# Patient Record
Sex: Male | Born: 1950 | Race: White | Hispanic: No | Smoking: Former smoker
Health system: Southern US, Community
[De-identification: ages and names within clinical notes are randomized; demographics above are authoritative.]

## PROBLEM LIST (undated history)

## (undated) DIAGNOSIS — K219 Gastro-esophageal reflux disease without esophagitis: Secondary | ICD-10-CM

## (undated) DIAGNOSIS — N289 Disorder of kidney and ureter, unspecified: Secondary | ICD-10-CM

## (undated) DIAGNOSIS — M1990 Unspecified osteoarthritis, unspecified site: Secondary | ICD-10-CM

## (undated) DIAGNOSIS — Z87442 Personal history of urinary calculi: Secondary | ICD-10-CM

## (undated) DIAGNOSIS — J45909 Unspecified asthma, uncomplicated: Secondary | ICD-10-CM

## (undated) DIAGNOSIS — E785 Hyperlipidemia, unspecified: Secondary | ICD-10-CM

## (undated) DIAGNOSIS — I8390 Asymptomatic varicose veins of unspecified lower extremity: Secondary | ICD-10-CM

## (undated) HISTORY — PX: VARICOSE VEIN SURGERY: SHX832

## (undated) HISTORY — PX: VASECTOMY: SHX75

## (undated) HISTORY — PX: HERNIA REPAIR: SHX51

## (undated) HISTORY — PX: TONSILLECTOMY: SUR1361

---

## 2000-07-22 ENCOUNTER — Ambulatory Visit (HOSPITAL_COMMUNITY): Admission: RE | Admit: 2000-07-22 | Discharge: 2000-07-22 | Payer: Self-pay | Admitting: Gastroenterology

## 2002-03-12 ENCOUNTER — Emergency Department (HOSPITAL_COMMUNITY): Admission: EM | Admit: 2002-03-12 | Discharge: 2002-03-12 | Payer: Self-pay | Admitting: Emergency Medicine

## 2003-08-23 ENCOUNTER — Ambulatory Visit (HOSPITAL_COMMUNITY): Admission: RE | Admit: 2003-08-23 | Discharge: 2003-08-23 | Payer: Self-pay | Admitting: Family Medicine

## 2007-01-06 ENCOUNTER — Ambulatory Visit: Payer: Self-pay | Admitting: Vascular Surgery

## 2010-09-03 NOTE — Consult Note (Signed)
NEW PATIENT CONSULTATION   Ralph, [name]  DOB:  08/07/1950                                       01/06/2007  ZOXWR#:[id number]   Ralph [name] presents today for continued evaluation of his venous  pathology.  He was seen in our office by Dr. Fabienne [name] in June 2005  with an episode of superficial thrombophlebitis in his left calf.  He  initially had no significant discomfort related to this but now is  having progressed marked discomfort in both lower extremities.  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  This is interfering with his  ability to work.  He reports that he also has a separate symptom of  burning and pins and needles sensation in the soles of his feet, but it  is worse when he first awakens in the morning.  I discussed this with  him and explained that this would not be typical of venous disease and  would not suspect improvement in this sensation with treatment.  He does  have edema in both ankles and does not have any ulceration and does have  a history of superficial thrombophlebitis in his left calf.  He elevates  his legs when possible and does take aspirin for pain.  His medical  history is otherwise unremarkable.  He does not have any current  medications and no major medical difficulties.  He does have a history  of abdominal hernia repair.   FAMILY HISTORY:  Significant for varicose veins with bilateral stripping  in his mother.   SOCIAL HISTORY:  He is single with one child.  He works as a Production designer, theatre/television/film.  Does smoke half pack of cigarettes per day.  Does not drink alcohol on a  regular basis.   REVIEW OF SYSTEMS:  Completely negative except for the leg symptoms we  have described.   PHYSICAL EXAMINATION:  Well-developed, well-nourished white male  appearing his stated age of 84.   Blood pressure is 138/89.  Pulse 78.  Respirations 16.  His radial pulses are 2+ bilaterally.  He has 2+  posterior tibial pulses bilaterally.  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  He does not have any skin changes.  He underwent a  handheld duplex by me showing reflux in the saphenous veins bilaterally.  I discussed options with Ralph [name].  I have recommended that we  proceed with placement of thigh-high20-30 mmhg  compression stockings to  determine if this will give him adequate relief of his venous  hypertension.  I plan to see him again in three months with formal  venous duplex at that time for continued evaluation as well.  He  understands treatment of laser ablation and stab phlebectomy, and we  will recommend this should he fail conservative treatment.   Ralph [name], M.D.  Electronically Signed   TFE/MEDQ  D:  01/06/2007  T:  01/07/2007  Job:  [id number]   cc:   Ralph [name], M.D.

## 2010-09-06 NOTE — Procedures (Signed)
[facility]  Patient:    Ralph [name], Ralph [name]              MRN: [id number] Proc. Date: 07/22/00 Attending:  Verlin Grills, M.D. CC:         Vikki Ports, M.D.                           Procedure Report  PROCEDURE:  Colonoscopy and polypectomy.  INDICATION:  Ralph [name] (date of birth 1950/04/25) is a 60 year old male.  Ralph [name] has recovered from a bout of diverticulitis. He also reports intermittent hematochezia.  I discussed with Ralph [name] the complications associated with colonoscopy and polypectomy including intestinal bleeding and intestinal perforation.  Ralph [name] has signed the operative permit.  ENDOSCOPIST:  Verlin Grills, M.D.  PREMEDICATION:  Versed 10 mg, Demerol 100 mg.  ENDOSCOPE:  Olympus pediatric colonoscope  DESCRIPTION OF PROCEDURE:  After obtaining informed consent, the patient was placed in the left lateral decubitus position.  I administered intravenous Demerol and intravenous Versed to achieve conscious sedation for the procedure.  The patients blood pressure, oxygen saturation, and cardiac rhythm were monitored throughout the procedure and documented in the medical record.  Anal inspection was normal.  Digital rectal examination was normal.  The prostate was non-nodular.  The Olympus pediatric video colonoscope was introduced into the rectum and, under direct vision, advanced to the cecum as identified by normal-appearing ileocecal valve.  The ileocecal valve was intubated and the distal ileum inspected.  Colonic preparation for the exam today was satisfactory.  Rectum: Large, nonbleeding internal hemorrhoids.  No rectal neoplasia noted. Sigmoid Colon and Descending Colon: Left colonic diverticulosis without diverticulitis or stricture.  Splenic Flexure: Normal.  Transverse Colon: Normal.  Hepatic Flexure: Normal.  Ascending Colon: Normal.  Cecum and Ileocecal Valve:  Normal.  ASSESSMENT: 1. Large, nonbleeding intgernal hemorrhoids. 2. Left colonic diverticulosis. 3. No endoscopic evidence for the presence of colorectal neoplasia. DD:  07/22/00 TD:  07/22/00 Job: [id number]

## 2011-01-14 ENCOUNTER — Emergency Department (HOSPITAL_COMMUNITY)
Admission: EM | Admit: 2011-01-14 | Discharge: 2011-01-14 | Disposition: A | Discharge status: Home / Self Care | Payer: PRIVATE HEALTH INSURANCE | Attending: Emergency Medicine | Admitting: Emergency Medicine

## 2011-01-14 DIAGNOSIS — M7989 Other specified soft tissue disorders: Secondary | ICD-10-CM | POA: Insufficient documentation

## 2011-01-14 DIAGNOSIS — T63461A Toxic effect of venom of wasps, accidental (unintentional), initial encounter: Secondary | ICD-10-CM | POA: Insufficient documentation

## 2011-01-14 DIAGNOSIS — T6391XA Toxic effect of contact with unspecified venomous animal, accidental (unintentional), initial encounter: Secondary | ICD-10-CM | POA: Insufficient documentation

## 2011-03-20 ENCOUNTER — Emergency Department (HOSPITAL_COMMUNITY)
Admission: EM | Admit: 2011-03-20 | Discharge: 2011-03-20 | Disposition: A | Discharge status: Home / Self Care | Payer: PRIVATE HEALTH INSURANCE | Attending: Emergency Medicine | Admitting: Emergency Medicine

## 2011-03-20 ENCOUNTER — Encounter: Payer: Self-pay | Admitting: Emergency Medicine

## 2011-03-20 ENCOUNTER — Emergency Department (HOSPITAL_COMMUNITY): Payer: PRIVATE HEALTH INSURANCE

## 2011-03-20 ENCOUNTER — Other Ambulatory Visit: Payer: Self-pay

## 2011-03-20 DIAGNOSIS — K219 Gastro-esophageal reflux disease without esophagitis: Secondary | ICD-10-CM | POA: Insufficient documentation

## 2011-03-20 DIAGNOSIS — R109 Unspecified abdominal pain: Secondary | ICD-10-CM | POA: Insufficient documentation

## 2011-03-20 DIAGNOSIS — R110 Nausea: Secondary | ICD-10-CM | POA: Insufficient documentation

## 2011-03-20 DIAGNOSIS — N201 Calculus of ureter: Secondary | ICD-10-CM | POA: Insufficient documentation

## 2011-03-20 HISTORY — DX: Gastro-esophageal reflux disease without esophagitis: K21.9

## 2011-03-20 LAB — URINALYSIS, ROUTINE W REFLEX MICROSCOPIC
Bilirubin Urine: NEGATIVE
Nitrite: NEGATIVE
Protein, ur: NEGATIVE mg/dL
Urobilinogen, UA: 0.2 mg/dL (ref 0.0–1.0)

## 2011-03-20 LAB — COMPREHENSIVE METABOLIC PANEL
ALT: 46 U/L (ref 0–53)
Alkaline Phosphatase: 59 U/L (ref 39–117)
BUN: 13 mg/dL (ref 6–23)
CO2: 24 mEq/L (ref 19–32)
Calcium: 8.9 mg/dL (ref 8.4–10.5)
GFR calc Af Amer: 75 mL/min — ABNORMAL LOW (ref >90)
GFR calc non Af Amer: 65 mL/min — ABNORMAL LOW (ref >90)
Glucose, Bld: 129 mg/dL — ABNORMAL HIGH (ref 70–99)
Sodium: 139 mEq/L (ref 135–145)

## 2011-03-20 LAB — CBC
HCT: 43 % (ref 39.0–52.0)
Hemoglobin: 14.9 g/dL (ref 13.0–17.0)
MCH: 35 pg — ABNORMAL HIGH (ref 26.0–34.0)
MCHC: 34.7 g/dL (ref 30.0–36.0)
RBC: 4.26 MIL/uL (ref 4.22–5.81)

## 2011-03-20 LAB — LIPASE, BLOOD: Lipase: 31 U/L (ref 11–59)

## 2011-03-20 MED ORDER — ONDANSETRON HCL 4 MG/2ML IJ SOLN
4.0000 mg | Freq: Once | INTRAMUSCULAR | Status: AC
  Administered 2011-03-20: 4 mg via INTRAVENOUS
  Filled 2011-03-20: qty 2

## 2011-03-20 MED ORDER — ONDANSETRON HCL 4 MG/2ML IJ SOLN
INTRAMUSCULAR | Status: AC
  Administered 2011-03-20: 4 mg via INTRAVENOUS
  Filled 2011-03-20: qty 2

## 2011-03-20 MED ORDER — IBUPROFEN 600 MG PO TABS: 600.0000 mg | ORAL_TABLET | Freq: Three times a day (TID) | ORAL | Status: DC | PRN

## 2011-03-20 MED ORDER — HYDROMORPHONE HCL PF 1 MG/ML IJ SOLN
INTRAMUSCULAR | Status: AC
  Administered 2011-03-20: 1 mg
  Filled 2011-03-20: qty 1

## 2011-03-20 MED ORDER — ONDANSETRON HCL 4 MG/2ML IJ SOLN
4.0000 mg | Freq: Once | INTRAMUSCULAR | Status: AC
  Administered 2011-03-20: 4 mg via INTRAVENOUS

## 2011-03-20 MED ORDER — OXYCODONE-ACETAMINOPHEN 5-325 MG PO TABS: 1.0000 | ORAL_TABLET | ORAL | Status: AC | PRN

## 2011-03-20 MED ORDER — HYDROMORPHONE HCL PF 2 MG/ML IJ SOLN: 2.0000 mg | Freq: Once | INTRAMUSCULAR | Status: DC

## 2011-03-20 MED ORDER — HYDROMORPHONE HCL PF 2 MG/ML IJ SOLN
2.0000 mg | Freq: Once | INTRAMUSCULAR | Status: AC
  Administered 2011-03-20: 2 mg via INTRAVENOUS
  Filled 2011-03-20: qty 1

## 2011-03-20 MED ORDER — IOHEXOL 300 MG/ML  SOLN
100.0000 mL | Freq: Once | INTRAMUSCULAR | Status: AC | PRN
  Administered 2011-03-20: 100 mL via INTRAVENOUS

## 2011-03-20 MED ORDER — SODIUM CHLORIDE 0.9 % IV BOLUS (SEPSIS)
1000.0000 mL | Freq: Once | INTRAVENOUS | Status: AC
  Administered 2011-03-20: 1000 mL via INTRAVENOUS

## 2011-03-20 MED ORDER — TAMSULOSIN HCL 0.4 MG PO CAPS: 0.4000 mg | ORAL_CAPSULE | Freq: Every day | ORAL | Status: DC

## 2011-03-20 MED ORDER — KETOROLAC TROMETHAMINE 30 MG/ML IJ SOLN
30.0000 mg | Freq: Once | INTRAMUSCULAR | Status: AC
  Administered 2011-03-20: 30 mg via INTRAVENOUS
  Filled 2011-03-20: qty 1

## 2011-03-20 NOTE — ED Notes (Signed)
Notified pt that we needed urine sample

## 2011-03-20 NOTE — ED Notes (Signed)
Pt has had n/v with abd pain around 9pm. Pt has had some constipation with little BMs noted. Pt pain cont. To increase with tenderness noted. Pt pain is 2in above navel. No mass noted.

## 2011-03-20 NOTE — ED Notes (Signed)
Notified CT scan that pt is finished with contrast

## 2011-03-20 NOTE — ED Provider Notes (Signed)
History     CSN: [id number] Arrival date & time: 03/20/2011 12:58 AM   First MD Initiated Contact with Patient 03/20/11 0101      Chief Complaint  Patient presents with  . Abdominal Pain    (Consider location/radiation/quality/duration/timing/severity/associated sxs/prior treatment) Patient is a 60 y.o. male presenting with abdominal pain. The history is provided by the patient.  Abdominal Pain The primary symptoms of the illness include abdominal pain.   patient reports acute onset abdominal pain that began this evening at approximately 9 PM.  He reports he was in his upper abdomen.  Many nauseated however he did not vomit.  She denies diarrhea.  He reports ongoing pain in his abdomen it comes in waves.  He also reports new discomfort with urination.  He denies back pain flank pain.  He has undergone a recent manipulation of his back after a fall approximately 5 days ago.  Reports he's never had abdominal pain like this before.  Nothing seems to worsen.  Nothing improves his pain.  His symptoms are constant.  He denies fevers and chills.  He denies chest pain shortness of breath.  He's never had abdominal surgery  Past Medical History  Diagnosis Date  . GERD (gastroesophageal reflux disease)     History reviewed. No pertinent past surgical history.  History reviewed. No pertinent family history.  History  Substance Use Topics  . Smoking status: Not on file  . Smokeless tobacco: Not on file  . Alcohol Use:       Review of Systems  Gastrointestinal: Positive for abdominal pain.  All other systems reviewed and are negative.    Allergies  Morphine and related  Home Medications   Current Outpatient Rx  Name Route Sig Dispense Refill  . CIMETIDINE 400 MG PO TABS Oral Take 400 mg by mouth 2 (two) times daily.        BP 148/89  Pulse 84  Temp(Src) 97.7 F (36.5 C) (Oral)  Resp 14  Ht 6\' 1"  (1.854 m)  Wt 195 lb (88.451 kg)  BMI 25.73 kg/m2  SpO2 96%  Physical  Exam  Nursing note and vitals reviewed. Constitutional: He is oriented to person, place, and time. He appears well-developed and well-nourished.  HENT:  Head: Normocephalic and atraumatic.  Eyes: EOM are normal.  Neck: Normal range of motion.  Cardiovascular: Normal rate, regular rhythm, normal heart sounds and intact distal pulses.   Pulmonary/Chest: Effort normal and breath sounds normal. No respiratory distress.  Abdominal: Soft. He exhibits no distension. There is no rebound and no guarding.       Mild epigastric tenderness.  No tenderness of his right upper quadrant  Musculoskeletal: Normal range of motion.  Neurological: He is alert and oriented to person, place, and time.  Skin: Skin is warm and dry.  Psychiatric: He has a normal mood and affect. Judgment normal.    ED Course  [place] bedside Performed by: Lyanne Co Authorized by: Lyanne Co Comments: Bedside ultrasound of his abdominal aorta demonstrates normal caliber and both transverse and sagittal views from the diaphragm to the bifurcation.  Maximal diameter is 2.7 x 2.5 cm.  There is no evidence of abdominal aneurysm on bedside ultrasound.  Indication: abdominal pain.  Probe: Curvilinear.  Images unable to be archived   (including critical care time)  Date: 03/20/2011  Rate: 82  Rhythm: normal sinus rhythm  QRS Axis: normal  Intervals: normal  ST/T Wave abnormalities: normal  Conduction Disutrbances:none  Narrative Interpretation:  Old EKG Reviewed: No prior EKG available    Labs Reviewed  CBC - Abnormal; Notable for the following:    WBC 15.3 (*)    MCV 100.9 (*)    MCH 35.0 (*)    All other components within normal limits  COMPREHENSIVE METABOLIC PANEL - Abnormal; Notable for the following:    Potassium 3.4 (*)    Glucose, Bld 129 (*)    AST 46 (*)    GFR calc non Af Amer 65 (*)    GFR calc Af Amer 75 (*)    All other components within normal limits  URINALYSIS, ROUTINE W REFLEX MICROSCOPIC -  Abnormal; Notable for the following:    Hgb urine dipstick SMALL (*)    Ketones, ur 15 (*)    All other components within normal limits  LIPASE, BLOOD  TROPONIN I  URINE MICROSCOPIC-ADD ON  URINE CULTURE   Ct Abdomen Pelvis W Contrast  03/20/2011  *RADIOLOGY REPORT*  Clinical Data: Abdominal pain, back pain, nausea and vomiting.  CT ABDOMEN AND PELVIS WITH CONTRAST  Technique:  Multidetector CT imaging of the abdomen and pelvis was performed following the standard protocol during bolus administration of intravenous contrast.  Contrast: OMNIPAQUE IOHEXOL 300 MG/ML IV SOLN  Comparison: None.  Findings: Bibasilar airspace opacities likely reflect atelectasis.  The liver and spleen are unremarkable in appearance.  The gallbladder is within normal limits.  The pancreas and adrenal glands are unremarkable.  There is mild right-sided hydronephrosis, with significant right- sided perinephric stranding and fluid, and prominence of the right ureter to the level of an obstructing distal 3 mm stone at the right vesicoureteral junction.  The degree of perinephric stranding and mild decrease in right renal enhancement raises question for mild pyelonephritis.  A 2 mm stone is noted at the interpole region of the right kidney.  No additional nonobstructing renal stones are seen.  Minimal nonspecific left-sided perinephric stranding is seen.  No free fluid is identified.  The small bowel is unremarkable in appearance.  The stomach is filled with contrast and is within normal limits.  No acute vascular abnormalities are seen.  Minimal scattered calcification is noted along the abdominal aorta and its branches.  The appendix in caliber and contains air, without evidence for appendicitis.  Diffuse diverticulosis is noted along the entirety of the colon, with relative sparing of the transverse colon.  There is no evidence of acute diverticulitis.  The bladder is mildly distended and grossly unremarkable in appearance.   There is slight impression on the base of the bladder by the prostate; the prostate is borderline prominent, measuring 4.8 cm in transverse dimension.  Scattered calcifications along the left inguinal region may reflect prior surgery.  No inguinal lymphadenopathy is seen.  No acute osseous abnormalities are identified.  IMPRESSION:  1.  Mild right-sided hydronephrosis, with an obstructing 3 mm stone seen distally at the right vesicoureteral junction.  2.  Significant right-sided perinephric stranding and fluid, and mild decrease in right renal enhancement, raising question for mild pyelonephritis. 3.  2 mm nonobstructing stone noted at the interpole region of the right kidney. 4.  Diffuse diverticulosis noted along the entirety of the colon, with mild sparing along the transverse colon; no evidence of diverticulitis. 5.  Borderline prominence of the prostate. 6.  Bibasilar airspace opacities likely reflect atelectasis.  Original Report Authenticated By: Tonia Ghent, M.D.   I personally reviewed his CT scan  1. Right ureteral stone  MDM  The patient does drink 1-2 beers daily.  Given his upper abdominal pain and concern for possible pancreatitis was present.  His lipase is normal.  His LFTs are normal.  His EKG is normal sinus rhythm.  Doubt ACS or PE.  CT scan pending at this time to further evaluate.  Concerns include transverse colonic diverticulitis versus cholelithiasis versus gastritis versus peptic ulcer disease versus perforated ulcer.  .5:00 AM The patient feels much better at this time.  Adjust oral be given.  His had no fever or chills.  Urine cultures been sent.  Close followup with the urologist.        Lyanne Co, MD 03/20/11 [phone]

## 2011-03-20 NOTE — ED Notes (Signed)
Patient transported to CT 

## 2011-03-21 ENCOUNTER — Observation Stay (HOSPITAL_COMMUNITY)
Admission: EM | Admit: 2011-03-21 | Discharge: 2011-03-22 | Disposition: A | Discharge status: Home / Self Care | Payer: PRIVATE HEALTH INSURANCE | Attending: Emergency Medicine | Admitting: Emergency Medicine

## 2011-03-21 ENCOUNTER — Encounter (HOSPITAL_COMMUNITY): Payer: Self-pay | Admitting: Emergency Medicine

## 2011-03-21 DIAGNOSIS — Z87442 Personal history of urinary calculi: Secondary | ICD-10-CM | POA: Insufficient documentation

## 2011-03-21 DIAGNOSIS — N200 Calculus of kidney: Secondary | ICD-10-CM

## 2011-03-21 DIAGNOSIS — N1330 Unspecified hydronephrosis: Secondary | ICD-10-CM | POA: Insufficient documentation

## 2011-03-21 DIAGNOSIS — N201 Calculus of ureter: Principal | ICD-10-CM | POA: Insufficient documentation

## 2011-03-21 DIAGNOSIS — R1031 Right lower quadrant pain: Secondary | ICD-10-CM | POA: Insufficient documentation

## 2011-03-21 DIAGNOSIS — N23 Unspecified renal colic: Secondary | ICD-10-CM

## 2011-03-21 LAB — DIFFERENTIAL
Basophils Absolute: 0 K/uL (ref 0.0–0.1)
Basophils Relative: 0 % (ref 0–1)
Eosinophils Absolute: 0 10*3/uL (ref 0.0–0.7)
Eosinophils Relative: 0 % (ref 0–5)
Lymphocytes Relative: 8 % — ABNORMAL LOW (ref 12–46)
Lymphs Abs: 1.2 10*3/uL (ref 0.7–4.0)
Monocytes Absolute: 1.2 K/uL — ABNORMAL HIGH (ref 0.1–1.0)
Monocytes Relative: 8 % (ref 3–12)
Neutro Abs: 13.4 K/uL — ABNORMAL HIGH (ref 1.7–7.7)
Neutrophils Relative %: 85 % — ABNORMAL HIGH (ref 43–77)

## 2011-03-21 LAB — URINALYSIS, ROUTINE W REFLEX MICROSCOPIC
Bilirubin Urine: NEGATIVE
Glucose, UA: NEGATIVE mg/dL
Ketones, ur: NEGATIVE mg/dL
Leukocytes, UA: NEGATIVE
Nitrite: NEGATIVE
Protein, ur: NEGATIVE mg/dL
Specific Gravity, Urine: 1.015 (ref 1.005–1.030)
Urobilinogen, UA: 0.2 mg/dL (ref 0.0–1.0)
pH: 5 (ref 5.0–8.0)

## 2011-03-21 LAB — BASIC METABOLIC PANEL WITH GFR
BUN: 13 mg/dL (ref 6–23)
CO2: 28 meq/L (ref 19–32)
Chloride: 101 meq/L (ref 96–112)
Creatinine, Ser: 1.53 mg/dL — ABNORMAL HIGH (ref 0.50–1.35)
GFR calc Af Amer: 55 mL/min — ABNORMAL LOW (ref >90)
Potassium: 3.9 meq/L (ref 3.5–5.1)

## 2011-03-21 LAB — URINE MICROSCOPIC-ADD ON

## 2011-03-21 LAB — BASIC METABOLIC PANEL
Calcium: 9.1 mg/dL (ref 8.4–10.5)
GFR calc non Af Amer: 48 mL/min — ABNORMAL LOW (ref >90)
Glucose, Bld: 146 mg/dL — ABNORMAL HIGH (ref 70–99)
Sodium: 139 mEq/L (ref 135–145)

## 2011-03-21 LAB — URINE CULTURE
Colony Count: NO GROWTH
Culture: NO GROWTH

## 2011-03-21 LAB — CBC
HCT: 41.6 % (ref 39.0–52.0)
Hemoglobin: 14.7 g/dL (ref 13.0–17.0)
MCH: 35.6 pg — ABNORMAL HIGH (ref 26.0–34.0)
MCHC: 35.3 g/dL (ref 30.0–36.0)
MCV: 100.7 fL — ABNORMAL HIGH (ref 78.0–100.0)
Platelets: 191 10*3/uL (ref 150–400)
RBC: 4.13 MIL/uL — ABNORMAL LOW (ref 4.22–5.81)
RDW: 12.5 % (ref 11.5–15.5)
WBC: 15.8 K/uL — ABNORMAL HIGH (ref 4.0–10.5)

## 2011-03-21 MED ORDER — MAGNESIUM HYDROXIDE 400 MG/5ML PO SUSP
ORAL | Status: AC
  Filled 2011-03-21: qty 30

## 2011-03-21 MED ORDER — HYDROMORPHONE HCL PF 1 MG/ML IJ SOLN
1.0000 mg | Freq: Once | INTRAMUSCULAR | Status: AC
  Administered 2011-03-21: 1 mg via INTRAVENOUS
  Filled 2011-03-21: qty 1

## 2011-03-21 MED ORDER — ONDANSETRON HCL 4 MG/2ML IJ SOLN
INTRAMUSCULAR | Status: AC
  Filled 2011-03-21: qty 2

## 2011-03-21 MED ORDER — HYDROMORPHONE HCL PF 1 MG/ML IJ SOLN
INTRAMUSCULAR | Status: AC
  Filled 2011-03-21: qty 1

## 2011-03-21 MED ORDER — SODIUM CHLORIDE 0.9 % IV SOLN
Freq: Once | INTRAVENOUS | Status: AC
  Administered 2011-03-21: 15:00:00 via INTRAVENOUS

## 2011-03-21 MED ORDER — HYDROMORPHONE HCL PF 1 MG/ML IJ SOLN
1.0000 mg | INTRAMUSCULAR | Status: DC | PRN
  Administered 2011-03-21: 1 mg via INTRAVENOUS
  Filled 2011-03-21: qty 1

## 2011-03-21 MED ORDER — KETOROLAC TROMETHAMINE 30 MG/ML IJ SOLN
15.0000 mg | Freq: Once | INTRAMUSCULAR | Status: AC
  Administered 2011-03-21: 15 mg via INTRAVENOUS
  Filled 2011-03-21: qty 1

## 2011-03-21 MED ORDER — HYDROMORPHONE HCL PF 1 MG/ML IJ SOLN
1.0000 mg | Freq: Once | INTRAMUSCULAR | Status: AC
  Administered 2011-03-21: 1 mg via INTRAVENOUS

## 2011-03-21 MED ORDER — ONDANSETRON HCL 4 MG/2ML IJ SOLN
4.0000 mg | Freq: Once | INTRAMUSCULAR | Status: AC
  Administered 2011-03-21: 4 mg via INTRAVENOUS
  Filled 2011-03-21: qty 2

## 2011-03-21 MED ORDER — MAGNESIUM HYDROXIDE NICU ORAL SYRINGE 400 MG/5 ML: 30.0000 mL | Freq: Once | ORAL | Status: DC

## 2011-03-21 MED ORDER — ONDANSETRON HCL 4 MG/2ML IJ SOLN
4.0000 mg | Freq: Once | INTRAMUSCULAR | Status: AC
  Administered 2011-03-21: 4 mg via INTRAMUSCULAR

## 2011-03-21 NOTE — ED Notes (Signed)
Discharged yesterday for kidney stones. Reports awoke this am with worse pain & now in Right flank. Upon discharge pain was in RLQ

## 2011-03-21 NOTE — ED Notes (Signed)
Last percocet at [employer]

## 2011-03-21 NOTE — ED Provider Notes (Signed)
6:00 PM Pt given another dilaudid. Managing pt pain. Otherwise pt has no complaints and still hemodynamically stable. VS reviewed Elveria Royals, hypertensive).   10:43 PM Pt concerned to go home d/t pain w passing stone. Discussed w Dr. Oletta Lamas who agrees with plan. Pt will stay in CDU over night on prn medication q4hrs to be re-evaluated and dc in the am by CDU a shift.   [place], [place] 03/21/11 2244

## 2011-03-21 NOTE — ED Notes (Signed)
Patients daughter left a number to contact her at discharge [phone]-[place]

## 2011-03-21 NOTE — ED Provider Notes (Signed)
History     CSN: [id number] Arrival date & time: 03/21/2011  2:13 PM   First MD Initiated Contact with Patient 03/21/11 1512      Chief Complaint  Patient presents with  . Flank Pain    (Consider location/radiation/quality/duration/timing/severity/associated sxs/prior treatment) HPI Comments: Patient reports that he was seen here yesterday for a right-sided lower abdominal pain and found to have a 3 mm ureteral stone. He was treated with IV medications and felt that his symptoms were improved. At home he was doing fairly well and has not noticed if the stone has passed. Shortly prior to arrival pain markedly got worse and this time the pain is more in the right flank region. He has not had a rash. He does have some nausea and no vomiting. He has had a history of stones in the past as well. He is currently allergic to morphine which caused a rash. At home he has been taking Flomax as well as Percocet. Patient reports that the pain is waxing and waning and does get very severe intents to 10 out of 10 before starting to ease off to a slightly more tolerable level. Pain is persistent however. He denies any fevers or chills. He denies any recent trauma. She has not had an appendectomy in the past. Level 5 caveat due to severe pain.  Patient is a 60 y.o. male presenting with flank pain. The history is provided by the patient and a relative.  Flank Pain    Past Medical History  Diagnosis Date  . GERD (gastroesophageal reflux disease)     History reviewed. No pertinent past surgical history.  History reviewed. No pertinent family history.  History  Substance Use Topics  . Smoking status: Current Everyday Smoker  . Smokeless tobacco: Not on file  . Alcohol Use: Yes      Review of Systems  Unable to perform ROS: Other  Genitourinary: Positive for flank pain.    Allergies  Morphine and related  Home Medications   Current Outpatient Rx  Name Route Sig Dispense Refill  .  CIMETIDINE 400 MG PO TABS Oral Take 400 mg by mouth 2 (two) times daily.      . OXYCODONE-ACETAMINOPHEN 5-325 MG PO TABS Oral Take 1 tablet by mouth every 4 (four) hours as needed for pain. 25 tablet 0  . TAMSULOSIN HCL 0.4 MG PO CAPS Oral Take 1 capsule (0.4 mg total) by mouth daily. 10 capsule 0    BP 164/83  Pulse 85  Temp(Src) 98 F (36.7 C) (Oral)  Resp 21  SpO2 95%  Physical Exam  Nursing note and vitals reviewed. Constitutional: He is oriented to person, place, and time. He appears well-developed and well-nourished. He appears distressed.  HENT:  Head: Normocephalic and atraumatic.  Eyes: No scleral icterus.  Pulmonary/Chest: Effort normal.  Abdominal: Soft. He exhibits no distension. There is tenderness in the right lower quadrant. There is CVA tenderness. There is no rebound.    Musculoskeletal: Normal range of motion.  Neurological: He is alert and oriented to person, place, and time.  Skin: Skin is warm. No rash noted.  Psychiatric: He has a normal mood and affect.    ED Course  Procedures (including critical care time)   Labs Reviewed  URINALYSIS, ROUTINE W REFLEX MICROSCOPIC  CBC  DIFFERENTIAL  BASIC METABOLIC PANEL   Ct Abdomen Pelvis W Contrast  03/20/2011  *RADIOLOGY REPORT*  Clinical Data: Abdominal pain, back pain, nausea and vomiting.  CT ABDOMEN AND PELVIS  WITH CONTRAST  Technique:  Multidetector CT imaging of the abdomen and pelvis was performed following the standard protocol during bolus administration of intravenous contrast.  Contrast: OMNIPAQUE IOHEXOL 300 MG/ML IV SOLN  Comparison: None.  Findings: Bibasilar airspace opacities likely reflect atelectasis.  The liver and spleen are unremarkable in appearance.  The gallbladder is within normal limits.  The pancreas and adrenal glands are unremarkable.  There is mild right-sided hydronephrosis, with significant right- sided perinephric stranding and fluid, and prominence of the right ureter to the  level of an obstructing distal 3 mm stone at the right vesicoureteral junction.  The degree of perinephric stranding and mild decrease in right renal enhancement raises question for mild pyelonephritis.  A 2 mm stone is noted at the interpole region of the right kidney.  No additional nonobstructing renal stones are seen.  Minimal nonspecific left-sided perinephric stranding is seen.  No free fluid is identified.  The small bowel is unremarkable in appearance.  The stomach is filled with contrast and is within normal limits.  No acute vascular abnormalities are seen.  Minimal scattered calcification is noted along the abdominal aorta and its branches.  The appendix in caliber and contains air, without evidence for appendicitis.  Diffuse diverticulosis is noted along the entirety of the colon, with relative sparing of the transverse colon.  There is no evidence of acute diverticulitis.  The bladder is mildly distended and grossly unremarkable in appearance.  There is slight impression on the base of the bladder by the prostate; the prostate is borderline prominent, measuring 4.8 cm in transverse dimension.  Scattered calcifications along the left inguinal region may reflect prior surgery.  No inguinal lymphadenopathy is seen.  No acute osseous abnormalities are identified.  IMPRESSION:  1.  Mild right-sided hydronephrosis, with an obstructing 3 mm stone seen distally at the right vesicoureteral junction.  2.  Significant right-sided perinephric stranding and fluid, and mild decrease in right renal enhancement, raising question for mild pyelonephritis. 3.  2 mm nonobstructing stone noted at the interpole region of the right kidney. 4.  Diffuse diverticulosis noted along the entirety of the colon, with mild sparing along the transverse colon; no evidence of diverticulitis. 5.  Borderline prominence of the prostate. 6.  Bibasilar airspace opacities likely reflect atelectasis.  Original Report Authenticated By: Tonia Ghent, M.D.     No diagnosis found.    MDM   I reviewed the patient's CT scan from yesterday. Also reviewed his urine culture from yesterday which shows no growth. The CT scan mentioned some perinephric stranding worrisome for pyelonephritis. However his urinalysis and urine culture were not suggestive of any current urinary tract infection. The stone was 3 mm and was right at the right UVJ region. He may still be having residual pain from ureteral spasms. His white count yesterday was 15, and his basic metabolic panel was unremarkable. He also had a lipase which was normal. By report his EKG was seen and did not show any ischemic changes. My plan is to continue IV fluids, IV analgesics and antiemetics. We'll give a small dose of IV Toradol. We'll place him on renal colic protocol in place him in the CDU for a slightly more prolonged course of treatment for his symptoms.        Gavin Pound. Oletta Lamas, MD 03/22/11 [phone]

## 2011-03-21 NOTE — ED Notes (Signed)
Pt complains of right flank pain returning

## 2011-03-21 NOTE — ED Notes (Signed)
States he is here for pain associated with kidney stones.  Rates pain as 7/10.

## 2011-03-21 NOTE — ED Notes (Signed)
Pt c/o right sided flank pain starting today; pt sts was seen here on 29th and diagnosed with kidney stone; pt sts pain increased today and has min urine output

## 2011-03-22 NOTE — ED Provider Notes (Signed)
Medical screening examination/treatment/procedure(s) were performed by non-physician practitioner and as supervising physician I was immediately available for consultation/collaboration.  Nicholes Stairs, MD 03/22/11 [phone]

## 2011-03-22 NOTE — ED Provider Notes (Signed)
2:15 AM patient wishes to go home he is comfortable at present. Instructed to followup with [facility] 02/22/2011  Doug Sou, MD 03/22/11 [phone]

## 2011-03-23 LAB — URINE CULTURE
Colony Count: NO GROWTH
Culture  Setup Time: 201212012012
Culture: NO GROWTH

## 2012-06-28 ENCOUNTER — Emergency Department (HOSPITAL_COMMUNITY)
Admission: EM | Admit: 2012-06-28 | Discharge: 2012-06-28 | Disposition: A | Discharge status: Home / Self Care | Payer: 59 | Attending: Emergency Medicine | Admitting: Emergency Medicine

## 2012-06-28 ENCOUNTER — Emergency Department (HOSPITAL_COMMUNITY): Payer: 59

## 2012-06-28 DIAGNOSIS — S058X9A Other injuries of unspecified eye and orbit, initial encounter: Secondary | ICD-10-CM | POA: Insufficient documentation

## 2012-06-28 DIAGNOSIS — S335XXA Sprain of ligaments of lumbar spine, initial encounter: Secondary | ICD-10-CM | POA: Insufficient documentation

## 2012-06-28 DIAGNOSIS — S0993XA Unspecified injury of face, initial encounter: Secondary | ICD-10-CM | POA: Insufficient documentation

## 2012-06-28 DIAGNOSIS — Z79899 Other long term (current) drug therapy: Secondary | ICD-10-CM | POA: Insufficient documentation

## 2012-06-28 DIAGNOSIS — S161XXA Strain of muscle, fascia and tendon at neck level, initial encounter: Secondary | ICD-10-CM

## 2012-06-28 DIAGNOSIS — S0180XA Unspecified open wound of other part of head, initial encounter: Secondary | ICD-10-CM | POA: Insufficient documentation

## 2012-06-28 DIAGNOSIS — S39012A Strain of muscle, fascia and tendon of lower back, initial encounter: Secondary | ICD-10-CM

## 2012-06-28 DIAGNOSIS — S139XXA Sprain of joints and ligaments of unspecified parts of neck, initial encounter: Secondary | ICD-10-CM | POA: Insufficient documentation

## 2012-06-28 DIAGNOSIS — F17200 Nicotine dependence, unspecified, uncomplicated: Secondary | ICD-10-CM | POA: Insufficient documentation

## 2012-06-28 DIAGNOSIS — S0502XA Injury of conjunctiva and corneal abrasion without foreign body, left eye, initial encounter: Secondary | ICD-10-CM

## 2012-06-28 DIAGNOSIS — H538 Other visual disturbances: Secondary | ICD-10-CM | POA: Insufficient documentation

## 2012-06-28 DIAGNOSIS — Y92410 Unspecified street and highway as the place of occurrence of the external cause: Secondary | ICD-10-CM | POA: Insufficient documentation

## 2012-06-28 DIAGNOSIS — S0181XA Laceration without foreign body of other part of head, initial encounter: Secondary | ICD-10-CM

## 2012-06-28 DIAGNOSIS — IMO0002 Reserved for concepts with insufficient information to code with codable children: Secondary | ICD-10-CM | POA: Insufficient documentation

## 2012-06-28 DIAGNOSIS — Z8719 Personal history of other diseases of the digestive system: Secondary | ICD-10-CM | POA: Insufficient documentation

## 2012-06-28 DIAGNOSIS — Y93I9 Activity, other involving external motion: Secondary | ICD-10-CM | POA: Insufficient documentation

## 2012-06-28 MED ORDER — FLUORESCEIN SODIUM 1 MG OP STRP
1.0000 | ORAL_STRIP | Freq: Once | OPHTHALMIC | Status: AC
  Administered 2012-06-28: 1 via OPHTHALMIC
  Filled 2012-06-28: qty 1

## 2012-06-28 MED ORDER — OXYCODONE-ACETAMINOPHEN 5-325 MG PO TABS
2.0000 | ORAL_TABLET | Freq: Once | ORAL | Status: AC
  Administered 2012-06-28: 2 via ORAL
  Filled 2012-06-28: qty 2

## 2012-06-28 MED ORDER — CIPROFLOXACIN HCL 0.3 % OP SOLN
2.0000 [drp] | Freq: Once | OPHTHALMIC | Status: AC
  Administered 2012-06-28: 2 [drp] via OPHTHALMIC
  Filled 2012-06-28 (×2): qty 2.5

## 2012-06-28 MED ORDER — TETRACAINE HCL 0.5 % OP SOLN
2.0000 [drp] | Freq: Once | OPHTHALMIC | Status: AC
  Administered 2012-06-28: 2 [drp] via OPHTHALMIC
  Filled 2012-06-28: qty 2

## 2012-06-28 MED ORDER — FENTANYL CITRATE 0.05 MG/ML IJ SOLN
100.0000 ug | Freq: Once | INTRAMUSCULAR | Status: AC
  Administered 2012-06-28: 100 ug via INTRAMUSCULAR
  Filled 2012-06-28: qty 2

## 2012-06-28 MED ORDER — CYCLOBENZAPRINE HCL 10 MG PO TABS: 10.0000 mg | ORAL_TABLET | Freq: Two times a day (BID) | ORAL | Status: DC | PRN

## 2012-06-28 MED ORDER — OXYCODONE-ACETAMINOPHEN 5-325 MG PO TABS: 1.0000 | ORAL_TABLET | Freq: Four times a day (QID) | ORAL | Status: DC | PRN

## 2012-06-28 NOTE — ED Provider Notes (Signed)
Medical screening examination/treatment/procedure(s) were conducted as a shared visit with non-physician practitioner(s) and myself.  I personally evaluated the patient during the encounter  Toy Baker, MD 06/28/12 2327

## 2012-06-28 NOTE — ED Provider Notes (Signed)
Medical screening examination/treatment/procedure(s) were conducted as a shared visit with non-physician practitioner(s) and myself.  I personally evaluated the patient during the encounter  Patient seen examined. X-rays and labs reviewed. Chest and abdomen nontender to exam. He is stable for discharge  Toy Baker, MD 06/28/12 [phone]
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
## 2012-07-02 ENCOUNTER — Other Ambulatory Visit: Payer: Self-pay | Admitting: Family Medicine

## 2012-07-02 DIAGNOSIS — M79602 Pain in left arm: Secondary | ICD-10-CM

## 2012-07-05 ENCOUNTER — Ambulatory Visit
Admission: RE | Admit: 2012-07-05 | Discharge: 2012-07-05 | Disposition: A | Discharge status: Home / Self Care | Payer: No Typology Code available for payment source | Source: Ambulatory Visit | Attending: Family Medicine | Admitting: Family Medicine

## 2012-07-05 DIAGNOSIS — M79602 Pain in left arm: Secondary | ICD-10-CM

## 2012-07-21 ENCOUNTER — Other Ambulatory Visit: Payer: Self-pay | Admitting: Neurosurgery

## 2012-07-21 DIAGNOSIS — M542 Cervicalgia: Secondary | ICD-10-CM

## 2012-07-26 ENCOUNTER — Ambulatory Visit
Admission: RE | Admit: 2012-07-26 | Discharge: 2012-07-26 | Disposition: A | Discharge status: Home / Self Care | Payer: No Typology Code available for payment source | Source: Ambulatory Visit | Attending: Neurosurgery | Admitting: Neurosurgery

## 2012-07-26 VITALS — BP 138/76 | HR 76

## 2012-07-26 DIAGNOSIS — M542 Cervicalgia: Secondary | ICD-10-CM

## 2012-07-26 MED ORDER — DIAZEPAM 5 MG PO TABS
10.0000 mg | ORAL_TABLET | Freq: Once | ORAL | Status: AC
  Administered 2012-07-26: 10 mg via ORAL

## 2012-07-26 MED ORDER — HYDROMORPHONE HCL PF 2 MG/ML IJ SOLN
2.0000 mg | Freq: Once | INTRAMUSCULAR | Status: AC
  Administered 2012-07-26: 2 mg via INTRAMUSCULAR

## 2012-07-26 MED ORDER — ONDANSETRON HCL 4 MG/2ML IJ SOLN
4.0000 mg | Freq: Once | INTRAMUSCULAR | Status: AC
  Administered 2012-07-26: 4 mg via INTRAMUSCULAR

## 2012-07-26 MED ORDER — IOHEXOL 300 MG/ML  SOLN
10.0000 mL | Freq: Once | INTRAMUSCULAR | Status: AC | PRN
  Administered 2012-07-26: 10 mL via INTRATHECAL

## 2012-07-26 NOTE — Progress Notes (Addendum)
Discharge instructions explained to pt.  Donell Sievert, RN

## 2013-10-25 ENCOUNTER — Other Ambulatory Visit: Payer: Self-pay | Admitting: Family Medicine

## 2013-10-25 DIAGNOSIS — R0602 Shortness of breath: Secondary | ICD-10-CM

## 2013-10-26 ENCOUNTER — Ambulatory Visit (INDEPENDENT_AMBULATORY_CARE_PROVIDER_SITE_OTHER): Payer: 59 | Admitting: Internal Medicine

## 2013-10-26 DIAGNOSIS — R0602 Shortness of breath: Secondary | ICD-10-CM

## 2013-10-26 NOTE — Progress Notes (Signed)
PFT done today. 

## 2013-10-27 LAB — PULMONARY FUNCTION TEST
DL/VA % pred: 96 %
DL/VA: 4.5 ml/min/mmHg/L
DLCO UNC % PRED: 81 %
DLCO UNC: 27.33 ml/min/mmHg
FEF 25-75 PRE: 0.86 L/s
FEF 25-75 Post: 1.17 L/sec
FEF2575-%CHANGE-POST: 35 %
FEF2575-%Pred-Post: 40 %
FEF2575-%Pred-Pre: 29 %
FEV1-%CHANGE-POST: 12 %
FEV1-%Pred-Post: 58 %
FEV1-%Pred-Pre: 51 %
FEV1-Post: 2.13 L
FEV1-Pre: 1.89 L
FEV1FVC-%Change-Post: 6 %
FEV1FVC-%Pred-Pre: 73 %
FEV6-%Change-Post: 7 %
FEV6-%PRED-POST: 76 %
FEV6-%PRED-PRE: 70 %
FEV6-POST: 3.52 L
FEV6-PRE: 3.26 L
FEV6FVC-%CHANGE-POST: 1 %
FEV6FVC-%PRED-POST: 102 %
FEV6FVC-%PRED-PRE: 100 %
FVC-%Change-Post: 5 %
FVC-%PRED-POST: 74 %
FVC-%PRED-PRE: 70 %
FVC-POST: 3.63 L
FVC-Pre: 3.43 L
POST FEV6/FVC RATIO: 97 %
Post FEV1/FVC ratio: 59 %
Pre FEV1/FVC ratio: 55 %
Pre FEV6/FVC Ratio: 95 %
RV % pred: 109 %
RV: 2.59 L
TLC % PRED: 90 %
TLC: 6.52 L

## 2014-12-12 ENCOUNTER — Other Ambulatory Visit: Payer: Self-pay | Admitting: Family Medicine

## 2014-12-12 ENCOUNTER — Ambulatory Visit
Admission: RE | Admit: 2014-12-12 | Discharge: 2014-12-12 | Disposition: A | Discharge status: Home / Self Care | Payer: 59 | Source: Ambulatory Visit | Attending: Family Medicine | Admitting: Family Medicine

## 2014-12-12 DIAGNOSIS — R0609 Other forms of dyspnea: Principal | ICD-10-CM

## 2014-12-12 DIAGNOSIS — R0600 Dyspnea, unspecified: Secondary | ICD-10-CM

## 2015-01-21 NOTE — Progress Notes (Signed)
This encounter was created in error - please disregard.

## 2015-01-22 ENCOUNTER — Encounter: Payer: 59 | Admitting: Cardiovascular Disease

## 2015-03-14 ENCOUNTER — Other Ambulatory Visit: Payer: Self-pay | Admitting: Gastroenterology

## 2015-03-19 ENCOUNTER — Encounter (HOSPITAL_COMMUNITY): Payer: Self-pay | Admitting: *Deleted

## 2015-03-20 ENCOUNTER — Ambulatory Visit (HOSPITAL_COMMUNITY)
Admission: RE | Admit: 2015-03-20 | Discharge: 2015-03-20 | Disposition: A | Discharge status: Home / Self Care | Payer: 59 | Source: Ambulatory Visit | Attending: Gastroenterology | Admitting: Gastroenterology

## 2015-03-20 ENCOUNTER — Encounter (HOSPITAL_COMMUNITY)
Admission: RE | Disposition: A | Discharge status: Home / Self Care | Payer: Self-pay | Source: Ambulatory Visit | Attending: Gastroenterology

## 2015-03-20 ENCOUNTER — Ambulatory Visit (HOSPITAL_COMMUNITY): Payer: 59 | Admitting: Certified Registered Nurse Anesthetist

## 2015-03-20 ENCOUNTER — Ambulatory Visit (HOSPITAL_COMMUNITY): Admit: 2015-03-20 | Payer: Self-pay | Admitting: Gastroenterology

## 2015-03-20 ENCOUNTER — Encounter (HOSPITAL_COMMUNITY): Payer: Self-pay

## 2015-03-20 DIAGNOSIS — K5730 Diverticulosis of large intestine without perforation or abscess without bleeding: Secondary | ICD-10-CM | POA: Insufficient documentation

## 2015-03-20 DIAGNOSIS — J45909 Unspecified asthma, uncomplicated: Secondary | ICD-10-CM | POA: Diagnosis not present

## 2015-03-20 DIAGNOSIS — D125 Benign neoplasm of sigmoid colon: Secondary | ICD-10-CM | POA: Diagnosis not present

## 2015-03-20 DIAGNOSIS — F17210 Nicotine dependence, cigarettes, uncomplicated: Secondary | ICD-10-CM | POA: Diagnosis not present

## 2015-03-20 DIAGNOSIS — K635 Polyp of colon: Secondary | ICD-10-CM | POA: Insufficient documentation

## 2015-03-20 DIAGNOSIS — M1990 Unspecified osteoarthritis, unspecified site: Secondary | ICD-10-CM | POA: Insufficient documentation

## 2015-03-20 DIAGNOSIS — K219 Gastro-esophageal reflux disease without esophagitis: Secondary | ICD-10-CM | POA: Insufficient documentation

## 2015-03-20 DIAGNOSIS — E7800 Pure hypercholesterolemia, unspecified: Secondary | ICD-10-CM | POA: Insufficient documentation

## 2015-03-20 DIAGNOSIS — Z1211 Encounter for screening for malignant neoplasm of colon: Secondary | ICD-10-CM | POA: Insufficient documentation

## 2015-03-20 HISTORY — PX: COLONOSCOPY WITH PROPOFOL: SHX5780

## 2015-03-20 HISTORY — DX: Asymptomatic varicose veins of unspecified lower extremity: I83.90

## 2015-03-20 HISTORY — DX: Personal history of urinary calculi: Z87.442

## 2015-03-20 HISTORY — DX: Unspecified asthma, uncomplicated: J45.909

## 2015-03-20 HISTORY — DX: Hyperlipidemia, unspecified: E78.5

## 2015-03-20 HISTORY — DX: Unspecified osteoarthritis, unspecified site: M19.90

## 2015-03-20 SURGERY — COLONOSCOPY WITH PROPOFOL
Anesthesia: Monitor Anesthesia Care

## 2015-03-20 SURGERY — ESOPHAGOGASTRODUODENOSCOPY (EGD) WITH PROPOFOL
Anesthesia: Monitor Anesthesia Care

## 2015-03-20 MED ORDER — ONDANSETRON HCL 4 MG/2ML IJ SOLN
INTRAMUSCULAR | Status: DC | PRN
  Administered 2015-03-20: 4 mg via INTRAVENOUS

## 2015-03-20 MED ORDER — PROPOFOL 10 MG/ML IV BOLUS
INTRAVENOUS | Status: AC
  Filled 2015-03-20: qty 20

## 2015-03-20 MED ORDER — PROPOFOL 10 MG/ML IV BOLUS
INTRAVENOUS | Status: AC
  Filled 2015-03-20: qty 40

## 2015-03-20 MED ORDER — LIDOCAINE HCL (CARDIAC) 20 MG/ML IV SOLN
INTRAVENOUS | Status: DC | PRN
  Administered 2015-03-20: 100 mg via INTRAVENOUS

## 2015-03-20 MED ORDER — SODIUM CHLORIDE 0.9 % IV SOLN: INTRAVENOUS | Status: DC

## 2015-03-20 MED ORDER — PROPOFOL 500 MG/50ML IV EMUL
INTRAVENOUS | Status: DC | PRN
  Administered 2015-03-20: 100 ug/kg/min via INTRAVENOUS

## 2015-03-20 MED ORDER — PROPOFOL 10 MG/ML IV BOLUS
INTRAVENOUS | Status: DC | PRN
  Administered 2015-03-20 (×2): 20 mg via INTRAVENOUS

## 2015-03-20 MED ORDER — ONDANSETRON HCL 4 MG/2ML IJ SOLN
INTRAMUSCULAR | Status: AC
  Filled 2015-03-20: qty 2

## 2015-03-20 MED ORDER — LACTATED RINGERS IV SOLN
INTRAVENOUS | Status: DC
  Administered 2015-03-20: 09:00:00 via INTRAVENOUS

## 2015-03-20 MED ORDER — LIDOCAINE HCL (CARDIAC) 20 MG/ML IV SOLN
INTRAVENOUS | Status: AC
  Filled 2015-03-20: qty 5

## 2015-03-20 SURGICAL SUPPLY — 21 items

## 2015-03-20 NOTE — Anesthesia Postprocedure Evaluation (Signed)
Anesthesia Post Note  Patient: Ralph [name]  Procedure(s) Performed: Procedure(s) (LRB): COLONOSCOPY WITH PROPOFOL (N/A)  Patient location during evaluation: PACU Anesthesia Type: MAC Level of consciousness: awake and alert Pain management: pain level controlled Vital Signs Assessment: post-procedure vital signs reviewed and stable Respiratory status: spontaneous breathing, nonlabored ventilation, respiratory function stable and patient connected to nasal cannula oxygen Cardiovascular status: stable and blood pressure returned to baseline Anesthetic complications: no    Last Vitals:  Filed Vitals:   03/20/15 1020 03/20/15 1022  BP: 175/82 144/85  Pulse: 69   Temp:    Resp: 18 19    Last Pain: There were no vitals filed for this visit.               Jamere Stidham J

## 2015-03-20 NOTE — H&P (Signed)
  Procedure: Screening colonoscopy.07/22/2000 normal screening colonoscopy performed  History: The patient is a 64 year old male born [birth year]. He is scheduled to undergo a repeat screening colonoscopy today.  Medication allergies: Morphine caused hives  Past medical history: Chronic cigarette smoking. Severe obstructive airway disease. Hypercholesterolemia. Degenerative disc disease. Herniorrhaphy.  Exam: The patient is alert and lying comfortably on the endoscopy stretcher. Abdomen is soft and nontender to palpation. Cardiac exam reveals a regular rhythm. Lungs are clear to auscultation.  Plan: Proceed with screening colonoscopy

## 2015-03-20 NOTE — Transfer of Care (Signed)
Immediate Anesthesia Transfer of Care Note  Patient: Ralph [name]  Procedure(s) Performed: Procedure(s): COLONOSCOPY WITH PROPOFOL (N/A)  Patient Location: ENDO  Anesthesia Type:MAC  Level of Consciousness:  sedated, patient cooperative and responds to stimulation  Airway & Oxygen Therapy:Patient Spontanous Breathing and Patient connected to face mask oxgen  Post-op Assessment:  Report given to ENDO RN and Post -op Vital signs reviewed and stable  Post vital signs:  Reviewed and stable  Last Vitals:  Filed Vitals:   03/20/15 0822  BP: 159/92  Pulse: 66  Temp: 36.7 C  Resp: 15    Complications: No apparent anesthesia complications

## 2015-03-20 NOTE — Op Note (Signed)
Procedure: Screening colonoscopy. Normal screening colonoscopy performed on 07/22/2000  Endoscopist: Earle Gell  Premedication: Propofol administered by anesthesia  Procedure: The patient was placed in the left lateral decubitus position. Anal inspection and digital rectal exam were normal. The Pentax pediatric colonoscope was introduced into the rectum and advanced to the cecum. A normal-appearing ileocecal valve and appendiceal orifice were identified. Colonic preparation for the exam today was good. Withdrawal time was 14 minutes.  Rectum. Normal. Retroflexed view of the distal rectum was normal  Sigmoid colon and descending colon. Colonic diverticulosis. A 3 mm sessile polyp was removed from the distal sigmoid colon with the cold biopsy forceps and a 5 mm sessile polyp was removed from the proximal sigmoid colon the cold snare  Splenic flexure. Normal  Transverse colon. From the distal transverse colon, a 5 mm sessile polyp was removed with the cold snare  Hepatic flexure. Normal  Ascending colon. Colonic diverticulosis  Cecum and ileocecal valve. Normal  Assessment: Two small polyps were removed from sigmoid colon and a small polyp was removed from the transverse colon. Otherwise normal colonoscopy.  Recommendation: Schedule repeat colonoscopy in 5 years if the colon polyps return adenomatous pathologically.

## 2015-03-20 NOTE — Discharge Instructions (Signed)
Colonoscopy, Care After °Refer to this sheet in the next few weeks. These instructions provide you with information on caring for yourself after your procedure. Your health care provider may also give you more specific instructions. Your treatment has been planned according to current medical practices, but problems sometimes occur. Call your health care provider if you have any problems or questions after your procedure. °WHAT TO EXPECT AFTER THE PROCEDURE  °After your procedure, it is typical to have the following: °· A small amount of blood in your stool. °· Moderate amounts of gas and mild abdominal cramping or bloating. °HOME CARE INSTRUCTIONS °· Do not drive, operate machinery, or sign important documents for 24 hours. °· You may shower and resume your regular physical activities, but move at a slower pace for the first 24 hours. °· Take frequent rest periods for the first 24 hours. °· Walk around or put a warm pack on your abdomen to help reduce abdominal cramping and bloating. °· Drink enough fluids to keep your urine clear or pale yellow. °· You may resume your normal diet as instructed by your health care provider. Avoid heavy or fried foods that are hard to digest. °· Avoid drinking alcohol for 24 hours or as instructed by your health care provider. °· Only take over-the-counter or prescription medicines as directed by your health care provider. °· If a tissue sample (biopsy) was taken during your procedure: °¨ Do not take aspirin or blood thinners for 7 days, or as instructed by your health care provider. °¨ Do not drink alcohol for 7 days, or as instructed by your health care provider. °¨ Eat soft foods for the first 24 hours. °SEEK MEDICAL CARE IF: °You have persistent spotting of blood in your stool 2-3 days after the procedure. °SEEK IMMEDIATE MEDICAL CARE IF: °· You have more than a small spotting of blood in your stool. °· You pass large blood clots in your stool. °· Your abdomen is swollen  (distended). °· You have nausea or vomiting. °· You have a fever. °· You have increasing abdominal pain that is not relieved with medicine. °  °This information is not intended to replace advice given to you by your health care provider. Make sure you discuss any questions you have with your health care provider. °  °Document Released: 11/20/2003 Document Revised: 01/26/2013 Document Reviewed: 12/13/2012 °Elsevier Interactive Patient Education ©2016 [employer] ° °

## 2015-03-20 NOTE — Anesthesia Preprocedure Evaluation (Signed)
Anesthesia Evaluation  Patient identified by MRN, date of birth, ID band Patient awake    Reviewed: Allergy & Precautions, NPO status , Patient's Chart, lab work & pertinent test results  Airway Mallampati: II  TM Distance: >3 FB Neck ROM: Full    Dental no notable dental hx.    Pulmonary asthma , former smoker,    Pulmonary exam normal breath sounds clear to auscultation       Cardiovascular negative cardio ROS Normal cardiovascular exam Rhythm:Regular Rate:Normal     Neuro/Psych negative neurological ROS  negative psych ROS   GI/Hepatic Neg liver ROS, GERD  Medicated,  Endo/Other  negative endocrine ROS  Renal/GU negative Renal ROS  negative genitourinary   Musculoskeletal  (+) Arthritis ,   Abdominal   Peds negative pediatric ROS (+)  Hematology negative hematology ROS (+)   Anesthesia Other Findings   Reproductive/Obstetrics negative OB ROS                             Anesthesia Physical Anesthesia Plan  ASA: II  Anesthesia Plan: MAC   Post-op Pain Management:    Induction: Intravenous  Airway Management Planned: Natural Airway  Additional Equipment:   Intra-op Plan:   Post-operative Plan:   Informed Consent: I have reviewed the patients History and Physical, chart, labs and discussed the procedure including the risks, benefits and alternatives for the proposed anesthesia with the patient or authorized representative who has indicated his/her understanding and acceptance.   Dental advisory given  Plan Discussed with: CRNA  Anesthesia Plan Comments:         Anesthesia Quick Evaluation

## 2015-03-22 ENCOUNTER — Encounter (HOSPITAL_COMMUNITY): Payer: Self-pay | Admitting: Gastroenterology

## 2015-10-31 DIAGNOSIS — R1111 Vomiting without nausea: Secondary | ICD-10-CM | POA: Diagnosis not present

## 2015-12-19 ENCOUNTER — Other Ambulatory Visit: Payer: Self-pay | Admitting: Family Medicine

## 2015-12-19 ENCOUNTER — Encounter: Payer: Self-pay | Admitting: Cardiology

## 2015-12-19 DIAGNOSIS — E7800 Pure hypercholesterolemia, unspecified: Secondary | ICD-10-CM | POA: Diagnosis not present

## 2015-12-19 DIAGNOSIS — N5201 Erectile dysfunction due to arterial insufficiency: Secondary | ICD-10-CM | POA: Diagnosis not present

## 2015-12-19 DIAGNOSIS — J449 Chronic obstructive pulmonary disease, unspecified: Secondary | ICD-10-CM | POA: Diagnosis not present

## 2015-12-19 DIAGNOSIS — Z0000 Encounter for general adult medical examination without abnormal findings: Secondary | ICD-10-CM | POA: Diagnosis not present

## 2015-12-19 DIAGNOSIS — Z23 Encounter for immunization: Secondary | ICD-10-CM | POA: Diagnosis not present

## 2015-12-19 DIAGNOSIS — Z87891 Personal history of nicotine dependence: Secondary | ICD-10-CM

## 2015-12-19 DIAGNOSIS — R0609 Other forms of dyspnea: Secondary | ICD-10-CM | POA: Diagnosis not present

## 2015-12-19 DIAGNOSIS — G4710 Hypersomnia, unspecified: Secondary | ICD-10-CM | POA: Diagnosis not present

## 2015-12-19 DIAGNOSIS — G4761 Periodic limb movement disorder: Secondary | ICD-10-CM | POA: Diagnosis not present

## 2015-12-19 DIAGNOSIS — R454 Irritability and anger: Secondary | ICD-10-CM | POA: Diagnosis not present

## 2015-12-19 DIAGNOSIS — Z1159 Encounter for screening for other viral diseases: Secondary | ICD-10-CM | POA: Diagnosis not present

## 2015-12-19 DIAGNOSIS — J45909 Unspecified asthma, uncomplicated: Secondary | ICD-10-CM | POA: Diagnosis not present

## 2015-12-19 DIAGNOSIS — Z136 Encounter for screening for cardiovascular disorders: Secondary | ICD-10-CM | POA: Diagnosis not present

## 2015-12-20 ENCOUNTER — Ambulatory Visit
Admission: RE | Admit: 2015-12-20 | Discharge: 2015-12-20 | Disposition: A | Discharge status: Home / Self Care | Payer: Medicare Other | Source: Ambulatory Visit | Attending: Family Medicine | Admitting: Family Medicine

## 2015-12-20 DIAGNOSIS — Z136 Encounter for screening for cardiovascular disorders: Secondary | ICD-10-CM | POA: Diagnosis not present

## 2015-12-20 DIAGNOSIS — Z87891 Personal history of nicotine dependence: Secondary | ICD-10-CM | POA: Diagnosis not present

## 2016-01-18 ENCOUNTER — Encounter: Payer: Self-pay | Admitting: Cardiology

## 2016-01-18 ENCOUNTER — Ambulatory Visit (INDEPENDENT_AMBULATORY_CARE_PROVIDER_SITE_OTHER): Payer: Medicare Other | Admitting: Cardiology

## 2016-01-18 VITALS — BP 128/100 | HR 83 | Ht 73.0 in | Wt 217.1 lb

## 2016-01-18 DIAGNOSIS — R030 Elevated blood-pressure reading, without diagnosis of hypertension: Secondary | ICD-10-CM

## 2016-01-18 DIAGNOSIS — I10 Essential (primary) hypertension: Secondary | ICD-10-CM | POA: Diagnosis not present

## 2016-01-18 DIAGNOSIS — G4719 Other hypersomnia: Secondary | ICD-10-CM | POA: Diagnosis not present

## 2016-01-18 DIAGNOSIS — R0609 Other forms of dyspnea: Secondary | ICD-10-CM | POA: Insufficient documentation

## 2016-01-18 DIAGNOSIS — R0600 Dyspnea, unspecified: Secondary | ICD-10-CM

## 2016-01-18 DIAGNOSIS — IMO0001 Reserved for inherently not codable concepts without codable children: Secondary | ICD-10-CM

## 2016-01-18 NOTE — Patient Instructions (Addendum)
Medication Instructions:  Your physician recommends that you continue on your current medications as directed. Please refer to the Current Medication list given to you today.   Labwork: None   Testing/Procedures: Your physician has requested that you have en exercise stress myoview. For further information please visit [email or web address]. Please follow instruction sheet, as given.  Your physician has requested that you have an echocardiogram. Echocardiography is a painless test that uses sound waves to create images of your heart. It provides your doctor with information about the size and shape of your heart and how well your heart's chambers and valves are working. This procedure takes approximately one hour. There are no restrictions for this procedure.  Your physician has recommended that you have a sleep study. This test records several body functions during sleep, including: brain activity, eye movement, oxygen and carbon dioxide blood levels, heart rate and rhythm, breathing rate and rhythm, the flow of air through your mouth and nose, snoring, body muscle movements, and chest and belly movement.  Schedule an appointment for  24 HOUR BP monitor.    Follow-Up: Your physician recommends that you schedule a follow-up appointment as needed.  CANCEL THE APPOINTMENT THAT YOU ALREADY HAVE SCHEDULED FOR THE SLEEP STUDY WITH [facility]        If you need a refill on your cardiac medications before your next appointment, please call your pharmacy.

## 2016-01-18 NOTE — Progress Notes (Signed)
Cardiology Office Note    Date:  01/18/2016   ID:  Ralph [name], DOB December 31, 1950, MRN [id number]  PCP:  Tawanna Solo, MD  Cardiologist:  Fransico Him, MD   Chief Complaint  Patient presents with  . Shortness of Breath    History of Present Illness:  Ralph [name] is a 65 y.o. male with a history of hyperlipidemia, GERD and asthma who presents today for evaluation of DOE.  He has a history of COPD/asthma and recently had worsening DOE that has not improved with inhalers.  He is now referred for cardiac evaluation.  His SOB is worse with upper body movement but he can walk 2x daily 1/2 mile without any problems.  He says that he can walk all day without any problems.  When he gets SOB he says that he wheezes and has called EMS several times.  He says that the times EMS was called were during the night when he would wake up acutely SOB with Wheezing.  He was given some nebs which eventually improved his symptoms and he never had to go to the ER.  He now has a nebulizer at night which he has used twice which resolves his symptoms over 20 minutes.  He denies any chest pain or pressure.  He denies any LE edema or abdominal swelling but has put on 20lbs since retirement.  He denies any palpitations or syncope.  His wife has noticed him snoring more and he is fatigued all day.  He does not feel rested in the am and has to nap mid morning.  He is a former smoker.     Past Medical History:  Diagnosis Date  . Arthritis    hands  . Asthma   . GERD (gastroesophageal reflux disease)   . History of kidney stones    x2  -episodes  . Hyperlipidemia   . Varicose veins    bilateral surgeries- no problems now    Past Surgical History:  Procedure Laterality Date  . COLONOSCOPY WITH PROPOFOL N/A 03/20/2015   Procedure: COLONOSCOPY WITH PROPOFOL;  Surgeon: Garlan Fair, MD;  Location: [facility];  Service: Endoscopy;  Laterality: N/A;  . HERNIA REPAIR     inguinal hernia  .  TONSILLECTOMY    . VARICOSE VEIN SURGERY Bilateral    2 years ago  . VASECTOMY      Current Medications: Outpatient Medications Prior to Visit  Medication Sig Dispense Refill  . atorvastatin (LIPITOR) 10 MG tablet Take 10 mg by mouth daily.    . budesonide-formoterol (SYMBICORT) 160-4.5 MCG/ACT inhaler Inhale 1 puff into the lungs 2 (two) times daily.    . clonazePAM (KLONOPIN) 0.5 MG tablet Take 0.5 mg by mouth at bedtime.    Marland Kitchen escitalopram (LEXAPRO) 20 MG tablet Take 20 mg by mouth daily.    Marland Kitchen ibuprofen (ADVIL,MOTRIN) 200 MG tablet Take 400 mg by mouth every 6 (six) hours as needed for pain or headache.    Marland Kitchen omeprazole (PRILOSEC) 20 MG capsule Take 20 mg by mouth daily.    . tamsulosin (FLOMAX) 0.4 MG CAPS capsule Take 0.4 mg by mouth at bedtime.    Marland Kitchen tiotropium (SPIRIVA) 18 MCG inhalation capsule Place 18 mcg into inhaler and inhale daily.    . VENTOLIN HFA 108 (90 BASE) MCG/ACT inhaler Inhale 1-2 puffs into the lungs every 6 (six) hours as needed. Shortness of breath or wheezing.     No facility-administered medications prior to visit.  Allergies:   Latex and Morphine and related   Social History   Social History  . Marital status: Single    Spouse name: N/A  . Number of children: N/A  . Years of education: N/A   Social History Main Topics  . Smoking status: Former Smoker    Years: 30.00    Types: Cigarettes    Quit date: 03/18/2013  . Smokeless tobacco: None  . Alcohol use Yes     Comment: social  . Drug use: No  . Sexual activity: Not Asked   Other Topics Concern  . None   Social History Narrative  . None     Family History:  The patient's family history includes COPD in his father; Dementia in his mother.   ROS:   Please see the history of present illness.    ROS All other systems reviewed and are negative.  No flowsheet data found.     PHYSICAL EXAM:   VS:  BP (!) 128/100 (BP Location: Right Arm, Patient Position: Sitting, Cuff Size: Normal)    Pulse 83   Ht 6\' 1"  (1.854 m)   Wt 217 lb 1.9 oz (98.5 kg)   BMI 28.65 kg/m    GEN: Well nourished, well developed, in no acute distress  HEENT: normal  Neck: no JVD, carotid bruits, or masses Cardiac: RRR; no murmurs, rubs, or gallops,no edema.  Intact distal pulses bilaterally.  Respiratory:  clear to auscultation bilaterally, normal work of breathing GI: soft, nontender, nondistended, + BS MS: no deformity or atrophy  Skin: warm and dry, no rash Neuro:  Alert and Oriented x 3, Strength and sensation are intact Psych: euthymic mood, full affect  Wt Readings from Last 3 Encounters:  01/18/16 217 lb 1.9 oz (98.5 kg)  03/20/15 215 lb (97.5 kg)  03/20/11 195 lb (88.5 kg)      Studies/Labs Reviewed:   EKG:  EKG ist ordered today and showed NSR with no ST changes and normal intervals.   Recent Labs: No results found for requested labs within last 8760 hours.   Lipid Panel No results found for: CHOL, TRIG, HDL, CHOLHDL, VLDL, LDLCALC, LDLDIRECT  Additional studies/ records that were reviewed today include:  OV notes    ASSESSMENT:    1. DOE (dyspnea on exertion)   2. Excessive daytime sleepiness   3. Elevated BP      PLAN:  In order of problems listed above:  1. DOE - ? Etiology.  Diff Dx includes coronary ischemia, OSA, GERD, poorly controlled HTN.  I will check a 2D echo to rule out structural heart disease and assess for diastolic dysfunction.  I will also get a stress myoview to rule out ischemia.  He will wear a 24 hour BP monitor to assess for HTN.  2. Excessive daytime sleepiness - With his history of awakening with severe SOB, snoring and fatigue, his symptoms may be due to OSA.  I will get a split night sleep study.   3. Elevated BP - I will get a 24 hour BP study to evaluate for nocturnal hypertension.      Medication Adjustments/Labs and Tests Ordered: Current medicines are reviewed at length with the patient today.  Concerns regarding medicines are  outlined above.  Medication changes, Labs and Tests ordered today are listed in the Patient Instructions below.  There are no Patient Instructions on file for this visit.   Signed, Fransico Him, MD  01/18/2016 11:58 AM    [facility] [address] [place], [place]  [address] Phone: [phone]; Fax: [phone]

## 2016-01-22 ENCOUNTER — Telehealth: Payer: Self-pay | Admitting: Cardiology

## 2016-01-22 NOTE — Telephone Encounter (Signed)
error 

## 2016-01-24 ENCOUNTER — Encounter: Payer: Self-pay | Admitting: Cardiology

## 2016-01-29 ENCOUNTER — Telehealth (HOSPITAL_COMMUNITY): Payer: Self-pay | Admitting: *Deleted

## 2016-01-29 NOTE — Telephone Encounter (Signed)
Left message on voicemail per DPR in reference to upcoming appointment scheduled on 02/01/16 with detailed instructions given per Myocardial Perfusion Study Information Sheet for the test. LM to arrive 15 minutes early, and that it is imperative to arrive on time for appointment to keep from having the test rescheduled. If you need to cancel or reschedule your appointment, please call the office within 24 hours of your appointment. Failure to do so may result in a cancellation of your appointment, and a $50 no show fee. Phone number given for call back for any questions. Kirstie Peri

## 2016-01-31 ENCOUNTER — Encounter: Payer: Self-pay | Admitting: *Deleted

## 2016-01-31 ENCOUNTER — Other Ambulatory Visit: Payer: Self-pay | Admitting: Cardiology

## 2016-01-31 ENCOUNTER — Ambulatory Visit (INDEPENDENT_AMBULATORY_CARE_PROVIDER_SITE_OTHER): Payer: Medicare Other

## 2016-01-31 DIAGNOSIS — G4719 Other hypersomnia: Secondary | ICD-10-CM

## 2016-01-31 DIAGNOSIS — R0600 Dyspnea, unspecified: Secondary | ICD-10-CM

## 2016-01-31 DIAGNOSIS — I10 Essential (primary) hypertension: Secondary | ICD-10-CM

## 2016-01-31 DIAGNOSIS — R0609 Other forms of dyspnea: Principal | ICD-10-CM

## 2016-01-31 NOTE — Progress Notes (Signed)
Patient ID: Ralph [name], male   DOB: Sep 17, 1950, 65 y.o.   MRN: [id number] 24 hour ambulatory blood pressure monitor applied to patient.

## 2016-02-01 ENCOUNTER — Ambulatory Visit (HOSPITAL_COMMUNITY): Payer: Medicare Other | Attending: Cardiology

## 2016-02-01 ENCOUNTER — Ambulatory Visit (HOSPITAL_BASED_OUTPATIENT_CLINIC_OR_DEPARTMENT_OTHER): Payer: Medicare Other

## 2016-02-01 ENCOUNTER — Other Ambulatory Visit: Payer: Self-pay

## 2016-02-01 DIAGNOSIS — I252 Old myocardial infarction: Secondary | ICD-10-CM | POA: Diagnosis not present

## 2016-02-01 DIAGNOSIS — R030 Elevated blood-pressure reading, without diagnosis of hypertension: Secondary | ICD-10-CM

## 2016-02-01 DIAGNOSIS — I5030 Unspecified diastolic (congestive) heart failure: Secondary | ICD-10-CM | POA: Diagnosis not present

## 2016-02-01 DIAGNOSIS — G4719 Other hypersomnia: Secondary | ICD-10-CM

## 2016-02-01 DIAGNOSIS — R0609 Other forms of dyspnea: Secondary | ICD-10-CM

## 2016-02-01 DIAGNOSIS — R0600 Dyspnea, unspecified: Secondary | ICD-10-CM

## 2016-02-01 DIAGNOSIS — IMO0001 Reserved for inherently not codable concepts without codable children: Secondary | ICD-10-CM

## 2016-02-01 MED ORDER — REGADENOSON 0.4 MG/5ML IV SOLN
0.4000 mg | Freq: Once | INTRAVENOUS | Status: AC
  Administered 2016-02-01: 0.4 mg via INTRAVENOUS

## 2016-02-01 MED ORDER — TECHNETIUM TC 99M TETROFOSMIN IV KIT
10.6000 | PACK | Freq: Once | INTRAVENOUS | Status: AC | PRN
  Administered 2016-02-01: 10.6 via INTRAVENOUS
  Filled 2016-02-01: qty 11

## 2016-02-01 MED ORDER — TECHNETIUM TC 99M TETROFOSMIN IV KIT
32.8000 | PACK | Freq: Once | INTRAVENOUS | Status: AC | PRN
  Administered 2016-02-01: 32.8 via INTRAVENOUS
  Filled 2016-02-01: qty 33

## 2016-02-04 LAB — MYOCARDIAL PERFUSION IMAGING
CHL CUP NUCLEAR SDS: 5
CHL CUP RESTING HR STRESS: 63 {beats}/min
LHR: 0.31
LVDIAVOL: 97 mL (ref 62–150)
LVSYSVOL: 36 mL
NUC STRESS TID: 0.98
Peak HR: 123 {beats}/min
SRS: 4
SSS: 7

## 2016-02-05 ENCOUNTER — Telehealth: Payer: Self-pay

## 2016-02-05 DIAGNOSIS — R9439 Abnormal result of other cardiovascular function study: Secondary | ICD-10-CM

## 2016-02-05 DIAGNOSIS — R0609 Other forms of dyspnea: Secondary | ICD-10-CM

## 2016-02-05 DIAGNOSIS — R0602 Shortness of breath: Secondary | ICD-10-CM

## 2016-02-05 DIAGNOSIS — R0600 Dyspnea, unspecified: Secondary | ICD-10-CM

## 2016-02-05 NOTE — Telephone Encounter (Signed)
Informed patient of results and verbal understanding expressed.  Coronary CT and BMET ordered for scheduling. Patient agrees with treatment plan.

## 2016-02-05 NOTE — Telephone Encounter (Signed)
-----   Message from Sueanne Margarita, MD sent at 02/01/2016  4:54 PM EDT ----- Echo showed normal LVF with increased stiffness of heart muscle.  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  Please get Chest CT angio to assess aorta further.  Will need BMET prior to CT.

## 2016-02-05 NOTE — Telephone Encounter (Signed)
-----   Message from Sueanne Margarita, MD sent at 02/05/2016 12:03 PM EDT ----- Partially reversible inferior and inferolateral defects which could be infarct with peri infarct ischemia but no wall motion abnormality in this area.  This could also be variations in diaphragmatic attenustion.  Please change CT angio that I ordered to Coronary CTA with morphology and calcium score.

## 2016-02-07 DIAGNOSIS — D225 Melanocytic nevi of trunk: Secondary | ICD-10-CM | POA: Diagnosis not present

## 2016-02-07 DIAGNOSIS — Z23 Encounter for immunization: Secondary | ICD-10-CM | POA: Diagnosis not present

## 2016-02-07 DIAGNOSIS — L821 Other seborrheic keratosis: Secondary | ICD-10-CM | POA: Diagnosis not present

## 2016-02-07 DIAGNOSIS — L819 Disorder of pigmentation, unspecified: Secondary | ICD-10-CM | POA: Diagnosis not present

## 2016-02-08 ENCOUNTER — Encounter: Payer: Self-pay | Admitting: Cardiology

## 2016-02-11 ENCOUNTER — Other Ambulatory Visit: Payer: Medicare Other

## 2016-02-12 ENCOUNTER — Other Ambulatory Visit: Payer: Medicare Other | Admitting: *Deleted

## 2016-02-12 DIAGNOSIS — R9439 Abnormal result of other cardiovascular function study: Secondary | ICD-10-CM

## 2016-02-12 DIAGNOSIS — R0609 Other forms of dyspnea: Secondary | ICD-10-CM

## 2016-02-12 DIAGNOSIS — R0602 Shortness of breath: Secondary | ICD-10-CM | POA: Diagnosis not present

## 2016-02-12 DIAGNOSIS — R0600 Dyspnea, unspecified: Secondary | ICD-10-CM

## 2016-02-13 LAB — BASIC METABOLIC PANEL
BUN: 12 mg/dL (ref 7–25)
CHLORIDE: 103 mmol/L (ref 98–110)
CO2: 25 mmol/L (ref 20–31)
Calcium: 9.4 mg/dL (ref 8.6–10.3)
Creat: 1.3 mg/dL — ABNORMAL HIGH (ref 0.70–1.25)
Glucose, Bld: 98 mg/dL (ref 65–99)
Potassium: 4.5 mmol/L (ref 3.5–5.3)
SODIUM: 141 mmol/L (ref 135–146)

## 2016-02-15 ENCOUNTER — Ambulatory Visit: Payer: Medicare Other | Admitting: Cardiology

## 2016-02-18 ENCOUNTER — Encounter (HOSPITAL_COMMUNITY): Payer: Self-pay

## 2016-02-18 ENCOUNTER — Ambulatory Visit (HOSPITAL_COMMUNITY)
Admission: RE | Admit: 2016-02-18 | Discharge: 2016-02-18 | Disposition: A | Discharge status: Home / Self Care | Payer: Medicare Other | Source: Ambulatory Visit | Attending: Cardiology | Admitting: Cardiology

## 2016-02-18 DIAGNOSIS — R0600 Dyspnea, unspecified: Secondary | ICD-10-CM

## 2016-02-18 DIAGNOSIS — I2510 Atherosclerotic heart disease of native coronary artery without angina pectoris: Secondary | ICD-10-CM | POA: Diagnosis not present

## 2016-02-18 DIAGNOSIS — R0609 Other forms of dyspnea: Secondary | ICD-10-CM | POA: Insufficient documentation

## 2016-02-18 DIAGNOSIS — R0602 Shortness of breath: Secondary | ICD-10-CM

## 2016-02-18 DIAGNOSIS — R9439 Abnormal result of other cardiovascular function study: Secondary | ICD-10-CM | POA: Insufficient documentation

## 2016-02-18 MED ORDER — NITROGLYCERIN 0.4 MG SL SUBL
SUBLINGUAL_TABLET | SUBLINGUAL | Status: AC
  Filled 2016-02-18: qty 2

## 2016-02-18 MED ORDER — METOPROLOL TARTRATE 5 MG/5ML IV SOLN
5.0000 mg | INTRAVENOUS | Status: DC | PRN
  Administered 2016-02-18 (×2): 5 mg via INTRAVENOUS
  Filled 2016-02-18: qty 5

## 2016-02-18 MED ORDER — METOPROLOL TARTRATE 5 MG/5ML IV SOLN
INTRAVENOUS | Status: AC
  Filled 2016-02-18: qty 5

## 2016-02-18 MED ORDER — IOPAMIDOL (ISOVUE-370) INJECTION 76%
INTRAVENOUS | Status: AC
  Administered 2016-02-18: 80 mL
  Filled 2016-02-18: qty 100

## 2016-02-18 MED ORDER — METOPROLOL TARTRATE 5 MG/5ML IV SOLN
INTRAVENOUS | Status: AC
  Filled 2016-02-18: qty 10

## 2016-02-18 MED ORDER — NITROGLYCERIN 0.4 MG SL SUBL
0.8000 mg | SUBLINGUAL_TABLET | Freq: Once | SUBLINGUAL | Status: AC
  Administered 2016-02-18: 0.8 mg via SUBLINGUAL
  Filled 2016-02-18: qty 25

## 2016-02-20 ENCOUNTER — Encounter: Payer: Self-pay | Admitting: Cardiology

## 2016-02-20 ENCOUNTER — Telehealth: Payer: Self-pay

## 2016-02-20 DIAGNOSIS — I2510 Atherosclerotic heart disease of native coronary artery without angina pectoris: Secondary | ICD-10-CM

## 2016-02-20 DIAGNOSIS — R0602 Shortness of breath: Secondary | ICD-10-CM

## 2016-02-20 MED ORDER — ASPIRIN EC 81 MG PO TBEC: 81.0000 mg | DELAYED_RELEASE_TABLET | Freq: Every day | ORAL | 3 refills | Status: DC

## 2016-02-20 MED ORDER — ISOSORBIDE MONONITRATE ER 30 MG PO TB24: 15.0000 mg | ORAL_TABLET | Freq: Every day | ORAL | 11 refills | Status: DC

## 2016-02-20 NOTE — Telephone Encounter (Signed)
-----   Message from Sueanne Margarita, MD sent at 02/20/2016  8:31 AM EDT ----- Coronary CTA showed minimal plaque in the mid LAD and mid D1 0-25% and LCx 0-25% and 50-75% moderate noncalcified plaque in the ostial OM2.  Needs aggressive risk factor modification.  Please have patient come in for FLP and ALT and also start ASA 81mg  daily

## 2016-02-20 NOTE — Telephone Encounter (Signed)
Notes Recorded by Theodoro Parma, RN on 02/20/2016 at 5:48 PM EDT Informed patient of results and verbal understanding expressed.  Encouraged aggressive risk factor modification - more exercise and healthier eating. Instructed patient to START ASA 81 mg daily. He reports he used to take ASA 81 mg daily but stopped due to excessive bleeding when he'd get a scratch. Reiterated to patient he will have to be more careful when taking. Instructed patient to START IMDUR 15 mg daily. Patient had fasting blood work drawn 12/19/15 - lab results available for viewing under the labs tab. Patient understands BNP needs to be drawn as well. He requests a call tomorrow at 1100 after speaking with Dr. Radford Pax to see if fasting labs will need to be redrawn prior to lab appointment. Confirmed with patient he is currently taking Lipitor 10 mg qod. Taking daily made him have horrible leg cramping. 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 He would like to know from Dr. Radford Pax if this is safe.   To Dr. Radford Pax for recommendations. ------  Notes Recorded by Sueanne Margarita, MD on 02/20/2016 at 8:32 AM EDT Add Imdur 15mg  daily to see if this helps with SOB and have patient come in for a BNP ------  Notes Recorded by Sueanne Margarita, MD on 02/20/2016 at 8:31 AM EDT Coronary CTA showed minimal plaque in the mid LAD and mid D1 0-25% and LCx 0-25% and 50-75% moderate noncalcified plaque in the ostial OM2. Needs aggressive risk factor modification. Please have patient come in for FLP and ALT and also start ASA 81mg  daily  Details

## 2016-02-21 ENCOUNTER — Telehealth: Payer: Self-pay

## 2016-02-21 ENCOUNTER — Other Ambulatory Visit: Payer: Medicare Other | Admitting: *Deleted

## 2016-02-21 DIAGNOSIS — I2510 Atherosclerotic heart disease of native coronary artery without angina pectoris: Secondary | ICD-10-CM

## 2016-02-21 DIAGNOSIS — E7800 Pure hypercholesterolemia, unspecified: Secondary | ICD-10-CM

## 2016-02-21 DIAGNOSIS — R0602 Shortness of breath: Secondary | ICD-10-CM | POA: Diagnosis not present

## 2016-02-21 LAB — BRAIN NATRIURETIC PEPTIDE: BRAIN NATRIURETIC PEPTIDE: 16.1 pg/mL (ref <100)

## 2016-02-21 MED ORDER — ROSUVASTATIN CALCIUM 10 MG PO TABS: 10.0000 mg | ORAL_TABLET | Freq: Every day | ORAL | 3 refills | Status: DC

## 2016-02-21 NOTE — Telephone Encounter (Signed)
-----   Message from Leeroy Bock, [facility] sent at 02/21/2016 11:59 AM EDT ----- LDL is far above goal 70mg /dL at 192mg /dL given hx of CAD. Do not see that patient has tried any other statins aside from Lipitor in Epic system. Would recommend a trial of Crestor 10mg  once daily or pravastatin 20mg  daily - they tend to be better tolerated than other statins. Would repeat lipid panel in 3 months.

## 2016-02-21 NOTE — Telephone Encounter (Signed)
Patient agrees to try Crestor.  Instructed patient to STOP LIPITOR and START CRESTOR 10 mg daily. Per patient request, recall placed to have letter sent to schedule labs. Patient agrees with treatment plan.

## 2016-03-03 ENCOUNTER — Encounter: Payer: Self-pay | Admitting: Cardiology

## 2016-03-11 ENCOUNTER — Ambulatory Visit (HOSPITAL_BASED_OUTPATIENT_CLINIC_OR_DEPARTMENT_OTHER): Payer: Medicare Other | Attending: Cardiology | Admitting: Cardiology

## 2016-03-11 VITALS — Ht 72.0 in | Wt 215.0 lb

## 2016-03-11 DIAGNOSIS — R400 Somnolence: Secondary | ICD-10-CM | POA: Diagnosis not present

## 2016-03-11 DIAGNOSIS — G4719 Other hypersomnia: Secondary | ICD-10-CM

## 2016-03-11 DIAGNOSIS — R0600 Dyspnea, unspecified: Secondary | ICD-10-CM | POA: Insufficient documentation

## 2016-03-11 DIAGNOSIS — R030 Elevated blood-pressure reading, without diagnosis of hypertension: Secondary | ICD-10-CM | POA: Diagnosis not present

## 2016-03-11 DIAGNOSIS — R0609 Other forms of dyspnea: Secondary | ICD-10-CM

## 2016-03-11 DIAGNOSIS — IMO0001 Reserved for inherently not codable concepts without codable children: Secondary | ICD-10-CM

## 2016-03-11 DIAGNOSIS — G4733 Obstructive sleep apnea (adult) (pediatric): Secondary | ICD-10-CM | POA: Diagnosis not present

## 2016-03-20 ENCOUNTER — Encounter: Payer: Self-pay | Admitting: Cardiology

## 2016-03-20 ENCOUNTER — Other Ambulatory Visit: Payer: Self-pay

## 2016-03-20 MED ORDER — ROSUVASTATIN CALCIUM 10 MG PO TABS: 10.0000 mg | ORAL_TABLET | Freq: Every day | ORAL | 3 refills | Status: DC

## 2016-03-20 MED ORDER — ISOSORBIDE MONONITRATE ER 30 MG PO TB24: 15.0000 mg | ORAL_TABLET | Freq: Every day | ORAL | 3 refills | Status: DC

## 2016-03-24 DIAGNOSIS — K210 Gastro-esophageal reflux disease with esophagitis: Secondary | ICD-10-CM | POA: Diagnosis not present

## 2016-03-24 DIAGNOSIS — G4761 Periodic limb movement disorder: Secondary | ICD-10-CM | POA: Diagnosis not present

## 2016-03-24 DIAGNOSIS — R454 Irritability and anger: Secondary | ICD-10-CM | POA: Diagnosis not present

## 2016-03-24 DIAGNOSIS — Z6830 Body mass index (BMI) 30.0-30.9, adult: Secondary | ICD-10-CM | POA: Diagnosis not present

## 2016-03-24 DIAGNOSIS — J449 Chronic obstructive pulmonary disease, unspecified: Secondary | ICD-10-CM | POA: Diagnosis not present

## 2016-03-24 DIAGNOSIS — E669 Obesity, unspecified: Secondary | ICD-10-CM | POA: Diagnosis not present

## 2016-03-28 ENCOUNTER — Encounter: Payer: Self-pay | Admitting: Cardiology

## 2016-04-06 NOTE — Procedures (Signed)
   Patient Name: Ralph [name], Ralph [name] Date: 03/11/2016 Gender: Male D.O.B: 17-Jul-1950 Age (years): 62 Referring Provider: Fransico Him MD, ABSM Height (inches): 72 Interpreting Physician: Fransico Him MD, ABSM Weight (lbs): 215 RPSGT: Laren Everts BMI: 29 MRN: [id number] Neck Size: 16.0  CLINICAL INFORMATION Sleep Study Type: NPSG Indication for sleep study: Excessive Daytime Sleepiness, Fatigue, Hypertension, Snoring, Witnessed Apneas Epworth Sleepiness Score: 11  SLEEP STUDY TECHNIQUE As per the AASM Manual for the Scoring of Sleep and Associated Events v2.3 (April 2016) with a hypopnea requiring 4% desaturations.  The channels recorded and monitored were frontal, central and occipital EEG, electrooculogram (EOG), submentalis EMG (chin), nasal and oral airflow, thoracic and abdominal wall motion, anterior tibialis EMG, snore microphone, electrocardiogram, and pulse oximetry.  MEDICATIONS Medications self-administered by patient taken the night of the study : CRESTOR, CLONAZEPAM, IBUPROFEN, MONTELUKAST, OMEPRAZOLE, VENTOLIN  SLEEP ARCHITECTURE The study was initiated at 9:56:30 PM and ended at 4:58:36 AM. Sleep onset time was 42.3 minutes and the sleep efficiency was 68.7%. The total sleep time was 289.8 minutes. Stage REM latency was 131.0 minutes. The patient spent 16.49% of the night in stage N1 sleep, 48.14% in stage N2 sleep, 0.00% in stage N3 and 35.37% in REM. Alpha intrusion was absent. Supine sleep was 12.60%.  RESPIRATORY PARAMETERS The overall apnea/hypopnea index (AHI) was 15.5 per hour. There were 9 total apneas, including 9 obstructive, 0 central and 0 mixed apneas. There were 66 hypopneas and 77 RERAs. The AHI during Stage REM sleep was 1.2 per hour. AHI while supine was 87.1 per hour. The mean oxygen saturation was 93.20%. The minimum SpO2 during sleep was 83.00%. Moderate snoring was noted during this study.  CARDIAC DATA The 2 lead EKG  demonstrated sinus rhythm. The mean heart rate was 73.17 beats per minute. Other EKG findings include: PVCs.  LEG MOVEMENT DATA The total PLMS were 180 with a resulting PLMS index of 37.27. Associated arousal with leg movement index was 5.2 .  IMPRESSIONS - Mild to moderate obstructive sleep apnea occurred during this study (AHI = 15.5/h). - No significant central sleep apnea occurred during this study (CAI = 0.0/h). - Oxygen desaturation was noted during this study (Min O2 = 83.00%). - The patient snored with Moderate snoring volume. - EKG findings include PVCs. - Moderate periodic limb movements of sleep occurred during the study. Associated arousals were significant.  DIAGNOSIS - Obstructive Sleep Apnea (327.23 [G47.33 ICD-10])  RECOMMENDATIONS - Therapeutic CPAP titration to determine optimal pressure required to alleviate sleep disordered breathing. - Positional therapy avoiding supine position during sleep. - Avoid alcohol, sedatives and other CNS depressants that may worsen sleep apnea and disrupt normal sleep architecture. - Sleep hygiene should be reviewed to assess factors that may improve sleep quality. - Weight management and regular exercise should be initiated or continued if appropriate.  [place], American Board of Sleep Medicine  ELECTRONICALLY SIGNED ON:  04/06/2016, 3:57 PM [place] PH: [phone]   FX: [phone] [place]

## 2016-04-07 ENCOUNTER — Encounter: Payer: Self-pay | Admitting: Cardiology

## 2016-04-08 ENCOUNTER — Encounter: Payer: Self-pay | Admitting: *Deleted

## 2016-04-08 NOTE — Progress Notes (Signed)
Spoke to the patient gave him his results and he wants to know why he needs another cpap titration. I have sent notification to Dr Radford Pax

## 2016-04-09 ENCOUNTER — Encounter: Payer: Self-pay | Admitting: *Deleted

## 2016-04-09 ENCOUNTER — Telehealth: Payer: Self-pay | Admitting: *Deleted

## 2016-04-09 ENCOUNTER — Other Ambulatory Visit: Payer: Self-pay | Admitting: *Deleted

## 2016-04-09 DIAGNOSIS — G4733 Obstructive sleep apnea (adult) (pediatric): Secondary | ICD-10-CM

## 2016-04-09 NOTE — Telephone Encounter (Signed)
Spoke to the patient gave him his results he verbalized understanding but says he is going on vacation for 2 months on Dec 28. He will return around the second week of march. I will try to schedule it that far out with [facility].

## 2016-04-09 NOTE — Telephone Encounter (Signed)
-----   Message from Ralph Margarita, MD sent at 04/06/2016  4:00 PM EST ----- Please let patient know that they have sleep apnea and recommend CPAP titration. Please set up titration in the sleep lab.

## 2016-07-09 ENCOUNTER — Encounter (HOSPITAL_BASED_OUTPATIENT_CLINIC_OR_DEPARTMENT_OTHER): Payer: Medicare Other

## 2016-08-15 DIAGNOSIS — R39198 Other difficulties with micturition: Secondary | ICD-10-CM | POA: Diagnosis not present

## 2016-08-15 DIAGNOSIS — K210 Gastro-esophageal reflux disease with esophagitis: Secondary | ICD-10-CM | POA: Diagnosis not present

## 2016-08-15 DIAGNOSIS — J449 Chronic obstructive pulmonary disease, unspecified: Secondary | ICD-10-CM | POA: Diagnosis not present

## 2016-08-15 DIAGNOSIS — G4761 Periodic limb movement disorder: Secondary | ICD-10-CM | POA: Diagnosis not present

## 2016-08-15 DIAGNOSIS — I10 Essential (primary) hypertension: Secondary | ICD-10-CM | POA: Diagnosis not present

## 2016-08-15 DIAGNOSIS — E7800 Pure hypercholesterolemia, unspecified: Secondary | ICD-10-CM | POA: Diagnosis not present

## 2016-08-15 DIAGNOSIS — R1312 Dysphagia, oropharyngeal phase: Secondary | ICD-10-CM | POA: Diagnosis not present

## 2016-08-15 DIAGNOSIS — G4733 Obstructive sleep apnea (adult) (pediatric): Secondary | ICD-10-CM | POA: Diagnosis not present

## 2016-08-15 DIAGNOSIS — R454 Irritability and anger: Secondary | ICD-10-CM | POA: Diagnosis not present

## 2016-08-15 DIAGNOSIS — J45909 Unspecified asthma, uncomplicated: Secondary | ICD-10-CM | POA: Diagnosis not present

## 2016-08-18 ENCOUNTER — Telehealth: Payer: Self-pay

## 2016-08-18 ENCOUNTER — Encounter: Payer: Self-pay | Admitting: *Deleted

## 2016-08-18 NOTE — Telephone Encounter (Signed)
SENT NOTES TO SCHEDULING 

## 2016-08-20 ENCOUNTER — Telehealth: Payer: Self-pay | Admitting: *Deleted

## 2016-08-20 NOTE — Telephone Encounter (Signed)
Informed patient of upcoming in lab cpap titration study and patient understanding was verbalized. Patient understand his test is scheduled for Monday October 13 2016. Patient understands he will receive a letter by mail with a time, date, and directions. Patient understands to call if his letter does not reach him in a timely manner. Patient agrees with the treatment plan and thanked me. Appointment letter has been sent.

## 2016-09-04 ENCOUNTER — Encounter: Payer: Self-pay | Admitting: Internal Medicine

## 2016-09-04 ENCOUNTER — Encounter: Payer: Self-pay | Admitting: Cardiology

## 2016-09-12 ENCOUNTER — Encounter: Payer: Self-pay | Admitting: Internal Medicine

## 2016-09-12 ENCOUNTER — Ambulatory Visit (INDEPENDENT_AMBULATORY_CARE_PROVIDER_SITE_OTHER): Payer: Medicare Other | Admitting: Internal Medicine

## 2016-09-12 VITALS — BP 154/90 | HR 83 | Ht 72.0 in | Wt 210.0 lb

## 2016-09-12 DIAGNOSIS — R9389 Abnormal findings on diagnostic imaging of other specified body structures: Secondary | ICD-10-CM

## 2016-09-12 DIAGNOSIS — J449 Chronic obstructive pulmonary disease, unspecified: Secondary | ICD-10-CM

## 2016-09-12 DIAGNOSIS — R938 Abnormal findings on diagnostic imaging of other specified body structures: Secondary | ICD-10-CM | POA: Diagnosis not present

## 2016-09-12 DIAGNOSIS — J441 Chronic obstructive pulmonary disease with (acute) exacerbation: Secondary | ICD-10-CM | POA: Insufficient documentation

## 2016-09-12 DIAGNOSIS — I10 Essential (primary) hypertension: Secondary | ICD-10-CM | POA: Diagnosis not present

## 2016-09-12 MED ORDER — GLYCOPYRROLATE-FORMOTEROL 9-4.8 MCG/ACT IN AERO: 2.0000 | INHALATION_SPRAY | Freq: Two times a day (BID) | RESPIRATORY_TRACT | 0 refills | Status: DC

## 2016-09-12 MED ORDER — GLYCOPYRROLATE-FORMOTEROL 9-4.8 MCG/ACT IN AERO: 2.0000 | INHALATION_SPRAY | Freq: Two times a day (BID) | RESPIRATORY_TRACT | 11 refills | Status: DC

## 2016-09-12 NOTE — Patient Instructions (Addendum)
Stop singulair, symbicort and spiriva  Plan A = Automatic =  Bevespi  Take 2 puffs first thing in am and then another 2 puffs about 12 hours later and continue prilosec 20 mg Take 30- 60 min before your first and last meals of the day   Plan B = Backup Only use your albuterol as a rescue medication to be used if you can't catch your breath by resting or doing a relaxed purse lip breathing pattern.  - The less you use it, the better it will work when you need it. - Ok to use the inhaler up to 2 puffs  every 4 hours if you must but call for appointment if use goes up over your usual need - Don't leave home without it !!  (think of it like the spare tire for your car)     To get the most out of exercise, you need to be continuously aware that you are short of breath, but never out of breath, for 30 minutes daily. As you improve, it will actually be easier for you to do the same amount of exercise  in  30 minutes so always push to the level where you are short of breath.    GERD (REFLUX)  is an extremely common cause of respiratory symptoms just like yours , many times with no obvious heartburn at all.    It can be treated with medication, but also with lifestyle changes including elevation of the head of your bed (ideally with 6 inch  bed blocks),  Smoking cessation, avoidance of late meals, excessive alcohol, and avoid fatty foods, chocolate, peppermint, colas, red wine, and acidic juices such as orange juice.  NO MINT OR MENTHOL PRODUCTS SO NO COUGH DROPS   USE SUGARLESS CANDY INSTEAD (Jolley ranchers or Stover's or Life Savers) or even ice chips will also do - the key is to swallow to prevent all throat clearing. NO OIL BASED VITAMINS - use powdered substitutes.    Please schedule a follow up office visit in 6 weeks, call sooner if needed   Add needs repeat bmet at ov and cxr and consider repeat ct with contrast

## 2016-09-12 NOTE — Progress Notes (Signed)
Subjective:     Patient ID: Ralph [name], male   DOB: 1950-09-11,     MRN: [id number]  HPI  77 yowm quit smoking in 2015 and w/in 3 weeks onset of wheezing and sob eval by Kathyrn Lass with GOLD II criteria with reversibilty 10/26/2013  At wt 207 and no better since then on multiple rx referred to pulmonary clinic 09/12/2016 by Dr   Kathyrn Lass on maint rx = symbicort 160/singulair/ spiriva/ saba.    09/12/2016 1st [facility] office visit/ Wilsie Kern   Chief Complaint  Patient presents with  . Pulmonary Consult    Referred by Dr. Kathyrn Lass. Pt states he was dxed with Asthma. He c/o SOB when lifting things and also when walking approx 1 mile. He has occ wheezing.   doe x .MMRC2 = can't walk a nl pace on a flat grade s sob but does fine slow and flat eg walk mile at slower pace, struggle with hills worse in cold or damp / not prone to aecopd on present rx   Sleeps fine/ no am cough  No obvious day to day or daytime variability or assoc excess/ purulent sputum or mucus plugs or hemoptysis or cp or chest tightness, subjective wheeze or overt sinus or hb symptoms. No unusual exp hx or h/o childhood pna/ asthma or knowledge of premature birth.  Sleeping ok without nocturnal  or early am exacerbation  of respiratory  c/o's or need for noct saba. Also denies any obvious fluctuation of symptoms with weather or environmental changes or other aggravating or alleviating factors except as outlined above   Current Medications, Allergies, Complete Past Medical History, Past Surgical History, Family History, and Social History were reviewed in [employer] record.  ROS  The following are not active complaints unless bolded sore throat, dysphagia, dental problems, itching, sneezing,  nasal congestion or excess/ purulent secretions, ear ache,   fever, chills, sweats, unintended wt loss, classically pleuritic or exertional cp,  orthopnea pnd or leg swelling, presyncope, palpitations,  abdominal pain, anorexia, nausea, vomiting, diarrhea  or change in bowel or bladder habits, change in stools or urine, dysuria,hematuria,  rash, arthralgias, visual complaints, headache, numbness, weakness or ataxia or problems with walking or coordination,  change in mood/affect or memory.                 Review of Systems     Objective:   Physical Exam    amb slt anxious wm nad    Wt Readings from Last 3 Encounters:  09/12/16 210 lb (95.3 kg)  03/11/16 215 lb (97.5 kg)  01/18/16 217 lb 1.9 oz (98.5 kg)    Vital signs reviewed  - Note on arrival 02 sats  95% on RA     HEENT: nl dentition, turbinates bilaterally, and oropharynx. Nl external ear canals without cough reflex   NECK :  without JVD/Nodes/TM/ nl carotid upstrokes bilaterally   LUNGS: no acc muscle use,  slt barrel contour chest with very minimal insp and exp rhonchi / breath sounds distant    CV:  RRR  no s3 or murmur or increase in P2, and no edema   ABD:  Obese/ soft and nontender with nl inspiratory excursion in the supine position. No bruits or organomegaly appreciated, bowel sounds nl  MS:  Nl gait/ ext warm without deformities, calf tenderness, cyanosis or clubbing No obvious joint restrictions   SKIN: warm and dry without lesions    NEURO:  alert, approp,  nl sensorium with  no motor or cerebellar deficits apparent.     I personally reviewed images and agree with radiology impression as follows:  CT 02/18/16  Chest  (done for coronaries) 1. No acute extracardiac findings in the imaged chest. 2. Low-density structure in the prevascular space is favored to be cystic and represent a benign entity such as a pericardial cyst or bronchogenic cyst. Isolated adenopathy felt less likely. Consider follow-up with diagnostic chest CT at 3 months to confirm stability.      Assessment:

## 2016-09-13 NOTE — Assessment & Plan Note (Addendum)
Quit smoking 2015  PFT's  10/26/13 (p quit smoking)  FEV1 2.13 (58 % ) ratio 59  p 12 % improvement from saba p ? prior to study with DLCO  81 % corrects to 96 % for alv volume  - Spirometry 09/12/2016  FEV1 1.77 (48%)  Ratio 59  p symb x 2  - 09/12/2016  After extensive coaching HFA effectiveness =    90% >  Bevespi  2bid   Pt is Group B in terms of symptom/risk and laba/lama therefore appropriate rx at this point unless he starts having exacerbations in which case I would rec adding ICS back by either changing to Trelegy or reverting to symbicort 160/spiriva RESPIMAT (instead of the dpi, so that all forms of his maint are basically the same exact technique.   He also might benefit from regular paced sub maximal exercise to reverse trend to deconditioning  Formulary restrictions will be an ongoing challenge for the forseable future and I would be happy to pick an alternative if the pt will first  provide me a list of them but pt  will need to return here for training for any new device that is required eg dpi vs hfa vs respimat.    In meantime we can always provide samples so the patient never runs out of any needed respiratory medications.   Total time devoted to counseling  > 50 % of initial 60 min office visit:  review case with pt/ discussion of options/alternatives/ personally creating written customized instructions  in presence of pt  then going over those specific  Instructions directly with the pt including how to use all of the meds but in particular covering each new medication in detail and the difference between the maintenance= "automatic" meds and the prns using an action plan format for the latter (If this problem/symptom => do that organization reading Left to right).  Please see AVS from this visit for a full list of these instructions which I personally wrote for this pt and  are unique to this visit.

## 2016-09-14 DIAGNOSIS — R9389 Abnormal findings on diagnostic imaging of other specified body structures: Secondary | ICD-10-CM | POA: Insufficient documentation

## 2016-09-14 NOTE — Assessment & Plan Note (Signed)
See CT 02/18/16 >  Ideally needs repeat CT with contrast but note creat - rec 09/12/2016 = repeat cxr and bmet and then decide re CT with contrast   Discussed in detail all the  indications, usual  risks and alternatives  relative to the benefits with patient who agrees to proceed with conservative f/u as outlined

## 2016-09-22 ENCOUNTER — Encounter: Payer: Self-pay | Admitting: Internal Medicine

## 2016-09-22 NOTE — Telephone Encounter (Signed)
Fine to restart 10 mg daily

## 2016-09-22 NOTE — Telephone Encounter (Signed)
Dr Melvyn Novas pt would like to start back on singulair due to increased wheezing since he stopped  Please see email  Here are your last AVS instructions:                    Stop singulair, symbicort and spiriva  Plan A = Automatic = Bevespi Take 2 puffs first thing in am and then another 2 puffs about 12 hours later and continue prilosec 20 mg Take 30- 60 min before your first and last meals of the day  Plan B = Backup Only use your albuterol as a rescue medication to be used if you can't catch your breath by resting or doing a relaxed purse lip breathing pattern.  - The less you use it, the better it will work when you need it. - Ok to use the inhaler up to 2 puffs every 4 hours if you must but call for appointment if use goes up over your usual need - Don't leave home without it !! (think of it like the spare tire for your car)  To get the most out of exercise, you need to be continuously aware that you are short of breath, but never out of breath, for 30 minutes daily. As you improve, it will actually be easier for you to do the same amount of exercise in 30 minutes so always push to the level where you are short of breath.  GERD (REFLUX) is an extremely common cause of respiratory symptoms just like yours , many times with no obvious heartburn at all.  It can be treated with medication, but also with lifestyle changes including elevation of the head of your bed (ideally with 6 inch bed blocks), Smoking cessation, avoidance of late meals, excessive alcohol, and avoid fatty foods, chocolate, peppermint, colas, red wine, and acidic juices such as orange juice.  NO MINT OR MENTHOL PRODUCTS SO NO COUGH DROPS  USE SUGARLESS CANDY INSTEAD (Jolley ranchers or Stover's or Life Savers) or even ice chips will also do - the key is to swallow to prevent all throat clearing. NO OIL BASED VITAMINS - use powdered substitutes.  Please schedule a follow up office visit in 6 weeks, call sooner if needed

## 2016-09-23 DIAGNOSIS — Z79899 Other long term (current) drug therapy: Secondary | ICD-10-CM | POA: Diagnosis not present

## 2016-09-23 DIAGNOSIS — J441 Chronic obstructive pulmonary disease with (acute) exacerbation: Secondary | ICD-10-CM | POA: Diagnosis not present

## 2016-10-06 ENCOUNTER — Telehealth: Payer: Self-pay | Admitting: *Deleted

## 2016-10-06 NOTE — Telephone Encounter (Signed)
From patient MyChart:  Nothing is open but the cancellation list. We can add you on the list but you would need to be able to go at a moments notice. Patient declined the waiting list and will keep his Monday 6/25 appointment. ===View-only below this line===   ----- Message -----    From: Dillard Essex. Jacinto    Sent: 09/04/2016  4:28 PM EDT      To: Fransico Him, MD Subject: Non-Urgent Medical Question  Hello, was following up to see if there might be any chance/availability on a date sooner than June? RLS and apnea's getting worse it seems. Thank you

## 2016-10-13 ENCOUNTER — Ambulatory Visit (HOSPITAL_BASED_OUTPATIENT_CLINIC_OR_DEPARTMENT_OTHER): Payer: Medicare Other | Attending: Cardiology | Admitting: Cardiology

## 2016-10-13 DIAGNOSIS — G4733 Obstructive sleep apnea (adult) (pediatric): Secondary | ICD-10-CM | POA: Insufficient documentation

## 2016-10-17 ENCOUNTER — Encounter: Payer: Self-pay | Admitting: Internal Medicine

## 2016-10-17 ENCOUNTER — Ambulatory Visit (INDEPENDENT_AMBULATORY_CARE_PROVIDER_SITE_OTHER): Payer: Medicare Other | Admitting: Internal Medicine

## 2016-10-17 VITALS — BP 152/96 | HR 80 | Ht 72.0 in | Wt 214.2 lb

## 2016-10-17 DIAGNOSIS — R938 Abnormal findings on diagnostic imaging of other specified body structures: Secondary | ICD-10-CM | POA: Diagnosis not present

## 2016-10-17 DIAGNOSIS — I10 Essential (primary) hypertension: Secondary | ICD-10-CM | POA: Diagnosis not present

## 2016-10-17 DIAGNOSIS — J449 Chronic obstructive pulmonary disease, unspecified: Secondary | ICD-10-CM | POA: Diagnosis not present

## 2016-10-17 DIAGNOSIS — R9389 Abnormal findings on diagnostic imaging of other specified body structures: Secondary | ICD-10-CM

## 2016-10-17 MED ORDER — TIOTROPIUM BROMIDE MONOHYDRATE 2.5 MCG/ACT IN AERS: 2.0000 | INHALATION_SPRAY | Freq: Every day | RESPIRATORY_TRACT | 0 refills | Status: DC

## 2016-10-17 MED ORDER — TIOTROPIUM BROMIDE MONOHYDRATE 2.5 MCG/ACT IN AERS: 2.0000 | INHALATION_SPRAY | Freq: Every day | RESPIRATORY_TRACT | 11 refills | Status: DC

## 2016-10-17 NOTE — Progress Notes (Signed)
Subjective:     Patient ID: Ralph [name], male   DOB: Aug 28, 1950,     MRN: [id number]    Brief patient profile:  65 yowm quit smoking in 2015 and w/in 3 weeks onset of wheezing and sob eval by Kathyrn Lass with GOLD II criteria with reversibilty 10/26/2013  At wt 207 and no better since then on multiple rx referred to pulmonary clinic 09/12/2016 by Dr   Kathyrn Lass on maint rx = symbicort 160/singulair/ spiriva/ saba.     History of Present Illness  09/12/2016 1st [facility] office visit/ Ralph [name]   Chief Complaint  Patient presents with  . Pulmonary Consult    Referred by Dr. Kathyrn Lass. Pt states he was dxed with Asthma. He c/o SOB when lifting things and also when walking approx 1 mile. He has occ wheezing.   doe x .MMRC2 = can't walk a nl pace on a flat grade s sob but does fine slow and flat eg walk mile at slower pace, struggle with hills worse in cold or damp / not prone to aecopd on present rx  Sleeps fine/ no am cough rec Stop singulair, symbicort and spiriva Plan A = Automatic =  Bevespi  Take 2 puffs first thing in am and then another 2 puffs about 12 hours later and continue prilosec 20 mg Take 30- 60 min before your first and last meals of the day  Plan B = Backup Only use your albuterol as a rescue medication To get the most out of exercise, you need to be continuously aware that you are short of breath, .   but never out of breath, for 30 minutes daily.  GERD diet   Please schedule a follow up office visit in 6 weeks, call sooner if needed   Add needs repeat bmet at ov and cxr and consider repeat ct with contrast     10/17/2016  f/u ov/[name] re: GOLD III copd/ preferred symb/spiriva dpi  over bevespi Chief Complaint  Patient presents with  . Follow-up    Breathing has improved some. He is using albuterol inhaler 3 x per wk on average.    doe = still MMRC2 as above   Plans f/u with Dr Radford Pax re abn CT chest ? Cyst near heart  No obvious day to day or daytime  variability or assoc excess/ purulent sputum or mucus plugs or hemoptysis or cp or chest tightness, subjective wheeze or overt sinus or hb symptoms. No unusual exp hx or h/o childhood pna/ asthma or knowledge of premature birth.  Sleeping ok without nocturnal  or early am exacerbation  of respiratory  c/o's or need for noct saba. Also denies any obvious fluctuation of symptoms with weather or environmental changes or other aggravating or alleviating factors except as outlined above   Current Medications, Allergies, Complete Past Medical History, Past Surgical History, Family History, and Social History were reviewed in [employer] record.  ROS  The following are not active complaints unless bolded sore throat, dysphagia, dental problems, itching, sneezing,  nasal congestion or excess/ purulent secretions, ear ache,   fever, chills, sweats, unintended wt loss, classically pleuritic or exertional cp,  orthopnea pnd or leg swelling, presyncope, palpitations, abdominal pain, anorexia, nausea, vomiting, diarrhea  or change in bowel or bladder habits, change in stools or urine, dysuria,hematuria,  rash, arthralgias, visual complaints, headache, numbness, weakness or ataxia or problems with walking or coordination,  change in mood/affect or memory.  Objective:   Physical Exam    amb  wm nad     10/17/2016      214   09/12/16 210 lb (95.3 kg)  03/11/16 215 lb (97.5 kg)  01/18/16 217 lb 1.9 oz (98.5 kg)    Vital signs reviewed  - Note on arrival 02 sats  94% on RA and note BP  152/96    HEENT: nl dentition, turbinates bilaterally, and oropharynx. Nl external ear canals without cough reflex   NECK :  without JVD/Nodes/TM/ nl carotid upstrokes bilaterally   LUNGS: no acc muscle use,  slt barrel contour chest / min exp rhonchi bilaterally   CV:  RRR  no s3 or murmur or increase in P2, and no edema   ABD:  Obese/ soft and nontender with nl inspiratory  excursion in the supine position. No bruits or organomegaly appreciated, bowel sounds nl  MS:  Nl gait/ ext warm without deformities, calf tenderness, cyanosis or clubbing No obvious joint restrictions   SKIN: warm and dry without lesions    NEURO:  alert, approp, nl sensorium with  no motor or cerebellar deficits apparent.     I personally reviewed images and agree with radiology impression as follows:  CT 02/18/16  Chest  (done for coronaries) 1. No acute extracardiac findings in the imaged chest. 2. Low-density structure in the prevascular space is favored to be cystic and represent a benign entity such as a pericardial cyst or bronchogenic cyst. Isolated adenopathy felt less likely. Consider follow-up with diagnostic chest CT at 3 months to confirm stability.      Assessment:

## 2016-10-17 NOTE — Patient Instructions (Signed)
Plan A = Automatic = Symbicort 160 Take 2 puffs first thing in am and then another 2 puffs about 12 hours later and spiriva x 2 pffs each am   Plan B = Backup Only use your albuterol as a rescue medication to be used if you can't catch your breath by resting or doing a relaxed purse lip breathing pattern.  - The less you use it, the better it will work when you need it. - Ok to use the inhaler up to 2 puffs  every 4 hours if you must but call for appointment if use goes up over your usual need - Don't leave home without it !!  (think of it like the spare tire for your car)     If you are satisfied with your treatment plan,  let your doctor know and he/she can either refill your medications or you can return here when your prescription runs out.     If in any way you are not 100% satisfied,  please tell us.  If 100% better, tell your friends!  Pulmonary follow up is as needed

## 2016-10-18 NOTE — Procedures (Signed)
   Patient Name: Ralph [name], Ralph [name] Date: 10/13/2016 Gender: Male D.O.B: 06-19-1950 Age (years): 14 Referring Provider: Fransico Him MD, ABSM Height (inches): 72 Interpreting Physician: Fransico Him MD, ABSM Weight (lbs): 215 RPSGT: Madelon Lips BMI: 29 MRN: [id number] Neck Size: 16.00  CLINICAL INFORMATION The patient is referred for a CPAP titration to treat sleep apnea. Date of NPSG, Split Night or HST:03/11/2016  SLEEP STUDY TECHNIQUE As per the AASM Manual for the Scoring of Sleep and Associated Events v2.3 (April 2016) with a hypopnea requiring 4% desaturations.  The channels recorded and monitored were frontal, central and occipital EEG, electrooculogram (EOG), submentalis EMG (chin), nasal and oral airflow, thoracic and abdominal wall motion, anterior tibialis EMG, snore microphone, electrocardiogram, and pulse oximetry. Continuous positive airway pressure (CPAP) was initiated at the beginning of the study and titrated to treat sleep-disordered breathing.  MEDICATIONS Medications self-administered by patient taken the night of the study : CRESTOR, CLONAZEPAM, IBUPROFEN, MONTELUKAST, OMEPRAZOLE, VENTOLIN  TECHNICIAN COMMENTS Comments added by technician: BATHROOM BREAK 1X. INCREASED LEAK NOTED DURING REM  Comments added by scorer: N/A  RESPIRATORY PARAMETERS Optimal PAP Pressure (cm): 12  AHI at Optimal Pressure (/hr):0.0 Overall Minimal O2 (%):88.00  Supine % at Optimal Pressure (%):100 Minimal O2 at Optimal Pressure (%): 92.0    SLEEP ARCHITECTURE The study was initiated at 11:04:07 PM and ended at 5:17:08 AM.  Sleep onset time was 17.9 minutes and the sleep efficiency was 78.5%. The total sleep time was 293.0 minutes.  The patient spent 19.11% of the night in stage N1 sleep, 56.66% in stage N2 sleep, 0.00% in stage N3 and 24.23% in REM.Stage REM latency was 113.5 minutes  Wake after sleep onset was 62.1. Alpha intrusion was absent. Supine sleep was  27.92%.  CARDIAC DATA The 2 lead EKG demonstrated sinus rhythm. The mean heart rate was 68.72 beats per minute. Other EKG findings include: None.  LEG MOVEMENT DATA The total Periodic Limb Movements of Sleep (PLMS) were 0. The PLMS index was 0.00. A PLMS index of <15 is considered normal in adults.  IMPRESSIONS - The optimal PAP pressure was 12 cm of water. - Central sleep apnea was not noted during this titration (CAI = 1.4/h). - Mild oxygen desaturations were observed during this titration (min O2 = 88.00%). - The patient snored with Moderate snoring volume during this titration study. - No cardiac abnormalities were observed during this study. - Clinically significant periodic limb movements were not noted during this study. Arousals associated with PLMs were rare.  DIAGNOSIS - Obstructive Sleep Apnea (327.23 [G47.33 ICD-10])  RECOMMENDATIONS - Trial of CPAP therapy on 12 cm H2O with a Small size Fisher&Paykel Full Face Mask Simplus mask and heated humidification. - Avoid alcohol, sedatives and other CNS depressants that may worsen sleep apnea and disrupt normal sleep architecture. - Sleep hygiene should be reviewed to assess factors that may improve sleep quality. - Weight management and regular exercise should be initiated or continued. - Return to Sleep Center for re-evaluation after 4 weeks of therapy  [place], [place] [facility]  ELECTRONICALLY SIGNED ON:  10/18/2016, 11:54 PM [place] PH: [phone]   FX: [phone] [place]

## 2016-10-18 NOTE — Assessment & Plan Note (Signed)
Noted > cards f/u planned / advised avoid salt

## 2016-10-18 NOTE — Assessment & Plan Note (Addendum)
CT 02/18/16  Low-density structure in the prevascular space is favored to be cystic and represent a benign entity such as a pericardial cyst or bronchogenic cyst. Isolated adenopathy felt less likely. Consider follow-up with diagnostic chest CT at 3 months to confirm stability>  Ideally needs repeat CT with contrast but no recent creatinine   - rec 10/17/2016   repeat cxr and bmet and then decide re CT with contrast > deferred to Dr Radford Pax discussed with pt

## 2016-10-18 NOTE — Assessment & Plan Note (Signed)
Quit smoking 2015  PFT's  10/26/13 (p quit smoking)  FEV1 2.13 (58 % ) ratio 59  p 12 % improvement from saba p ? prior to study with DLCO  81 % corrects to 96 % for alv volume  - Spirometry 09/12/2016  FEV1 1.77 (48%)  Ratio 59  p symb x 2  - 09/12/2016  try  Bevespi  2bid > preferred symb/spiriva  - 10/17/2016  After extensive coaching SMI/ device  effectiveness =    90% > try change to spiriva respimat  Worse symptom control off ics suggests he has AB component c/w  Group D in terms of symptom/risk and laba/lama/ICS  therefore appropriate rx at this point but prefer one technique for all three component plus rescue =>  Change spiriva to respimat form   I had an extended final summary discussion with the patient reviewing all relevant studies completed to date and  lasting 15 to 20 minutes of a 25 minute visit on the following issues:   Reviewed goals of care = stabilize / control longterm symptoms which cannot be cured but are certainly tolerable with pacing/ rehabilitation   Each maintenance medication was reviewed in detail including most importantly the difference between maintenance and as needed and under what circumstances the prns are to be used.  Please see AVS for specific  Instructions which are unique to this visit and I personally typed out  which were reviewed in detail in writing with the patient and a copy provided.

## 2016-10-21 ENCOUNTER — Telehealth: Payer: Self-pay | Admitting: *Deleted

## 2016-10-21 NOTE — Telephone Encounter (Signed)
-----   Message from Sueanne Margarita, MD sent at 10/18/2016 11:57 PM EDT ----- Pt had successful PAP titration. Please setup appointment in 10 weeks. Please let [facility] know that order for PAP is in EPIC.

## 2016-10-21 NOTE — Telephone Encounter (Signed)
Informed patient of sleep study results and patient understanding was verbalized. Patient understands he will be contacted by [facility] to set up his cpap. He understands to call if [facility] does not contact him with new setup in a timely manner. He understands he will be called once confirmation has been received from [facility] that he has received his new machine to schedule 10 week follow up appointment.  [facility] notified of new cpap order in epic Please add to Maryfrances Bunnell He was grateful for the call and thanked me

## 2016-10-23 NOTE — Telephone Encounter (Signed)
Please find out why patient waited so long for CPAP titration.  If due to a family illness then document and let me know otherwise he needs a followup OV with me and a new split night sleep study in the lab

## 2016-10-23 NOTE — Telephone Encounter (Signed)
Per Barbaraann Rondo [facility]) patient had a sleep study 03/11/16 and waited 7 months before having another sleep study. There are no office visits in between the two sleep studies.   Barbaraann Rondo states the patient needs a recent face to face visit with the doctor. Patient also needs a new sleep study. Barbaraann Rondo [facility]) says patient is considered a new starter care unless there is documentation as to why he waited so long between the studies. Patient would have to of been in a skilled facility, in the hospital, taking care of a family member or trying an oral device. Patient states he was in [place] for 2 months and when he returned he was not able to get in to see the doctor for a month. Per Barbaraann Rondo [facility] patient is not eligible for a CPAP.

## 2016-10-23 NOTE — Telephone Encounter (Signed)
Please check back with [facility] to see if being away would qualify and get their guidance how we can proceed based on insurance requirements.  Please let [name] know that we ordered the titration a year ago and he did not follow trough and that's why insurance says they will not pay unless a new study is done.  If he does not follow up with me I cannot order his CPAP   Ralph [name]

## 2016-10-23 NOTE — Telephone Encounter (Addendum)
Informed the patient that he needed a  follow up OV with Dr Radford Pax and a another split night sleep study in lab. Patient was very  unhappy about this and said if our office can't help him get a CPAP machine he will get one on his own. Patient has declined having another sleep study.

## 2016-10-23 NOTE — Telephone Encounter (Addendum)
Per Barbaraann Rondo [facility]) patient had a sleep study 03/11/16 and waited 7 months before having another sleep study. There are no office visits in between the two sleep studies.   Barbaraann Rondo states the patient needs a recent face to face visit with the doctor. Patient also needs a new sleep study. Barbaraann Rondo [facility]) says patient is considered a new starter care unless there is documentation as to why he waited so long between the studies. Patient would have to of been in a skilled facility, in the hospital, taking care of a family member or trying an oral device. Patient states he was in [place] for 2 months and when he returned he was not able to get in to see the doctor for a month. Per Barbaraann Rondo [facility] patient is not eligible for a CPAP.

## 2016-10-23 NOTE — Telephone Encounter (Signed)
Patient states he was in [place] for 2 months and when he returned he was not able to get in to see the doctor for a month.

## 2016-10-29 NOTE — Telephone Encounter (Addendum)
Spoke to [facility]) who states the patient has to start over Per his insurance.  Patient has to have a recent face to face office visit. Have a in lab sleep study. Have a Home Sleep Study or he can private pay in order to get a new cpap.  Spoke to the patient and he wants to think about it before deciding.

## 2016-11-03 ENCOUNTER — Telehealth: Payer: Self-pay | Admitting: Cardiology

## 2016-11-03 NOTE — Telephone Encounter (Signed)
New Message  Sharee Pimple from [employer] call requesting to speak with RN about pts sleep study. Please call back to discuss

## 2016-11-04 ENCOUNTER — Ambulatory Visit: Payer: Medicare Other | Admitting: Cardiology

## 2016-11-04 ENCOUNTER — Other Ambulatory Visit: Payer: Self-pay | Admitting: Internal Medicine

## 2016-11-04 MED ORDER — TIOTROPIUM BROMIDE MONOHYDRATE 2.5 MCG/ACT IN AERS: 2.0000 | INHALATION_SPRAY | Freq: Every day | RESPIRATORY_TRACT | 3 refills | Status: DC

## 2016-11-04 NOTE — Telephone Encounter (Signed)
LM For Ralph [name] to call back.

## 2016-11-05 NOTE — Telephone Encounter (Signed)
Sharee Pimple from [employer] called today to inquire about patient's cpap machine. Sharee Pimple states" patient called [facility] (Dr Sabra Heck) and was very angry stating [facility] (Dr Radford Pax) would not give him a cpap even after he had his sleep study done in June 2018 and he wanted [facility] to take over his sleep management". The cpap assistant Gae Bon explained to Craig that his DME [facility] denied his cpap because of his insurance requirements. The cpap assistant Gae Bon spoke to the patient ON 10/21/16 and let him know that  your DME says "   Per Barbaraann Rondo [facility]) patient had a sleep study 03/11/16 and waited 7 months before having another sleep study. There are no office visits in between the two sleep studies.  Barbaraann Rondo states the patient needs a recent face to face visit with the doctor. Patient also needs a new sleep study. Barbaraann Rondo [facility]) says patient is considered a new starter care unless there is documentation as to why he waited so long between the studies. Patient would have to of been in a skilled facility, in the hospital, taking care of a family member or trying an oral device. Patient states he was in [place] for 2 months and when he returned he was not able to get in to see the doctor for a month. Per Barbaraann Rondo [facility] patient is not eligible for a CPAP".   Spoke to [facility] [place]) who states " the patient has to start over Per his insurance.  Patient has to have a recent face to face office visit. Have a in lab sleep study. Have a Home Sleep Study or he can private pay in order to get a new cpap".  Spoke to the patient and he wants to think about it before deciding.        Dr Radford Pax the sleep doctor says,"    Please check back with [facility] to see if being away would qualify and get their guidance how we can proceed based on insurance requirements.  Please let him know that we ordered the titration a year ago and he did not follow trough and that's why insurance says they will not pay unless a new study is  done.  If he does not follow up with me I cannot order his CPAP".  Sharee Pimple at [facility] says she will reach back out to the patient now that she understands what is going on. Sharee Pimple will also speak to Dr Sabra Heck on how to proceed further.

## 2016-11-06 NOTE — Telephone Encounter (Signed)
Per Sharee Pimple at [facility], patient has decided to start over with a new doctor through [facility] physicians. He has withdrawn his care form [facility].

## 2016-12-19 DIAGNOSIS — I77819 Aortic ectasia, unspecified site: Secondary | ICD-10-CM | POA: Diagnosis not present

## 2016-12-19 DIAGNOSIS — G4761 Periodic limb movement disorder: Secondary | ICD-10-CM | POA: Diagnosis not present

## 2016-12-19 DIAGNOSIS — I10 Essential (primary) hypertension: Secondary | ICD-10-CM | POA: Diagnosis not present

## 2016-12-19 DIAGNOSIS — E663 Overweight: Secondary | ICD-10-CM | POA: Diagnosis not present

## 2016-12-19 DIAGNOSIS — K210 Gastro-esophageal reflux disease with esophagitis: Secondary | ICD-10-CM | POA: Diagnosis not present

## 2016-12-19 DIAGNOSIS — Z0000 Encounter for general adult medical examination without abnormal findings: Secondary | ICD-10-CM | POA: Diagnosis not present

## 2016-12-19 DIAGNOSIS — Z23 Encounter for immunization: Secondary | ICD-10-CM | POA: Diagnosis not present

## 2016-12-19 DIAGNOSIS — E7800 Pure hypercholesterolemia, unspecified: Secondary | ICD-10-CM | POA: Diagnosis not present

## 2016-12-19 DIAGNOSIS — R7303 Prediabetes: Secondary | ICD-10-CM | POA: Diagnosis not present

## 2016-12-19 DIAGNOSIS — Z125 Encounter for screening for malignant neoplasm of prostate: Secondary | ICD-10-CM | POA: Diagnosis not present

## 2016-12-19 DIAGNOSIS — J449 Chronic obstructive pulmonary disease, unspecified: Secondary | ICD-10-CM | POA: Diagnosis not present

## 2017-02-03 DIAGNOSIS — G4761 Periodic limb movement disorder: Secondary | ICD-10-CM | POA: Diagnosis not present

## 2017-02-12 DIAGNOSIS — G4733 Obstructive sleep apnea (adult) (pediatric): Secondary | ICD-10-CM | POA: Diagnosis not present

## 2017-04-16 DIAGNOSIS — G4733 Obstructive sleep apnea (adult) (pediatric): Secondary | ICD-10-CM | POA: Diagnosis not present

## 2017-07-28 DIAGNOSIS — J449 Chronic obstructive pulmonary disease, unspecified: Secondary | ICD-10-CM | POA: Diagnosis not present

## 2017-07-28 DIAGNOSIS — R9389 Abnormal findings on diagnostic imaging of other specified body structures: Secondary | ICD-10-CM | POA: Diagnosis not present

## 2017-07-28 DIAGNOSIS — E7800 Pure hypercholesterolemia, unspecified: Secondary | ICD-10-CM | POA: Diagnosis not present

## 2017-07-28 DIAGNOSIS — K210 Gastro-esophageal reflux disease with esophagitis: Secondary | ICD-10-CM | POA: Diagnosis not present

## 2017-07-28 DIAGNOSIS — I77819 Aortic ectasia, unspecified site: Secondary | ICD-10-CM | POA: Diagnosis not present

## 2017-07-28 DIAGNOSIS — R39198 Other difficulties with micturition: Secondary | ICD-10-CM | POA: Diagnosis not present

## 2017-07-28 DIAGNOSIS — J45909 Unspecified asthma, uncomplicated: Secondary | ICD-10-CM | POA: Diagnosis not present

## 2017-07-28 DIAGNOSIS — I2510 Atherosclerotic heart disease of native coronary artery without angina pectoris: Secondary | ICD-10-CM | POA: Diagnosis not present

## 2017-07-28 DIAGNOSIS — Z23 Encounter for immunization: Secondary | ICD-10-CM | POA: Diagnosis not present

## 2017-07-28 DIAGNOSIS — I10 Essential (primary) hypertension: Secondary | ICD-10-CM | POA: Diagnosis not present

## 2017-07-28 DIAGNOSIS — G4733 Obstructive sleep apnea (adult) (pediatric): Secondary | ICD-10-CM | POA: Diagnosis not present

## 2017-07-28 DIAGNOSIS — E663 Overweight: Secondary | ICD-10-CM | POA: Diagnosis not present

## 2017-08-03 ENCOUNTER — Other Ambulatory Visit: Payer: Self-pay | Admitting: Family Medicine

## 2017-08-03 DIAGNOSIS — R9389 Abnormal findings on diagnostic imaging of other specified body structures: Secondary | ICD-10-CM

## 2017-09-04 DIAGNOSIS — M7062 Trochanteric bursitis, left hip: Secondary | ICD-10-CM | POA: Diagnosis not present

## 2017-09-04 DIAGNOSIS — Z6828 Body mass index (BMI) 28.0-28.9, adult: Secondary | ICD-10-CM | POA: Diagnosis not present

## 2017-09-04 DIAGNOSIS — M533 Sacrococcygeal disorders, not elsewhere classified: Secondary | ICD-10-CM | POA: Diagnosis not present

## 2017-10-20 ENCOUNTER — Other Ambulatory Visit: Payer: Self-pay | Admitting: Family Medicine

## 2017-10-20 DIAGNOSIS — R9389 Abnormal findings on diagnostic imaging of other specified body structures: Secondary | ICD-10-CM

## 2017-12-07 ENCOUNTER — Ambulatory Visit
Admission: RE | Admit: 2017-12-07 | Discharge: 2017-12-07 | Disposition: A | Discharge status: Home / Self Care | Payer: Medicare Other | Source: Ambulatory Visit | Attending: Family Medicine | Admitting: Family Medicine

## 2017-12-07 DIAGNOSIS — R9389 Abnormal findings on diagnostic imaging of other specified body structures: Secondary | ICD-10-CM

## 2017-12-07 DIAGNOSIS — J439 Emphysema, unspecified: Secondary | ICD-10-CM | POA: Diagnosis not present

## 2017-12-28 DIAGNOSIS — J449 Chronic obstructive pulmonary disease, unspecified: Secondary | ICD-10-CM | POA: Diagnosis not present

## 2017-12-28 DIAGNOSIS — R39198 Other difficulties with micturition: Secondary | ICD-10-CM | POA: Diagnosis not present

## 2017-12-28 DIAGNOSIS — E7800 Pure hypercholesterolemia, unspecified: Secondary | ICD-10-CM | POA: Diagnosis not present

## 2017-12-28 DIAGNOSIS — G4733 Obstructive sleep apnea (adult) (pediatric): Secondary | ICD-10-CM | POA: Diagnosis not present

## 2017-12-28 DIAGNOSIS — I10 Essential (primary) hypertension: Secondary | ICD-10-CM | POA: Diagnosis not present

## 2017-12-28 DIAGNOSIS — K210 Gastro-esophageal reflux disease with esophagitis: Secondary | ICD-10-CM | POA: Diagnosis not present

## 2017-12-28 DIAGNOSIS — Z6829 Body mass index (BMI) 29.0-29.9, adult: Secondary | ICD-10-CM | POA: Diagnosis not present

## 2017-12-28 DIAGNOSIS — Z0000 Encounter for general adult medical examination without abnormal findings: Secondary | ICD-10-CM | POA: Diagnosis not present

## 2017-12-28 DIAGNOSIS — Z23 Encounter for immunization: Secondary | ICD-10-CM | POA: Diagnosis not present

## 2017-12-28 DIAGNOSIS — J45909 Unspecified asthma, uncomplicated: Secondary | ICD-10-CM | POA: Diagnosis not present

## 2017-12-28 DIAGNOSIS — Z1389 Encounter for screening for other disorder: Secondary | ICD-10-CM | POA: Diagnosis not present

## 2017-12-28 DIAGNOSIS — G4761 Periodic limb movement disorder: Secondary | ICD-10-CM | POA: Diagnosis not present

## 2018-01-05 DIAGNOSIS — J441 Chronic obstructive pulmonary disease with (acute) exacerbation: Secondary | ICD-10-CM | POA: Diagnosis not present

## 2018-01-14 DIAGNOSIS — Z23 Encounter for immunization: Secondary | ICD-10-CM | POA: Diagnosis not present

## 2018-01-14 DIAGNOSIS — L570 Actinic keratosis: Secondary | ICD-10-CM | POA: Diagnosis not present

## 2018-01-14 DIAGNOSIS — D225 Melanocytic nevi of trunk: Secondary | ICD-10-CM | POA: Diagnosis not present

## 2018-01-14 DIAGNOSIS — L814 Other melanin hyperpigmentation: Secondary | ICD-10-CM | POA: Diagnosis not present

## 2018-01-14 DIAGNOSIS — D2262 Melanocytic nevi of left upper limb, including shoulder: Secondary | ICD-10-CM | POA: Diagnosis not present

## 2018-01-14 DIAGNOSIS — L821 Other seborrheic keratosis: Secondary | ICD-10-CM | POA: Diagnosis not present

## 2018-03-05 DIAGNOSIS — M545 Low back pain: Secondary | ICD-10-CM | POA: Diagnosis not present

## 2018-03-05 DIAGNOSIS — M5442 Lumbago with sciatica, left side: Secondary | ICD-10-CM | POA: Diagnosis not present

## 2018-03-19 DIAGNOSIS — M545 Low back pain: Secondary | ICD-10-CM | POA: Diagnosis not present

## 2018-03-24 DIAGNOSIS — M545 Low back pain: Secondary | ICD-10-CM | POA: Diagnosis not present

## 2018-03-29 DIAGNOSIS — M47816 Spondylosis without myelopathy or radiculopathy, lumbar region: Secondary | ICD-10-CM | POA: Diagnosis not present

## 2018-03-31 DIAGNOSIS — B029 Zoster without complications: Secondary | ICD-10-CM | POA: Diagnosis not present

## 2018-04-07 DIAGNOSIS — B029 Zoster without complications: Secondary | ICD-10-CM | POA: Diagnosis not present

## 2018-04-07 DIAGNOSIS — Z6829 Body mass index (BMI) 29.0-29.9, adult: Secondary | ICD-10-CM | POA: Diagnosis not present

## 2018-04-29 DIAGNOSIS — B0229 Other postherpetic nervous system involvement: Secondary | ICD-10-CM | POA: Diagnosis not present

## 2018-05-05 DIAGNOSIS — Z6830 Body mass index (BMI) 30.0-30.9, adult: Secondary | ICD-10-CM | POA: Diagnosis not present

## 2018-05-05 DIAGNOSIS — B029 Zoster without complications: Secondary | ICD-10-CM | POA: Diagnosis not present

## 2018-05-31 DIAGNOSIS — B029 Zoster without complications: Secondary | ICD-10-CM | POA: Diagnosis not present

## 2018-06-28 DIAGNOSIS — E663 Overweight: Secondary | ICD-10-CM | POA: Diagnosis not present

## 2018-06-28 DIAGNOSIS — B0229 Other postherpetic nervous system involvement: Secondary | ICD-10-CM | POA: Diagnosis not present

## 2018-06-28 DIAGNOSIS — G4733 Obstructive sleep apnea (adult) (pediatric): Secondary | ICD-10-CM | POA: Diagnosis not present

## 2018-06-28 DIAGNOSIS — J449 Chronic obstructive pulmonary disease, unspecified: Secondary | ICD-10-CM | POA: Diagnosis not present

## 2018-06-28 DIAGNOSIS — R454 Irritability and anger: Secondary | ICD-10-CM | POA: Diagnosis not present

## 2018-06-28 DIAGNOSIS — Z6828 Body mass index (BMI) 28.0-28.9, adult: Secondary | ICD-10-CM | POA: Diagnosis not present

## 2018-06-28 DIAGNOSIS — I700 Atherosclerosis of aorta: Secondary | ICD-10-CM | POA: Diagnosis not present

## 2018-06-28 DIAGNOSIS — E7800 Pure hypercholesterolemia, unspecified: Secondary | ICD-10-CM | POA: Diagnosis not present

## 2018-06-28 DIAGNOSIS — G4761 Periodic limb movement disorder: Secondary | ICD-10-CM | POA: Diagnosis not present

## 2018-06-28 DIAGNOSIS — K210 Gastro-esophageal reflux disease with esophagitis: Secondary | ICD-10-CM | POA: Diagnosis not present

## 2018-06-28 DIAGNOSIS — R39198 Other difficulties with micturition: Secondary | ICD-10-CM | POA: Diagnosis not present

## 2018-06-28 DIAGNOSIS — I10 Essential (primary) hypertension: Secondary | ICD-10-CM | POA: Diagnosis not present

## 2018-07-16 DIAGNOSIS — B0229 Other postherpetic nervous system involvement: Secondary | ICD-10-CM | POA: Diagnosis not present

## 2018-07-27 DIAGNOSIS — G4733 Obstructive sleep apnea (adult) (pediatric): Secondary | ICD-10-CM | POA: Diagnosis not present

## 2018-07-29 DIAGNOSIS — B0229 Other postherpetic nervous system involvement: Secondary | ICD-10-CM | POA: Diagnosis not present

## 2018-08-03 DIAGNOSIS — B0229 Other postherpetic nervous system involvement: Secondary | ICD-10-CM | POA: Diagnosis not present

## 2019-01-10 DIAGNOSIS — R454 Irritability and anger: Secondary | ICD-10-CM | POA: Diagnosis not present

## 2019-01-10 DIAGNOSIS — I10 Essential (primary) hypertension: Secondary | ICD-10-CM | POA: Diagnosis not present

## 2019-01-10 DIAGNOSIS — R39198 Other difficulties with micturition: Secondary | ICD-10-CM | POA: Diagnosis not present

## 2019-01-10 DIAGNOSIS — E7800 Pure hypercholesterolemia, unspecified: Secondary | ICD-10-CM | POA: Diagnosis not present

## 2019-01-10 DIAGNOSIS — J449 Chronic obstructive pulmonary disease, unspecified: Secondary | ICD-10-CM | POA: Diagnosis not present

## 2019-01-10 DIAGNOSIS — Z0000 Encounter for general adult medical examination without abnormal findings: Secondary | ICD-10-CM | POA: Diagnosis not present

## 2019-01-18 DIAGNOSIS — I10 Essential (primary) hypertension: Secondary | ICD-10-CM | POA: Diagnosis not present

## 2019-01-18 DIAGNOSIS — E7800 Pure hypercholesterolemia, unspecified: Secondary | ICD-10-CM | POA: Diagnosis not present

## 2019-01-18 DIAGNOSIS — R39198 Other difficulties with micturition: Secondary | ICD-10-CM | POA: Diagnosis not present

## 2019-01-18 DIAGNOSIS — Z23 Encounter for immunization: Secondary | ICD-10-CM | POA: Diagnosis not present

## 2019-01-18 DIAGNOSIS — R3912 Poor urinary stream: Secondary | ICD-10-CM | POA: Diagnosis not present

## 2019-01-21 DIAGNOSIS — Z23 Encounter for immunization: Secondary | ICD-10-CM | POA: Diagnosis not present

## 2019-01-21 DIAGNOSIS — S61210A Laceration without foreign body of right index finger without damage to nail, initial encounter: Secondary | ICD-10-CM | POA: Diagnosis not present

## 2019-02-03 DIAGNOSIS — H5213 Myopia, bilateral: Secondary | ICD-10-CM | POA: Diagnosis not present

## 2019-02-03 DIAGNOSIS — H524 Presbyopia: Secondary | ICD-10-CM | POA: Diagnosis not present

## 2019-02-03 DIAGNOSIS — H52222 Regular astigmatism, left eye: Secondary | ICD-10-CM | POA: Diagnosis not present

## 2019-02-03 DIAGNOSIS — H2513 Age-related nuclear cataract, bilateral: Secondary | ICD-10-CM | POA: Diagnosis not present

## 2019-05-07 DIAGNOSIS — Z20828 Contact with and (suspected) exposure to other viral communicable diseases: Secondary | ICD-10-CM | POA: Diagnosis not present

## 2019-06-17 DIAGNOSIS — M461 Sacroiliitis, not elsewhere classified: Secondary | ICD-10-CM | POA: Diagnosis not present

## 2019-06-17 DIAGNOSIS — M545 Low back pain: Secondary | ICD-10-CM | POA: Diagnosis not present

## 2019-06-17 DIAGNOSIS — M9904 Segmental and somatic dysfunction of sacral region: Secondary | ICD-10-CM | POA: Diagnosis not present

## 2019-06-17 DIAGNOSIS — M9903 Segmental and somatic dysfunction of lumbar region: Secondary | ICD-10-CM | POA: Diagnosis not present

## 2019-07-13 DIAGNOSIS — J449 Chronic obstructive pulmonary disease, unspecified: Secondary | ICD-10-CM | POA: Diagnosis not present

## 2019-07-13 DIAGNOSIS — I10 Essential (primary) hypertension: Secondary | ICD-10-CM | POA: Diagnosis not present

## 2019-07-13 DIAGNOSIS — J45909 Unspecified asthma, uncomplicated: Secondary | ICD-10-CM | POA: Diagnosis not present

## 2019-07-13 DIAGNOSIS — E7800 Pure hypercholesterolemia, unspecified: Secondary | ICD-10-CM | POA: Diagnosis not present

## 2019-07-13 DIAGNOSIS — I2510 Atherosclerotic heart disease of native coronary artery without angina pectoris: Secondary | ICD-10-CM | POA: Diagnosis not present

## 2019-07-13 DIAGNOSIS — J441 Chronic obstructive pulmonary disease with (acute) exacerbation: Secondary | ICD-10-CM | POA: Diagnosis not present

## 2019-07-28 DIAGNOSIS — G4733 Obstructive sleep apnea (adult) (pediatric): Secondary | ICD-10-CM | POA: Diagnosis not present

## 2019-08-02 DIAGNOSIS — Z23 Encounter for immunization: Secondary | ICD-10-CM | POA: Diagnosis not present

## 2019-08-11 DIAGNOSIS — L237 Allergic contact dermatitis due to plants, except food: Secondary | ICD-10-CM | POA: Diagnosis not present

## 2019-08-23 DIAGNOSIS — Z23 Encounter for immunization: Secondary | ICD-10-CM | POA: Diagnosis not present

## 2019-10-12 DIAGNOSIS — I10 Essential (primary) hypertension: Secondary | ICD-10-CM | POA: Diagnosis not present

## 2019-10-12 DIAGNOSIS — J441 Chronic obstructive pulmonary disease with (acute) exacerbation: Secondary | ICD-10-CM | POA: Diagnosis not present

## 2019-10-12 DIAGNOSIS — I2510 Atherosclerotic heart disease of native coronary artery without angina pectoris: Secondary | ICD-10-CM | POA: Diagnosis not present

## 2019-10-12 DIAGNOSIS — E7800 Pure hypercholesterolemia, unspecified: Secondary | ICD-10-CM | POA: Diagnosis not present

## 2019-10-12 DIAGNOSIS — J45909 Unspecified asthma, uncomplicated: Secondary | ICD-10-CM | POA: Diagnosis not present

## 2019-10-12 DIAGNOSIS — J449 Chronic obstructive pulmonary disease, unspecified: Secondary | ICD-10-CM | POA: Diagnosis not present

## 2019-10-26 DIAGNOSIS — E7800 Pure hypercholesterolemia, unspecified: Secondary | ICD-10-CM | POA: Diagnosis not present

## 2019-10-26 DIAGNOSIS — J45909 Unspecified asthma, uncomplicated: Secondary | ICD-10-CM | POA: Diagnosis not present

## 2019-10-26 DIAGNOSIS — J449 Chronic obstructive pulmonary disease, unspecified: Secondary | ICD-10-CM | POA: Diagnosis not present

## 2019-10-26 DIAGNOSIS — I2510 Atherosclerotic heart disease of native coronary artery without angina pectoris: Secondary | ICD-10-CM | POA: Diagnosis not present

## 2019-10-26 DIAGNOSIS — I10 Essential (primary) hypertension: Secondary | ICD-10-CM | POA: Diagnosis not present

## 2019-10-26 DIAGNOSIS — J441 Chronic obstructive pulmonary disease with (acute) exacerbation: Secondary | ICD-10-CM | POA: Diagnosis not present

## 2019-11-25 ENCOUNTER — Emergency Department (HOSPITAL_COMMUNITY)
Admission: EM | Admit: 2019-11-25 | Discharge: 2019-11-25 | Disposition: A | Discharge status: Home / Self Care | Payer: Medicare Other | Attending: Emergency Medicine | Admitting: Emergency Medicine

## 2019-11-25 ENCOUNTER — Emergency Department (HOSPITAL_COMMUNITY): Payer: Medicare Other

## 2019-11-25 ENCOUNTER — Encounter (HOSPITAL_COMMUNITY): Payer: Self-pay

## 2019-11-25 ENCOUNTER — Other Ambulatory Visit: Payer: Self-pay

## 2019-11-25 DIAGNOSIS — S62631A Displaced fracture of distal phalanx of left index finger, initial encounter for closed fracture: Secondary | ICD-10-CM | POA: Diagnosis not present

## 2019-11-25 DIAGNOSIS — R600 Localized edema: Secondary | ICD-10-CM | POA: Diagnosis not present

## 2019-11-25 DIAGNOSIS — M7989 Other specified soft tissue disorders: Secondary | ICD-10-CM | POA: Diagnosis not present

## 2019-11-25 DIAGNOSIS — Y998 Other external cause status: Secondary | ICD-10-CM | POA: Diagnosis not present

## 2019-11-25 DIAGNOSIS — Z7982 Long term (current) use of aspirin: Secondary | ICD-10-CM | POA: Diagnosis not present

## 2019-11-25 DIAGNOSIS — W312XXA Contact with powered woodworking and forming machines, initial encounter: Secondary | ICD-10-CM | POA: Insufficient documentation

## 2019-11-25 DIAGNOSIS — I10 Essential (primary) hypertension: Secondary | ICD-10-CM | POA: Diagnosis not present

## 2019-11-25 DIAGNOSIS — Z91040 Latex allergy status: Secondary | ICD-10-CM | POA: Diagnosis not present

## 2019-11-25 DIAGNOSIS — J45909 Unspecified asthma, uncomplicated: Secondary | ICD-10-CM | POA: Insufficient documentation

## 2019-11-25 DIAGNOSIS — Z23 Encounter for immunization: Secondary | ICD-10-CM | POA: Diagnosis not present

## 2019-11-25 DIAGNOSIS — S62633B Displaced fracture of distal phalanx of left middle finger, initial encounter for open fracture: Secondary | ICD-10-CM | POA: Diagnosis not present

## 2019-11-25 DIAGNOSIS — Y9289 Other specified places as the place of occurrence of the external cause: Secondary | ICD-10-CM | POA: Diagnosis not present

## 2019-11-25 DIAGNOSIS — Z87891 Personal history of nicotine dependence: Secondary | ICD-10-CM | POA: Diagnosis not present

## 2019-11-25 DIAGNOSIS — S61313A Laceration without foreign body of left middle finger with damage to nail, initial encounter: Secondary | ICD-10-CM

## 2019-11-25 DIAGNOSIS — Y9389 Activity, other specified: Secondary | ICD-10-CM | POA: Diagnosis not present

## 2019-11-25 DIAGNOSIS — Z79899 Other long term (current) drug therapy: Secondary | ICD-10-CM | POA: Insufficient documentation

## 2019-11-25 DIAGNOSIS — S62639B Displaced fracture of distal phalanx of unspecified finger, initial encounter for open fracture: Secondary | ICD-10-CM

## 2019-11-25 DIAGNOSIS — S61311A Laceration without foreign body of left index finger with damage to nail, initial encounter: Secondary | ICD-10-CM | POA: Diagnosis not present

## 2019-11-25 DIAGNOSIS — J449 Chronic obstructive pulmonary disease, unspecified: Secondary | ICD-10-CM | POA: Diagnosis not present

## 2019-11-25 DIAGNOSIS — S61213A Laceration without foreign body of left middle finger without damage to nail, initial encounter: Secondary | ICD-10-CM | POA: Diagnosis not present

## 2019-11-25 DIAGNOSIS — S6992XA Unspecified injury of left wrist, hand and finger(s), initial encounter: Secondary | ICD-10-CM | POA: Diagnosis present

## 2019-11-25 DIAGNOSIS — S62633A Displaced fracture of distal phalanx of left middle finger, initial encounter for closed fracture: Secondary | ICD-10-CM | POA: Diagnosis not present

## 2019-11-25 DIAGNOSIS — S61211A Laceration without foreign body of left index finger without damage to nail, initial encounter: Secondary | ICD-10-CM | POA: Diagnosis not present

## 2019-11-25 HISTORY — DX: Disorder of kidney and ureter, unspecified: N28.9

## 2019-11-25 MED ORDER — HYDROCODONE-ACETAMINOPHEN 5-325 MG PO TABS: 1.0000 | ORAL_TABLET | ORAL | 0 refills | Status: DC | PRN

## 2019-11-25 MED ORDER — TETANUS-DIPHTH-ACELL PERTUSSIS 5-2.5-18.5 LF-MCG/0.5 IM SUSP
0.5000 mL | Freq: Once | INTRAMUSCULAR | Status: AC
  Administered 2019-11-25: 0.5 mL via INTRAMUSCULAR
  Filled 2019-11-25: qty 0.5

## 2019-11-25 MED ORDER — LIDOCAINE HCL 2 % IJ SOLN
20.0000 mL | Freq: Once | INTRAMUSCULAR | Status: AC
  Administered 2019-11-25: 400 mg via INTRADERMAL
  Filled 2019-11-25: qty 20

## 2019-11-25 MED ORDER — CEPHALEXIN 500 MG PO CAPS
500.0000 mg | ORAL_CAPSULE | Freq: Four times a day (QID) | ORAL | 0 refills | Status: DC
Start: 2019-11-25 — End: 2020-10-31

## 2019-11-25 NOTE — ED Provider Notes (Signed)
[name] DEPT Provider Note   CSN: [id number] Arrival date & time: 11/25/19  1543     History Chief Complaint  Patient presents with  . finger laceration    Ralph [name] is a 69 y.o. male who is RHD who presents with a finger laceration. He states he was cutting some wood on a table saw and the guard kicked up and he cut his left middle and index fingers. The middle finger is not that bad but the index finger was more severe and he could not get it to stop bleeding so he came to the ED. He washed it with peroxide PTA. He also pulled off part of the nail because it was hanging off. He is unsure of his tetanus status. He is not on blood thinners. He denies numbness, tingling, weakness of the finger.   HPI     Past Medical History:  Diagnosis Date  . Arthritis    hands  . Asthma   . GERD (gastroesophageal reflux disease)   . History of kidney stones    x2  -episodes  . Hyperlipidemia   . Renal disorder   . Varicose veins    bilateral surgeries- no problems now    Patient Active Problem List   Diagnosis Date Noted  . Abnormal CT of the chest 09/14/2016  . COPD GOLD III  09/12/2016  . DOE (dyspnea on exertion) 01/18/2016  . Excessive daytime sleepiness 01/18/2016  . Essential hypertension 01/18/2016    Past Surgical History:  Procedure Laterality Date  . COLONOSCOPY WITH PROPOFOL N/A 03/20/2015   Procedure: COLONOSCOPY WITH PROPOFOL;  Surgeon: Garlan Fair, MD;  Location: [facility];  Service: Endoscopy;  Laterality: N/A;  . HERNIA REPAIR     inguinal hernia  . TONSILLECTOMY    . VARICOSE VEIN SURGERY Bilateral    2 years ago  . VASECTOMY         Family History  Problem Relation Age of Onset  . Dementia Mother   . COPD Father     Social History   Tobacco Use  . Smoking status: Former Smoker    Packs/day: 0.50    Years: 20.00    Pack years: 10.00    Types: Cigarettes    Quit date: 04/21/2013    Years since  quitting: 6.6  . Smokeless tobacco: Never Used  Vaping Use  . Vaping Use: Never used  Substance Use Topics  . Alcohol use: Yes    Comment: social  . Drug use: No    Home Medications Prior to Admission medications   Medication Sig Start Date End Date Taking? Authorizing Provider  albuterol (PROAIR HFA) 108 (90 Base) MCG/ACT inhaler Inhale 2 puffs into the lungs every 6 (six) hours as needed for wheezing or shortness of breath.    [provider]  aspirin EC 81 MG tablet Take 1 tablet (81 mg total) by mouth daily. 02/20/16   Ralph Margarita, MD  budesonide-formoterol (SYMBICORT) 160-4.5 MCG/ACT inhaler Inhale 2 puffs into the lungs 2 (two) times daily.    [provider]  clonazePAM (KLONOPIN) 0.5 MG tablet Take 0.5 mg by mouth at bedtime.    [provider]  escitalopram (LEXAPRO) 20 MG tablet Take 20 mg by mouth daily.    [provider]  ibuprofen (ADVIL,MOTRIN) 200 MG tablet Take 400 mg by mouth every 6 (six) hours as needed for pain or headache.    [provider]  isosorbide mononitrate (IMDUR)  30 MG 24 hr tablet Take 0.5 tablets (15 mg total) by mouth daily. 03/20/16 03/15/17  Ralph Margarita, MD  montelukast (SINGULAIR) 10 MG tablet Take 10 mg by mouth daily. 12/05/15   [provider]  omeprazole (PRILOSEC) 20 MG capsule Take 20 mg by mouth daily.    [provider]  rosuvastatin (CRESTOR) 10 MG tablet Take 1 tablet (10 mg total) by mouth daily. 03/20/16 10/17/16  Ralph Margarita, MD  tamsulosin (FLOMAX) 0.4 MG CAPS capsule Take 0.4 mg by mouth at bedtime.    [provider]  Tiotropium Bromide Monohydrate (SPIRIVA RESPIMAT) 2.5 MCG/ACT AERS Inhale 2 puffs into the lungs daily. 11/04/16   Tanda Rockers, MD    Allergies    Latex and Morphine and related  Review of Systems   Review of Systems  Musculoskeletal: Positive for arthralgias.  Neurological: Negative for weakness and numbness.    Physical  Exam Updated Vital Signs BP 117/80 (BP Location: Left Arm)   Pulse 72   Temp 98.2 F (36.8 C) (Oral)   Resp 16   Ht 6' (1.829 m)   Wt 93 kg   SpO2 96%   BMI 27.80 kg/m   Physical Exam Vitals and nursing note reviewed.  Constitutional:      General: He is not in acute distress.    Appearance: Normal appearance. He is well-developed. He is not ill-appearing.     Comments: Pleasant and cooperative. NAD  HENT:     Head: Normocephalic and atraumatic.  Eyes:     General: No scleral icterus.       Right eye: No discharge.        Left eye: No discharge.     Conjunctiva/sclera: Conjunctivae normal.     Pupils: Pupils are equal, round, and reactive to light.  Cardiovascular:     Rate and Rhythm: Normal rate.  Pulmonary:     Effort: Pulmonary effort is normal. No respiratory distress.  Abdominal:     General: There is no distension.  Musculoskeletal:     Cervical back: Normal range of motion.     Comments: Left index finger: Jagged laceration through the nail bed of the left index finger. The lower part of the nail is missing.   Left middle finger: Skin avulsion at the tip of the middle finger  Skin:    General: Skin is warm and dry.  Neurological:     Mental Status: He is alert and oriented to person, place, and time.  Psychiatric:        Behavior: Behavior normal.            ED Results / Procedures / Treatments   Labs (all labs ordered are listed, but only abnormal results are displayed) Labs Reviewed - No data to display  EKG None  Radiology DG Finger Index Left  Result Date: 11/25/2019 CLINICAL DATA:  Laceration EXAM: LEFT INDEX FINGER 2+V COMPARISON:  None. FINDINGS: There is soft tissue swelling about the distal phalanx with an associated acute, mildly displaced and comminuted fracture through the tuft of the distal phalanx. There is no radiopaque foreign body. There is no dislocation. IMPRESSION: Acute, mildly displaced and comminuted fracture through the  tuft of the distal phalanx of the left index finger. Electronically Signed   By: Constance Holster M.D.   On: 11/25/2019 17:53   DG Finger Middle Left  Result Date: 11/25/2019 CLINICAL DATA:  Laceration EXAM: LEFT MIDDLE FINGER 2+V COMPARISON:  None. FINDINGS: There is  soft tissue swelling about the distal phalanx without evidence for an acute displaced fracture or dislocation. There is no radiopaque foreign body. IMPRESSION: Soft tissue swelling without evidence for an acute displaced fracture or dislocation. Electronically Signed   By: Constance Holster M.D.   On: 11/25/2019 17:52    Procedures .Marland KitchenLaceration Repair  Date/Time: 11/25/2019 11:17 PM Performed by: Recardo Evangelist, PA-C Authorized by: Recardo Evangelist, PA-C   Consent:    Consent obtained:  Verbal   Consent given by:  Patient   Risks discussed:  Infection and pain   Alternatives discussed:  No treatment Anesthesia (see MAR for exact dosages):    Anesthesia method:  Local infiltration   Local anesthetic:  Lidocaine 2% w/o epi Laceration details:    Location:  Finger   Finger location:  L index finger   Length (cm):  3   Depth (mm):  10 Repair type:    Repair type:  Intermediate Pre-procedure details:    Preparation:  Patient was prepped and draped in usual sterile fashion Exploration:    Wound exploration: wound explored through full range of motion and entire depth of wound probed and visualized     Wound extent: underlying fracture     Wound extent: no foreign bodies/material noted and no vascular damage noted     Contaminated: no   Treatment:    Area cleansed with:  Soap and water   Amount of cleaning:  Extensive   Irrigation solution:  Sterile water   Irrigation volume:  1000cc   Irrigation method:  Syringe   Visualized foreign bodies/material removed: no   Skin repair:    Repair method:  Sutures   Suture size:  5-0   Wound skin closure material used: vicryl rapide.   Suture technique:  Simple  interrupted   Number of sutures:  3 Approximation:    Approximation:  Close Post-procedure details:    Dressing:  Sterile dressing and bulky dressing   Patient tolerance of procedure:  Tolerated well, no immediate complications   (including critical care time)    Medications Ordered in ED Medications - No data to display  ED Course  I have reviewed the triage vital signs and the nursing notes.  Pertinent labs & imaging results that were available during my care of the patient were reviewed by me and considered in my medical decision making (see chart for details).  69 year old male presents with a laceration from a table saw today. Xray was obtained and he has an open, comminuted tuft fx. A digital block was performed for pain relief and so we could copiously irrigate the wound. There is not much tissue to suture but 3 absorbable stitches were placed to hold some of it together. His tetanus was updated. Dr. Caralyn Guile with hand surgery was consulted who recommends outpatient f/u and antibiotics. Shared visit with Dr. Alvino Chapel. Will rx Keflex and Norco. He was advised to f/u with Dr. Caralyn Guile on Tuesday.   MDM Rules/Calculators/A&P                           Final Clinical Impression(s) / ED Diagnoses Final diagnoses:  Open fracture of tuft of distal phalanx of finger  Laceration of left middle finger without foreign body with damage to nail, initial encounter    Rx / DC Orders ED Discharge Orders    None       Recardo Evangelist, PA-C 11/25/19 2328  Davonna Belling, MD 11/25/19 [phone]

## 2019-11-25 NOTE — ED Triage Notes (Signed)
Patient cut his left middle and left pointer finger with a table saw.

## 2019-11-25 NOTE — Discharge Instructions (Addendum)
Please call Dr. Angus Palms office on Monday for an appointment on Tuesday Keep dressing on and wound covered at all times. You can change the dressing if it gets soiled or starts to come off Take Norco for pain as needed every 4-6 hours Take Keflex 4 times a day for 5 days to prevent infection. Take with food Please return to the ER if you are worsening

## 2019-11-29 DIAGNOSIS — S61211A Laceration without foreign body of left index finger without damage to nail, initial encounter: Secondary | ICD-10-CM | POA: Diagnosis not present

## 2019-11-29 DIAGNOSIS — S62631B Displaced fracture of distal phalanx of left index finger, initial encounter for open fracture: Secondary | ICD-10-CM | POA: Diagnosis not present

## 2019-12-16 DIAGNOSIS — J449 Chronic obstructive pulmonary disease, unspecified: Secondary | ICD-10-CM | POA: Diagnosis not present

## 2019-12-16 DIAGNOSIS — J441 Chronic obstructive pulmonary disease with (acute) exacerbation: Secondary | ICD-10-CM | POA: Diagnosis not present

## 2019-12-16 DIAGNOSIS — I10 Essential (primary) hypertension: Secondary | ICD-10-CM | POA: Diagnosis not present

## 2019-12-16 DIAGNOSIS — J45909 Unspecified asthma, uncomplicated: Secondary | ICD-10-CM | POA: Diagnosis not present

## 2019-12-16 DIAGNOSIS — I2510 Atherosclerotic heart disease of native coronary artery without angina pectoris: Secondary | ICD-10-CM | POA: Diagnosis not present

## 2019-12-16 DIAGNOSIS — E7800 Pure hypercholesterolemia, unspecified: Secondary | ICD-10-CM | POA: Diagnosis not present

## 2019-12-30 DIAGNOSIS — S62631B Displaced fracture of distal phalanx of left index finger, initial encounter for open fracture: Secondary | ICD-10-CM | POA: Diagnosis not present

## 2020-01-27 DIAGNOSIS — Z0000 Encounter for general adult medical examination without abnormal findings: Secondary | ICD-10-CM | POA: Diagnosis not present

## 2020-01-27 DIAGNOSIS — Z1389 Encounter for screening for other disorder: Secondary | ICD-10-CM | POA: Diagnosis not present

## 2020-01-27 DIAGNOSIS — Z23 Encounter for immunization: Secondary | ICD-10-CM | POA: Diagnosis not present

## 2020-02-27 ENCOUNTER — Other Ambulatory Visit: Payer: Self-pay | Admitting: Family Medicine

## 2020-02-27 DIAGNOSIS — J45909 Unspecified asthma, uncomplicated: Secondary | ICD-10-CM | POA: Diagnosis not present

## 2020-02-27 DIAGNOSIS — E559 Vitamin D deficiency, unspecified: Secondary | ICD-10-CM | POA: Diagnosis not present

## 2020-02-27 DIAGNOSIS — R454 Irritability and anger: Secondary | ICD-10-CM | POA: Diagnosis not present

## 2020-02-27 DIAGNOSIS — R739 Hyperglycemia, unspecified: Secondary | ICD-10-CM | POA: Diagnosis not present

## 2020-02-27 DIAGNOSIS — Q248 Other specified congenital malformations of heart: Secondary | ICD-10-CM | POA: Diagnosis not present

## 2020-02-27 DIAGNOSIS — E663 Overweight: Secondary | ICD-10-CM | POA: Diagnosis not present

## 2020-02-27 DIAGNOSIS — I10 Essential (primary) hypertension: Secondary | ICD-10-CM | POA: Diagnosis not present

## 2020-02-27 DIAGNOSIS — J449 Chronic obstructive pulmonary disease, unspecified: Secondary | ICD-10-CM | POA: Diagnosis not present

## 2020-02-27 DIAGNOSIS — I700 Atherosclerosis of aorta: Secondary | ICD-10-CM | POA: Diagnosis not present

## 2020-02-27 DIAGNOSIS — R252 Cramp and spasm: Secondary | ICD-10-CM | POA: Diagnosis not present

## 2020-02-27 DIAGNOSIS — B0229 Other postherpetic nervous system involvement: Secondary | ICD-10-CM | POA: Diagnosis not present

## 2020-02-27 DIAGNOSIS — K2100 Gastro-esophageal reflux disease with esophagitis, without bleeding: Secondary | ICD-10-CM | POA: Diagnosis not present

## 2020-02-27 DIAGNOSIS — Z6829 Body mass index (BMI) 29.0-29.9, adult: Secondary | ICD-10-CM | POA: Diagnosis not present

## 2020-03-14 ENCOUNTER — Other Ambulatory Visit: Payer: BLUE CROSS/BLUE SHIELD

## 2020-03-14 ENCOUNTER — Ambulatory Visit
Admission: RE | Admit: 2020-03-14 | Discharge: 2020-03-14 | Disposition: A | Discharge status: Home / Self Care | Payer: Medicare Other | Source: Ambulatory Visit | Attending: Family Medicine | Admitting: Family Medicine

## 2020-03-14 DIAGNOSIS — I700 Atherosclerosis of aorta: Secondary | ICD-10-CM | POA: Diagnosis not present

## 2020-03-14 DIAGNOSIS — J9859 Other diseases of mediastinum, not elsewhere classified: Secondary | ICD-10-CM | POA: Diagnosis not present

## 2020-03-14 DIAGNOSIS — I2510 Atherosclerotic heart disease of native coronary artery without angina pectoris: Secondary | ICD-10-CM | POA: Diagnosis not present

## 2020-03-14 DIAGNOSIS — Q248 Other specified congenital malformations of heart: Secondary | ICD-10-CM

## 2020-03-18 DIAGNOSIS — M79672 Pain in left foot: Secondary | ICD-10-CM | POA: Diagnosis not present

## 2020-03-27 DIAGNOSIS — J449 Chronic obstructive pulmonary disease, unspecified: Secondary | ICD-10-CM | POA: Diagnosis not present

## 2020-03-27 DIAGNOSIS — J45909 Unspecified asthma, uncomplicated: Secondary | ICD-10-CM | POA: Diagnosis not present

## 2020-03-27 DIAGNOSIS — I10 Essential (primary) hypertension: Secondary | ICD-10-CM | POA: Diagnosis not present

## 2020-03-27 DIAGNOSIS — E7800 Pure hypercholesterolemia, unspecified: Secondary | ICD-10-CM | POA: Diagnosis not present

## 2020-03-27 DIAGNOSIS — I2510 Atherosclerotic heart disease of native coronary artery without angina pectoris: Secondary | ICD-10-CM | POA: Diagnosis not present

## 2020-03-27 DIAGNOSIS — J441 Chronic obstructive pulmonary disease with (acute) exacerbation: Secondary | ICD-10-CM | POA: Diagnosis not present

## 2020-04-01 DIAGNOSIS — Z23 Encounter for immunization: Secondary | ICD-10-CM | POA: Diagnosis not present

## 2020-10-25 DIAGNOSIS — S0003XA Contusion of scalp, initial encounter: Secondary | ICD-10-CM | POA: Diagnosis not present

## 2020-10-25 DIAGNOSIS — L03032 Cellulitis of left toe: Secondary | ICD-10-CM | POA: Diagnosis not present

## 2020-10-27 DIAGNOSIS — S92422A Displaced fracture of distal phalanx of left great toe, initial encounter for closed fracture: Secondary | ICD-10-CM | POA: Diagnosis not present

## 2020-10-27 DIAGNOSIS — L0889 Other specified local infections of the skin and subcutaneous tissue: Secondary | ICD-10-CM | POA: Diagnosis not present

## 2020-10-27 DIAGNOSIS — S92532A Displaced fracture of distal phalanx of left lesser toe(s), initial encounter for closed fracture: Secondary | ICD-10-CM | POA: Diagnosis not present

## 2020-10-30 DIAGNOSIS — L03032 Cellulitis of left toe: Secondary | ICD-10-CM | POA: Diagnosis not present

## 2020-10-31 ENCOUNTER — Encounter (HOSPITAL_COMMUNITY): Payer: Self-pay

## 2020-10-31 ENCOUNTER — Emergency Department (HOSPITAL_COMMUNITY): Payer: Medicare Other

## 2020-10-31 ENCOUNTER — Other Ambulatory Visit: Payer: Self-pay

## 2020-10-31 ENCOUNTER — Emergency Department (HOSPITAL_COMMUNITY)
Admission: EM | Admit: 2020-10-31 | Discharge: 2020-10-31 | Disposition: A | Discharge status: Home / Self Care | Payer: Medicare Other | Attending: Emergency Medicine | Admitting: Emergency Medicine

## 2020-10-31 DIAGNOSIS — Z87891 Personal history of nicotine dependence: Secondary | ICD-10-CM | POA: Insufficient documentation

## 2020-10-31 DIAGNOSIS — Z91040 Latex allergy status: Secondary | ICD-10-CM | POA: Insufficient documentation

## 2020-10-31 DIAGNOSIS — Z7982 Long term (current) use of aspirin: Secondary | ICD-10-CM | POA: Diagnosis not present

## 2020-10-31 DIAGNOSIS — J449 Chronic obstructive pulmonary disease, unspecified: Secondary | ICD-10-CM | POA: Diagnosis not present

## 2020-10-31 DIAGNOSIS — I10 Essential (primary) hypertension: Secondary | ICD-10-CM | POA: Insufficient documentation

## 2020-10-31 DIAGNOSIS — W1839XA Other fall on same level, initial encounter: Secondary | ICD-10-CM | POA: Insufficient documentation

## 2020-10-31 DIAGNOSIS — S92902B Unspecified fracture of left foot, initial encounter for open fracture: Secondary | ICD-10-CM

## 2020-10-31 DIAGNOSIS — Z79899 Other long term (current) drug therapy: Secondary | ICD-10-CM | POA: Insufficient documentation

## 2020-10-31 DIAGNOSIS — J45909 Unspecified asthma, uncomplicated: Secondary | ICD-10-CM | POA: Insufficient documentation

## 2020-10-31 DIAGNOSIS — L03119 Cellulitis of unspecified part of limb: Secondary | ICD-10-CM | POA: Diagnosis not present

## 2020-10-31 DIAGNOSIS — S92422B Displaced fracture of distal phalanx of left great toe, initial encounter for open fracture: Secondary | ICD-10-CM | POA: Insufficient documentation

## 2020-10-31 DIAGNOSIS — S99922A Unspecified injury of left foot, initial encounter: Secondary | ICD-10-CM | POA: Diagnosis present

## 2020-10-31 DIAGNOSIS — S92512A Displaced fracture of proximal phalanx of left lesser toe(s), initial encounter for closed fracture: Secondary | ICD-10-CM | POA: Diagnosis not present

## 2020-10-31 DIAGNOSIS — S9305XA Dislocation of left ankle joint, initial encounter: Secondary | ICD-10-CM | POA: Diagnosis not present

## 2020-10-31 LAB — CBC WITH DIFFERENTIAL/PLATELET
Abs Immature Granulocytes: 0.02 10*3/uL (ref 0.00–0.07)
Basophils Absolute: 0 10*3/uL (ref 0.0–0.1)
Basophils Relative: 0 %
Eosinophils Absolute: 0.3 10*3/uL (ref 0.0–0.5)
Eosinophils Relative: 3 %
HCT: 43.5 % (ref 39.0–52.0)
Hemoglobin: 14.8 g/dL (ref 13.0–17.0)
Immature Granulocytes: 0 %
Lymphocytes Relative: 16 %
Lymphs Abs: 1.4 10*3/uL (ref 0.7–4.0)
MCH: 35.2 pg — ABNORMAL HIGH (ref 26.0–34.0)
MCHC: 34 g/dL (ref 30.0–36.0)
MCV: 103.6 fL — ABNORMAL HIGH (ref 80.0–100.0)
Monocytes Absolute: 1 10*3/uL (ref 0.1–1.0)
Monocytes Relative: 10 %
Neutro Abs: 6.5 10*3/uL (ref 1.7–7.7)
Neutrophils Relative %: 71 %
Platelets: 237 10*3/uL (ref 150–400)
RBC: 4.2 MIL/uL — ABNORMAL LOW (ref 4.22–5.81)
RDW: 12.3 % (ref 11.5–15.5)
WBC: 9.3 10*3/uL (ref 4.0–10.5)
nRBC: 0 % (ref 0.0–0.2)

## 2020-10-31 LAB — COMPREHENSIVE METABOLIC PANEL
ALT: 27 U/L (ref 0–44)
AST: 21 U/L (ref 15–41)
Albumin: 4.2 g/dL (ref 3.5–5.0)
Alkaline Phosphatase: 52 U/L (ref 38–126)
Anion gap: 7 (ref 5–15)
BUN: 22 mg/dL (ref 8–23)
CO2: 27 mmol/L (ref 22–32)
Calcium: 9.5 mg/dL (ref 8.9–10.3)
Chloride: 106 mmol/L (ref 98–111)
Creatinine, Ser: 0.98 mg/dL (ref 0.61–1.24)
GFR, Estimated: >60 mL/min (ref >60)
Glucose, Bld: 122 mg/dL — ABNORMAL HIGH (ref 70–99)
Potassium: 4.3 mmol/L (ref 3.5–5.1)
Sodium: 140 mmol/L (ref 135–145)
Total Bilirubin: 0.6 mg/dL (ref 0.3–1.2)
Total Protein: 7 g/dL (ref 6.5–8.1)

## 2020-10-31 MED ORDER — FENTANYL CITRATE (PF) 100 MCG/2ML IJ SOLN
50.0000 ug | Freq: Once | INTRAMUSCULAR | Status: AC
  Administered 2020-10-31: 50 ug via INTRAVENOUS
  Filled 2020-10-31: qty 2

## 2020-10-31 MED ORDER — DOXYCYCLINE HYCLATE 100 MG PO CAPS: 100.0000 mg | ORAL_CAPSULE | Freq: Two times a day (BID) | ORAL | 0 refills | Status: AC

## 2020-10-31 MED ORDER — SODIUM CHLORIDE 0.9 % IV SOLN
2.0000 g | INTRAVENOUS | Status: AC
  Administered 2020-10-31: 2 g via INTRAVENOUS
  Filled 2020-10-31: qty 2

## 2020-10-31 MED ORDER — FENTANYL CITRATE (PF) 100 MCG/2ML IJ SOLN: 50.0000 ug | Freq: Once | INTRAMUSCULAR | Status: DC

## 2020-10-31 MED ORDER — AMOXICILLIN-POT CLAVULANATE 875-125 MG PO TABS: 1.0000 | ORAL_TABLET | Freq: Two times a day (BID) | ORAL | 0 refills | Status: AC

## 2020-10-31 MED ORDER — TETANUS-DIPHTH-ACELL PERTUSSIS 5-2.5-18.5 LF-MCG/0.5 IM SUSY
0.5000 mL | PREFILLED_SYRINGE | Freq: Once | INTRAMUSCULAR | Status: DC
  Filled 2020-10-31: qty 0.5

## 2020-10-31 MED ORDER — CEFAZOLIN SODIUM-DEXTROSE 2-4 GM/100ML-% IV SOLN
2.0000 g | INTRAVENOUS | Status: DC
  Filled 2020-10-31: qty 100

## 2020-10-31 MED ORDER — HYDROCODONE-ACETAMINOPHEN 5-325 MG PO TABS: 2.0000 | ORAL_TABLET | ORAL | 0 refills | Status: DC | PRN

## 2020-10-31 NOTE — Discharge Instructions (Addendum)
You were seen in the ER today for the infection in your foot where your toe was broken.  You administered your first dose of IV antibiotics in the emergency department.  Your case was discussed with [facility] team.  You will be seen in the office by Dr. Doran Durand, a foot specialist either tomorrow or Friday morning at the latest for reevaluation of your foot.  Below is contact information for his office, please call their office first thing in the morning to discuss what time you should present to the office.  You should continue taking your doxycycline as previously prescribed; I have prescribed you 3 extra days to complete a 10-day course of doxycycline.  Additionally you have been prescribed a new antibiotic called Augmentin to take twice a day for the next 10 days in addition to the doxycycline.  Please take these as prescribed for the entire course, unless directed to do otherwise by the specialist, Dr. Doran Durand or his team at [facility].  Return to ER if develop any extension of the redness of your foot beyond the lines drawn today, increased discoloration of your foot, worsening pain, nausea or vomiting, fevers or chills, or any other new severe symptoms.

## 2020-10-31 NOTE — ED Provider Notes (Signed)
[name] Provider Note   CSN: [id number] Arrival date & time: 10/31/20  1727     History Chief Complaint  Patient presents with   Toe Fracture   Cellulitis    Ralph [name] is a 70 y.o. male who presents on referral from [facility] urgent care for IV antibiotics for open fracture of the left great toe.  He had a fall on 10/22/2020 at his [place] in [place] and was seen on 7/7 for great toe pain.  He was diagnosed with open fracture of that toe and started on Augmentin.  His pain progressed into 3 days later on 7/10 he was seen at a different urgent care who stopped his Augmentin and started him on doxycycline which he has been taking as prescribed since that time.  He states that he was seen today because he is returned to town and was seen at [facility] urgent care and sent to the emergency department due to apparent progression of his infection.  He denies any fevers, chills, nausea, vomiting but does endorse significant pain in the foot that is not managed with ibuprofen or Percocet at home.  He denies any numbness, tingling, weakness in his foot, states he has been walking on it in the postoperative shoe the he was given, there is worse pain when he is walking.  There is no pain in his ankle or calf.  I personally reviewed this patient's medical records.  He has history of COPD, hypertension, hyperlipidemia, and GERD. UTD on tetanus, last administered in 2021.   HPI     Past Medical History:  Diagnosis Date   Arthritis    hands   Asthma    GERD (gastroesophageal reflux disease)    History of kidney stones    x2  -episodes   Hyperlipidemia    Renal disorder    Varicose veins    bilateral surgeries- no problems now    Patient Active Problem List   Diagnosis Date Noted   Abnormal CT of the chest 09/14/2016   COPD GOLD III  09/12/2016   DOE (dyspnea on exertion) 01/18/2016   Excessive daytime sleepiness 01/18/2016    Essential hypertension 01/18/2016    Past Surgical History:  Procedure Laterality Date   COLONOSCOPY WITH PROPOFOL N/A 03/20/2015   Procedure: COLONOSCOPY WITH PROPOFOL;  Surgeon: Garlan Fair, MD;  Location: [facility];  Service: Endoscopy;  Laterality: N/A;   HERNIA REPAIR     inguinal hernia   TONSILLECTOMY     VARICOSE VEIN SURGERY Bilateral    2 years ago   VASECTOMY         Family History  Problem Relation Age of Onset   Dementia Mother    COPD Father     Social History   Tobacco Use   Smoking status: Former    Packs/day: 0.50    Years: 20.00    Pack years: 10.00    Types: Cigarettes    Quit date: 04/21/2013    Years since quitting: 7.5   Smokeless tobacco: Never  Vaping Use   Vaping Use: Never used  Substance Use Topics   Alcohol use: Yes    Comment: social   Drug use: No    Home Medications Prior to Admission medications   Medication Sig Start Date End Date Taking? Authorizing Provider  albuterol (PROAIR HFA) 108 (90 Base) MCG/ACT inhaler Inhale 2 puffs into the lungs every 6 (six) hours as needed for wheezing or shortness of breath.  [provider]  aspirin EC 81 MG tablet Take 1 tablet (81 mg total) by mouth daily. 02/20/16   Ralph Margarita, MD  budesonide-formoterol (SYMBICORT) 160-4.5 MCG/ACT inhaler Inhale 2 puffs into the lungs 2 (two) times daily.    [provider]  cephALEXin (KEFLEX) 500 MG capsule Take 1 capsule (500 mg total) by mouth 4 (four) times daily. 11/25/19   Recardo Evangelist, PA-C  clonazePAM (KLONOPIN) 0.5 MG tablet Take 0.5 mg by mouth at bedtime.    [provider]  escitalopram (LEXAPRO) 20 MG tablet Take 20 mg by mouth daily.    [provider]  HYDROcodone-acetaminophen (NORCO/VICODIN) 5-325 MG tablet Take 1 tablet by mouth every 4 (four) hours as needed. 11/25/19   Recardo Evangelist, PA-C  ibuprofen (ADVIL,MOTRIN) 200 MG tablet Take 400 mg by mouth every 6 (six) hours as needed for pain  or headache.    [provider]  isosorbide mononitrate (IMDUR) 30 MG 24 hr tablet Take 0.5 tablets (15 mg total) by mouth daily. 03/20/16 03/15/17  Ralph Margarita, MD  montelukast (SINGULAIR) 10 MG tablet Take 10 mg by mouth daily. 12/05/15   [provider]  omeprazole (PRILOSEC) 20 MG capsule Take 20 mg by mouth daily.    [provider]  rosuvastatin (CRESTOR) 10 MG tablet Take 1 tablet (10 mg total) by mouth daily. 03/20/16 10/17/16  Ralph Margarita, MD  tamsulosin (FLOMAX) 0.4 MG CAPS capsule Take 0.4 mg by mouth at bedtime.    [provider]  Tiotropium Bromide Monohydrate (SPIRIVA RESPIMAT) 2.5 MCG/ACT AERS Inhale 2 puffs into the lungs daily. 11/04/16   Tanda Rockers, MD    Allergies    Latex and Morphine and related  Review of Systems   Review of Systems  Constitutional: Negative.   HENT: Negative.    Respiratory: Negative.    Cardiovascular: Negative.   Gastrointestinal: Negative.   Musculoskeletal:  Positive for joint swelling.  Skin:  Positive for wound.  Allergic/Immunologic: Negative for immunocompromised state.  Neurological: Negative.    Physical Exam Updated Vital Signs BP (!) 151/89 (BP Location: Right Arm)   Pulse 70   Temp 98 F (36.7 C) (Oral)   Resp 16   Ht 6' (1.829 m)   Wt 93 kg   SpO2 98%   BMI 27.81 kg/m   Physical Exam Vitals and nursing note reviewed.  Constitutional:      Appearance: He is not ill-appearing or toxic-appearing.  HENT:     Head: Normocephalic and atraumatic.     Nose: Nose normal.     Mouth/Throat:     Mouth: Mucous membranes are moist.     Pharynx: Oropharynx is clear. Uvula midline. No oropharyngeal exudate, posterior oropharyngeal erythema or uvula swelling.     Tonsils: No tonsillar exudate.  Eyes:     General: Lids are normal. Vision grossly intact.        Right eye: No discharge.        Left eye: No discharge.     Extraocular Movements: Extraocular movements intact.      Conjunctiva/sclera: Conjunctivae normal.     Pupils: Pupils are equal, round, and reactive to light.  Neck:     Trachea: Trachea and phonation normal.  Cardiovascular:     Rate and Rhythm: Normal rate and regular rhythm.     Pulses: Normal pulses.     Heart sounds: Normal heart sounds. No murmur heard. Pulmonary:     Effort: Pulmonary  effort is normal. No tachypnea, bradypnea, accessory muscle usage, prolonged expiration or respiratory distress.     Breath sounds: Normal breath sounds. No wheezing or rales.  Chest:     Chest wall: No mass, lacerations, deformity, swelling, tenderness, crepitus or edema.  Abdominal:     General: Bowel sounds are normal. There is no distension.     Palpations: Abdomen is soft.     Tenderness: There is no abdominal tenderness. There is no right CVA tenderness, left CVA tenderness, guarding or rebound.  Musculoskeletal:        General: No deformity.     Cervical back: Normal range of motion and neck supple. No edema, rigidity or crepitus. No pain with movement, spinous process tenderness or muscular tenderness.     Right knee: Normal.     Left knee: Normal.     Right lower leg: Normal. No edema.     Left lower leg: Normal. No edema.     Right ankle:     Right Achilles Tendon: Normal.     Left ankle: Swelling present. No deformity. Tenderness present. No medial malleolus tenderness. Normal range of motion. Anterior drawer test negative.     Left Achilles Tendon: Normal.     Right foot: Normal.     Left foot: Normal capillary refill. Swelling, tenderness and bony tenderness present. No crepitus. Normal pulse.     Comments: 2+ pedal pulse on the right, 1+ pedal pulse on the left, normal cap refill bilaterally.   Erythema, induration, and mild discoloration of the left foot extending onto the medial malleolus.  Please see photos below.  Marking pen administered in the emergency department today.  Lymphadenopathy:     Cervical: No cervical adenopathy.   Skin:    General: Skin is warm and dry.     Capillary Refill: Capillary refill takes less than 2 seconds.  Neurological:     General: No focal deficit present.     Mental Status: He is alert and oriented to person, place, and time. Mental status is at baseline.     Sensory: Sensation is intact.     Motor: Motor function is intact.  Psychiatric:        Mood and Affect: Mood normal.            ED Results / Procedures / Treatments   Labs (all labs ordered are listed, but only abnormal results are displayed) Labs Reviewed - No data to display  EKG None  Radiology No results found.  Procedures Procedures   Medications Ordered in ED Medications - No data to display  ED Course  I have reviewed the triage vital signs and the nursing notes.  Pertinent labs & imaging results that were available during my care of the patient were reviewed by me and considered in my medical decision making (see chart for details).  Clinical Course as of 10/31/20 2056  Wed Oct 31, 2020  [id number] Consult call received from [facility] APP, Hilliard Clark, who will decongest patient's chart and call me back with recommendations regarding disposition plan. I appreciate his collaboration in the care of this patient.   [RS]    Clinical Course User Index [RS] Alby Schwabe, Sharlene Dory   MDM Rules/Calculators/A&P                         70 year old male presents with concern for open fracture of the left great toe with associated cellulitis.  Differential diagnosis includes but is  not limited to cellulitis, erysipelas, folliculitis, skin abscess, osteomyelitis, erysipelas.   Hypertensive on intake, vital signs otherwise normal.  Cardiopulmonary exam is normal abdominal exam is benign.  Examination of the foot revealed erythema, induration, discoloration of the left foot, with 1+ pedal pulse on that side and normal capillary refill.  Motor function is normal.  There is some scabbing around the base of the  left great toenail, evidence of prior open wound on top of fracture.  CBC without leukocytosis, unremarkable.  CMP unremarkable.  Plain film of the foot was obtained which revealed a dorsal plate avulsion type fracture of the distal phalanx of the left great toe and healing fracture of the fourth proximal phalanx.  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  No subcutaneous air.  Extensive discussion with [facility] APP, Fenton Foy, who recommends proceeding with outpatient antibiotics, per pharmacy recommendation.  He is arranging close outpatient follow-up with Dr. Doran Durand, foot specialist at [facility] for tomorrow morning or Friday morning.  Patient should expect a call from Dr. Nona Dell office, however I will still have him contact the office first thing tomorrow morning.  Did call and discuss antibiotic choice with pharmacist who recommends proceeding with completion of doxycycline course and starting new 10-day Augmentin course.  Appreciate her collaboration in the care of this patient.    Extensive discussion with attending physician regarding risks versus benefits of admission versus benefit of close outpatient follow-up.  Given Ortho would likely not see the patient inpatient until tomorrow, and patient has no systemic symptoms, reassuring blood work, and close follow-up in the outpatient setting, feel disposition home tonight is reasonable.  No further work-up is warranted needed this time.  Kalel voiced understanding of his medical evaluation and treatment plan.  Each of his questions was answered to his expressed satisfaction.  Strict return precautions are given.  Patient is well-appearing, stable, and appropriate for discharge at this time.  This chart was dictated using voice recognition software, Dragon. Despite the best efforts of this provider to proofread and correct errors, errors may still occur which can change documentation meaning.  Final Clinical Impression(s) /  ED Diagnoses Final diagnoses:  None    Rx / DC Orders ED Discharge Orders     None        Aura Dials 10/31/20 2059    Wyvonnia Dusky, MD 10/31/20 2211

## 2020-10-31 NOTE — ED Notes (Signed)
Pt verbalized understanding of d/c, medication, and follow up care. Ambulatory with steady gait.  Pt removed his PIV prior to d/c because he stated he was ready to go. Last dose of IV fentanyl not given.

## 2020-10-31 NOTE — ED Notes (Signed)
Patient transported to X-ray 

## 2020-10-31 NOTE — ED Triage Notes (Signed)
Pt here from [facility]. Pt has paperwork with him stating he "needs IV antibiotics, open fx of left great toe with cellulitis of toe foot". Pt has brace on left foot.

## 2020-11-05 DIAGNOSIS — S92512D Displaced fracture of proximal phalanx of left lesser toe(s), subsequent encounter for fracture with routine healing: Secondary | ICD-10-CM | POA: Diagnosis not present

## 2020-11-05 DIAGNOSIS — S92425B Nondisplaced fracture of distal phalanx of left great toe, initial encounter for open fracture: Secondary | ICD-10-CM | POA: Diagnosis not present

## 2020-11-05 DIAGNOSIS — L03119 Cellulitis of unspecified part of limb: Secondary | ICD-10-CM | POA: Diagnosis not present

## 2020-11-05 DIAGNOSIS — L03032 Cellulitis of left toe: Secondary | ICD-10-CM | POA: Diagnosis not present

## 2020-11-05 DIAGNOSIS — S92422D Displaced fracture of distal phalanx of left great toe, subsequent encounter for fracture with routine healing: Secondary | ICD-10-CM | POA: Diagnosis not present

## 2020-11-08 DIAGNOSIS — J441 Chronic obstructive pulmonary disease with (acute) exacerbation: Secondary | ICD-10-CM | POA: Diagnosis not present

## 2020-11-08 DIAGNOSIS — E7800 Pure hypercholesterolemia, unspecified: Secondary | ICD-10-CM | POA: Diagnosis not present

## 2020-11-08 DIAGNOSIS — J449 Chronic obstructive pulmonary disease, unspecified: Secondary | ICD-10-CM | POA: Diagnosis not present

## 2020-11-08 DIAGNOSIS — J45909 Unspecified asthma, uncomplicated: Secondary | ICD-10-CM | POA: Diagnosis not present

## 2020-11-08 DIAGNOSIS — I10 Essential (primary) hypertension: Secondary | ICD-10-CM | POA: Diagnosis not present

## 2020-11-08 DIAGNOSIS — I2510 Atherosclerotic heart disease of native coronary artery without angina pectoris: Secondary | ICD-10-CM | POA: Diagnosis not present

## 2020-11-12 DIAGNOSIS — M79672 Pain in left foot: Secondary | ICD-10-CM | POA: Diagnosis not present

## 2020-11-12 DIAGNOSIS — S92422D Displaced fracture of distal phalanx of left great toe, subsequent encounter for fracture with routine healing: Secondary | ICD-10-CM | POA: Diagnosis not present

## 2020-12-10 DIAGNOSIS — M25562 Pain in left knee: Secondary | ICD-10-CM | POA: Diagnosis not present

## 2020-12-10 DIAGNOSIS — S92422D Displaced fracture of distal phalanx of left great toe, subsequent encounter for fracture with routine healing: Secondary | ICD-10-CM | POA: Diagnosis not present

## 2021-01-15 DIAGNOSIS — G44309 Post-traumatic headache, unspecified, not intractable: Secondary | ICD-10-CM | POA: Diagnosis not present

## 2021-01-15 DIAGNOSIS — E7800 Pure hypercholesterolemia, unspecified: Secondary | ICD-10-CM | POA: Diagnosis not present

## 2021-01-15 DIAGNOSIS — J441 Chronic obstructive pulmonary disease with (acute) exacerbation: Secondary | ICD-10-CM | POA: Diagnosis not present

## 2021-01-15 DIAGNOSIS — I10 Essential (primary) hypertension: Secondary | ICD-10-CM | POA: Diagnosis not present

## 2021-01-15 DIAGNOSIS — I2510 Atherosclerotic heart disease of native coronary artery without angina pectoris: Secondary | ICD-10-CM | POA: Diagnosis not present

## 2021-01-15 DIAGNOSIS — J449 Chronic obstructive pulmonary disease, unspecified: Secondary | ICD-10-CM | POA: Diagnosis not present

## 2021-01-15 DIAGNOSIS — E785 Hyperlipidemia, unspecified: Secondary | ICD-10-CM | POA: Diagnosis not present

## 2021-01-15 DIAGNOSIS — J45909 Unspecified asthma, uncomplicated: Secondary | ICD-10-CM | POA: Diagnosis not present

## 2021-01-15 DIAGNOSIS — R4189 Other symptoms and signs involving cognitive functions and awareness: Secondary | ICD-10-CM | POA: Diagnosis not present

## 2021-01-17 ENCOUNTER — Other Ambulatory Visit: Payer: Self-pay | Admitting: Family Medicine

## 2021-01-17 DIAGNOSIS — G44309 Post-traumatic headache, unspecified, not intractable: Secondary | ICD-10-CM

## 2021-02-03 ENCOUNTER — Ambulatory Visit
Admission: RE | Admit: 2021-02-03 | Discharge: 2021-02-03 | Disposition: A | Discharge status: Home / Self Care | Payer: Medicare Other | Source: Ambulatory Visit | Attending: Family Medicine | Admitting: Family Medicine

## 2021-02-03 DIAGNOSIS — G44309 Post-traumatic headache, unspecified, not intractable: Secondary | ICD-10-CM

## 2021-02-03 MED ORDER — GADOBENATE DIMEGLUMINE 529 MG/ML IV SOLN
20.0000 mL | Freq: Once | INTRAVENOUS | Status: AC | PRN
  Administered 2021-02-03: 20 mL via INTRAVENOUS

## 2021-02-27 DIAGNOSIS — K2100 Gastro-esophageal reflux disease with esophagitis, without bleeding: Secondary | ICD-10-CM | POA: Diagnosis not present

## 2021-02-27 DIAGNOSIS — R3915 Urgency of urination: Secondary | ICD-10-CM | POA: Diagnosis not present

## 2021-02-27 DIAGNOSIS — J45909 Unspecified asthma, uncomplicated: Secondary | ICD-10-CM | POA: Diagnosis not present

## 2021-02-27 DIAGNOSIS — I10 Essential (primary) hypertension: Secondary | ICD-10-CM | POA: Diagnosis not present

## 2021-02-27 DIAGNOSIS — I700 Atherosclerosis of aorta: Secondary | ICD-10-CM | POA: Diagnosis not present

## 2021-02-27 DIAGNOSIS — Z1389 Encounter for screening for other disorder: Secondary | ICD-10-CM | POA: Diagnosis not present

## 2021-02-27 DIAGNOSIS — N3941 Urge incontinence: Secondary | ICD-10-CM | POA: Diagnosis not present

## 2021-02-27 DIAGNOSIS — B0229 Other postherpetic nervous system involvement: Secondary | ICD-10-CM | POA: Diagnosis not present

## 2021-02-27 DIAGNOSIS — E7800 Pure hypercholesterolemia, unspecified: Secondary | ICD-10-CM | POA: Diagnosis not present

## 2021-02-27 DIAGNOSIS — R39198 Other difficulties with micturition: Secondary | ICD-10-CM | POA: Diagnosis not present

## 2021-02-27 DIAGNOSIS — Z0000 Encounter for general adult medical examination without abnormal findings: Secondary | ICD-10-CM | POA: Diagnosis not present

## 2021-02-27 DIAGNOSIS — E663 Overweight: Secondary | ICD-10-CM | POA: Diagnosis not present

## 2021-02-27 DIAGNOSIS — R454 Irritability and anger: Secondary | ICD-10-CM | POA: Diagnosis not present

## 2021-02-27 DIAGNOSIS — Z23 Encounter for immunization: Secondary | ICD-10-CM | POA: Diagnosis not present

## 2021-04-04 ENCOUNTER — Emergency Department (HOSPITAL_COMMUNITY): Payer: Medicare Other

## 2021-04-04 ENCOUNTER — Encounter (HOSPITAL_COMMUNITY): Payer: Self-pay

## 2021-04-04 ENCOUNTER — Emergency Department (HOSPITAL_COMMUNITY)
Admission: EM | Admit: 2021-04-04 | Discharge: 2021-04-05 | Disposition: A | Discharge status: Home / Self Care | Payer: Medicare Other | Attending: Emergency Medicine | Admitting: Emergency Medicine

## 2021-04-04 DIAGNOSIS — J45909 Unspecified asthma, uncomplicated: Secondary | ICD-10-CM | POA: Diagnosis not present

## 2021-04-04 DIAGNOSIS — Z91040 Latex allergy status: Secondary | ICD-10-CM | POA: Diagnosis not present

## 2021-04-04 DIAGNOSIS — Z79899 Other long term (current) drug therapy: Secondary | ICD-10-CM | POA: Diagnosis not present

## 2021-04-04 DIAGNOSIS — W010XXA Fall on same level from slipping, tripping and stumbling without subsequent striking against object, initial encounter: Secondary | ICD-10-CM | POA: Insufficient documentation

## 2021-04-04 DIAGNOSIS — Z7982 Long term (current) use of aspirin: Secondary | ICD-10-CM | POA: Diagnosis not present

## 2021-04-04 DIAGNOSIS — S52101A Unspecified fracture of upper end of right radius, initial encounter for closed fracture: Secondary | ICD-10-CM | POA: Diagnosis not present

## 2021-04-04 DIAGNOSIS — Z87891 Personal history of nicotine dependence: Secondary | ICD-10-CM | POA: Insufficient documentation

## 2021-04-04 DIAGNOSIS — J449 Chronic obstructive pulmonary disease, unspecified: Secondary | ICD-10-CM | POA: Diagnosis not present

## 2021-04-04 DIAGNOSIS — S6992XA Unspecified injury of left wrist, hand and finger(s), initial encounter: Secondary | ICD-10-CM | POA: Diagnosis present

## 2021-04-04 DIAGNOSIS — S52181A Other fracture of upper end of right radius, initial encounter for closed fracture: Secondary | ICD-10-CM | POA: Diagnosis not present

## 2021-04-04 DIAGNOSIS — I10 Essential (primary) hypertension: Secondary | ICD-10-CM | POA: Insufficient documentation

## 2021-04-04 DIAGNOSIS — Z7951 Long term (current) use of inhaled steroids: Secondary | ICD-10-CM | POA: Insufficient documentation

## 2021-04-04 NOTE — ED Triage Notes (Signed)
Patient arrives from home with complaint of fall that occurred outside x 1 hour ago. Pt tripped and fell onto his right wrist. Pt reporting right wrist pain, did not hit head, no LOC. No blood thinners.

## 2021-04-04 NOTE — ED Provider Notes (Signed)
[facility] DEPT Provider Note   CSN: [id number] Arrival date & time: 04/04/21  2321     History Chief Complaint  Patient presents with   Ralph [name] is a 70 y.o. male.  The history is provided by the patient and medical records.  Fall    70 y.o. M with hx of arthritis, asthma, GERD, hyperlipidemia, presenting to the ED with right wrist pain.  States he was walking outside, lost his footing and fell, tried to break his fall with right wrist.  There was no head injury or loss of consciousness.  States ongoing pain along ulnar aspect of right wrist.  He denies any numbness or tingling.  He is right-hand dominant.  He has applied ice prior to arrival.  Past Medical History:  Diagnosis Date   Arthritis    hands   Asthma    GERD (gastroesophageal reflux disease)    History of kidney stones    x2  -episodes   Hyperlipidemia    Renal disorder    Varicose veins    bilateral surgeries- no problems now    Patient Active Problem List   Diagnosis Date Noted   Abnormal CT of the chest 09/14/2016   COPD GOLD III  09/12/2016   DOE (dyspnea on exertion) 01/18/2016   Excessive daytime sleepiness 01/18/2016   Essential hypertension 01/18/2016    Past Surgical History:  Procedure Laterality Date   COLONOSCOPY WITH PROPOFOL N/A 03/20/2015   Procedure: COLONOSCOPY WITH PROPOFOL;  Surgeon: Garlan Fair, MD;  Location: [facility];  Service: Endoscopy;  Laterality: N/A;   HERNIA REPAIR     inguinal hernia   TONSILLECTOMY     VARICOSE VEIN SURGERY Bilateral    2 years ago   VASECTOMY         Family History  Problem Relation Age of Onset   Dementia Mother    COPD Father     Social History   Tobacco Use   Smoking status: Former    Packs/day: 0.50    Years: 20.00    Pack years: 10.00    Types: Cigarettes    Quit date: 04/21/2013    Years since quitting: 7.9   Smokeless tobacco: Never  Vaping Use   Vaping Use: Never used   Substance Use Topics   Alcohol use: Yes    Comment: social   Drug use: No    Home Medications Prior to Admission medications   Medication Sig Start Date End Date Taking? Authorizing Provider  albuterol (PROAIR HFA) 108 (90 Base) MCG/ACT inhaler Inhale 2 puffs into the lungs every 6 (six) hours as needed for wheezing or shortness of breath.    [provider]  aspirin EC 81 MG tablet Take 1 tablet (81 mg total) by mouth daily. 02/20/16   Sueanne Margarita, MD  budesonide-formoterol (SYMBICORT) 160-4.5 MCG/ACT inhaler Inhale 2 puffs into the lungs 2 (two) times daily.    [provider]  clonazePAM (KLONOPIN) 0.5 MG tablet Take 0.5 mg by mouth at bedtime.    [provider]  escitalopram (LEXAPRO) 20 MG tablet Take 20 mg by mouth daily.    [provider]  HYDROcodone-acetaminophen (NORCO/VICODIN) 5-325 MG tablet Take 2 tablets by mouth every 4 (four) hours as needed. 10/31/20   Sponseller, Gypsy Balsam, PA-C  ibuprofen (ADVIL,MOTRIN) 200 MG tablet Take 400 mg by mouth every 6 (six) hours as needed for pain or headache.    [provider]  isosorbide mononitrate (IMDUR) 30 MG 24 hr tablet Take 0.5 tablets (15 mg total) by mouth daily. 03/20/16 03/15/17  Sueanne Margarita, MD  montelukast (SINGULAIR) 10 MG tablet Take 10 mg by mouth daily. 12/05/15   [provider]  omeprazole (PRILOSEC) 20 MG capsule Take 20 mg by mouth daily.    [provider]  rosuvastatin (CRESTOR) 10 MG tablet Take 1 tablet (10 mg total) by mouth daily. 03/20/16 10/17/16  Sueanne Margarita, MD  tamsulosin (FLOMAX) 0.4 MG CAPS capsule Take 0.4 mg by mouth at bedtime.    [provider]  Tiotropium Bromide Monohydrate (SPIRIVA RESPIMAT) 2.5 MCG/ACT AERS Inhale 2 puffs into the lungs daily. 11/04/16   Tanda Rockers, MD    Allergies    Latex and Morphine and related  Review of Systems   Review of Systems  Musculoskeletal:  Positive for arthralgias.  All other  systems reviewed and are negative.  Physical Exam Updated Vital Signs BP (!) 141/71    Pulse 85    Temp 98 F (36.7 C) (Oral)    Resp 19    Ht 6' (1.829 m)    Wt 94.3 kg    SpO2 100%    BMI 28.21 kg/m   Physical Exam Vitals and nursing note reviewed.  Constitutional:      Appearance: He is well-developed.  HENT:     Head: Normocephalic and atraumatic.  Eyes:     Conjunctiva/sclera: Conjunctivae normal.     Pupils: Pupils are equal, round, and reactive to light.  Cardiovascular:     Rate and Rhythm: Normal rate and regular rhythm.     Heart sounds: Normal heart sounds.  Pulmonary:     Effort: Pulmonary effort is normal. No respiratory distress.     Breath sounds: Normal breath sounds. No rhonchi.  Abdominal:     General: Bowel sounds are normal.     Palpations: Abdomen is soft.     Tenderness: There is no abdominal tenderness. There is no rebound.  Musculoskeletal:        General: Normal range of motion.     Cervical back: Normal range of motion.     Comments: Mild tenderness noted to ulnar aspect of right wrist, normal ROM, radial pulse intact, normal cap refill and sensation to all fingers  Skin:    General: Skin is warm and dry.  Neurological:     Mental Status: He is alert and oriented to person, place, and time.    ED Results / Procedures / Treatments   Labs (all labs ordered are listed, but only abnormal results are displayed) Labs Reviewed - No data to display  EKG None  Radiology DG Wrist Complete Right  Result Date: 04/05/2021 CLINICAL DATA:  Right wrist pain, fall EXAM: RIGHT WRIST - COMPLETE 3+ VIEW COMPARISON:  None. FINDINGS: Transverse distal radial fracture, without definite intra-articular extension. Given sclerosis, this appearance may be subacute. The joint spaces are preserved. Visualized soft tissues are within normal limits. IMPRESSION: Transverse distal radial fracture, possibly subacute. Electronically Signed   By: Julian Hy M.D.   On:  04/05/2021 00:00    Procedures Procedures   Medications Ordered in ED Medications - No data to display  ED Course  I have reviewed the triage vital signs and the nursing notes.  Pertinent labs & imaging results that were available during my care of the patient were reviewed by me and considered in my medical decision making (see chart for details).  MDM Rules/Calculators/A&P                          70 year old male presenting to the ED for right wrist pain.  Slipped and fell outside, attempted to break his fall with right wrist.  There is no head injury or loss of consciousness.  Ongoing pain to right wrist, more so along the ulnar aspect of the wrist.  There is no acute deformity and hand is neurovascular intact.  X-ray with transverse distal radial fracture, possibly subacute but given his fall tonight we will assume that this is acute.  Placed in splint and will refer to hand surgery for follow-up.  He is on chronic tramadol so we will have him continue this.  He may return here for new concerns.   Final Clinical Impression(s) / ED Diagnoses Final diagnoses:  Other closed fracture of proximal end of right radius, initial encounter    Rx / DC Orders ED Discharge Orders     None        Larene Pickett, PA-C 04/05/21 0116    Maudie Flakes, MD 04/05/21 [phone]

## 2021-04-05 DIAGNOSIS — S52101A Unspecified fracture of upper end of right radius, initial encounter for closed fracture: Secondary | ICD-10-CM | POA: Diagnosis not present

## 2021-04-05 NOTE — ED Notes (Addendum)
Shoulder sling applied before discharge by Sabino Donovan.

## 2021-04-05 NOTE — Progress Notes (Signed)
Orthopedic Tech Progress Note Patient Details:  Ralph [name] 1950-05-03 [id number]  Ortho Devices Type of Ortho Device: Sugartong splint, Shoulder immobilizer Ortho Device/Splint Location: RUE   Post Interventions Patient Tolerated: Well Instructions Provided: Adjustment of device, Poper ambulation with device, Care of device  [name] 04/05/2021, 1:56 AM

## 2021-04-05 NOTE — Discharge Instructions (Signed)
Continue your tramadol at home.  Can take tylenol or motrin with this if needed. Follow-up with Dr. Caralyn Guile-- call his office to arrange follow-up appt. Return to the ED for new or worsening symptoms.

## 2021-04-05 NOTE — ED Notes (Signed)
Ortho called for splint  

## 2021-04-08 DIAGNOSIS — M25531 Pain in right wrist: Secondary | ICD-10-CM | POA: Diagnosis not present

## 2021-04-08 DIAGNOSIS — S52501A Unspecified fracture of the lower end of right radius, initial encounter for closed fracture: Secondary | ICD-10-CM | POA: Diagnosis not present

## 2021-04-25 DIAGNOSIS — S52551D Other extraarticular fracture of lower end of right radius, subsequent encounter for closed fracture with routine healing: Secondary | ICD-10-CM | POA: Diagnosis not present

## 2021-05-28 DIAGNOSIS — S52551D Other extraarticular fracture of lower end of right radius, subsequent encounter for closed fracture with routine healing: Secondary | ICD-10-CM | POA: Diagnosis not present

## 2021-06-11 DIAGNOSIS — H2513 Age-related nuclear cataract, bilateral: Secondary | ICD-10-CM | POA: Diagnosis not present

## 2021-06-11 DIAGNOSIS — H43813 Vitreous degeneration, bilateral: Secondary | ICD-10-CM | POA: Diagnosis not present

## 2021-06-25 DIAGNOSIS — S52551D Other extraarticular fracture of lower end of right radius, subsequent encounter for closed fracture with routine healing: Secondary | ICD-10-CM | POA: Diagnosis not present

## 2021-08-05 IMAGING — CR DG FOOT COMPLETE 3+V*L*
3 series · 3 of 3 positions shown · non-contrast
Comparison: None

CLINICAL DATA: Pain and swelling. History of open fracture of the
left great toe.

EXAM:
LEFT FOOT - COMPLETE 3+ VIEW

[x foot ap left]
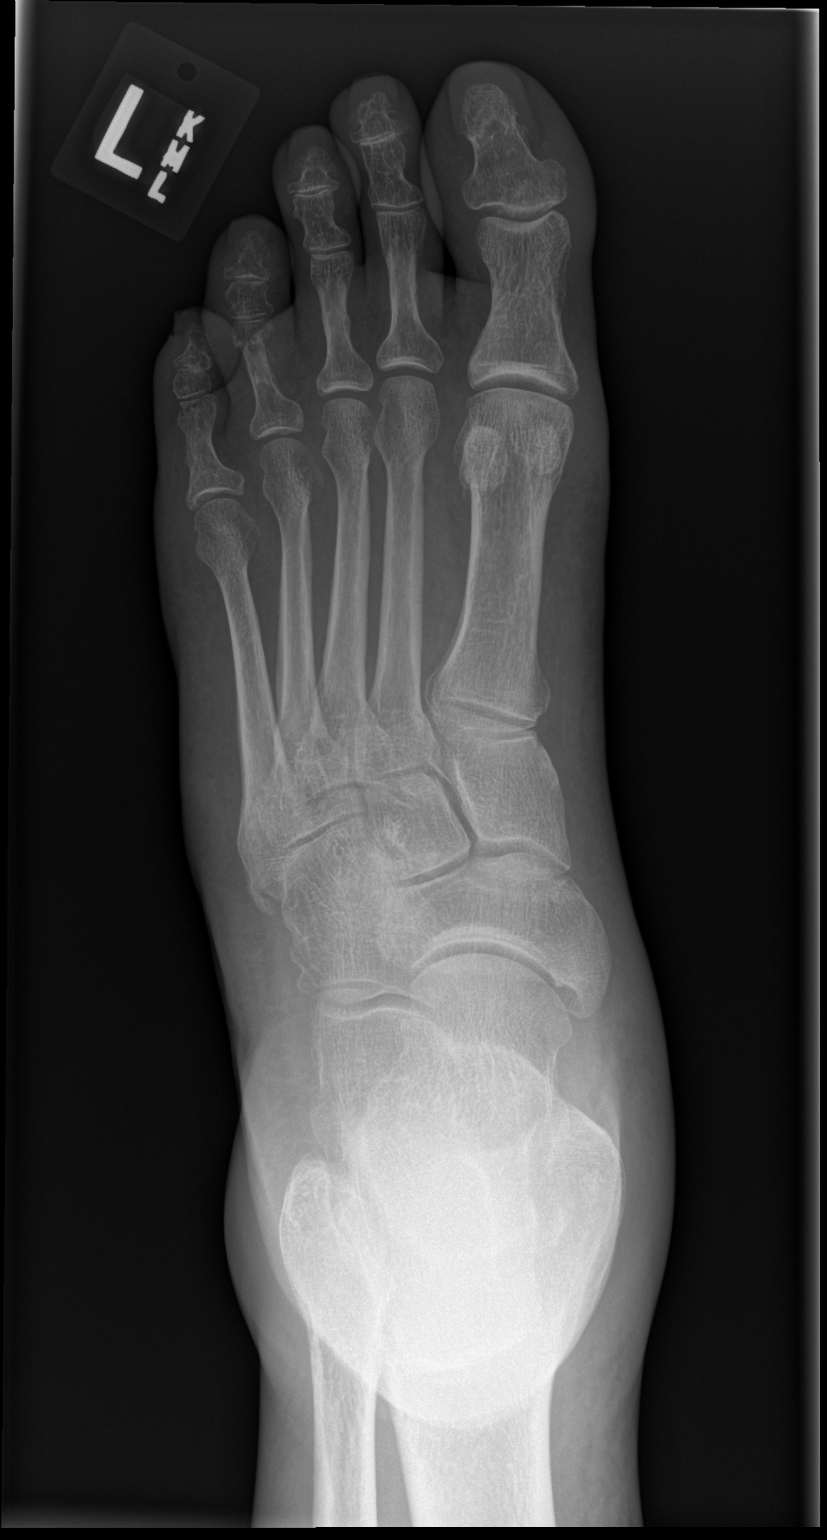

[x foot obl left]
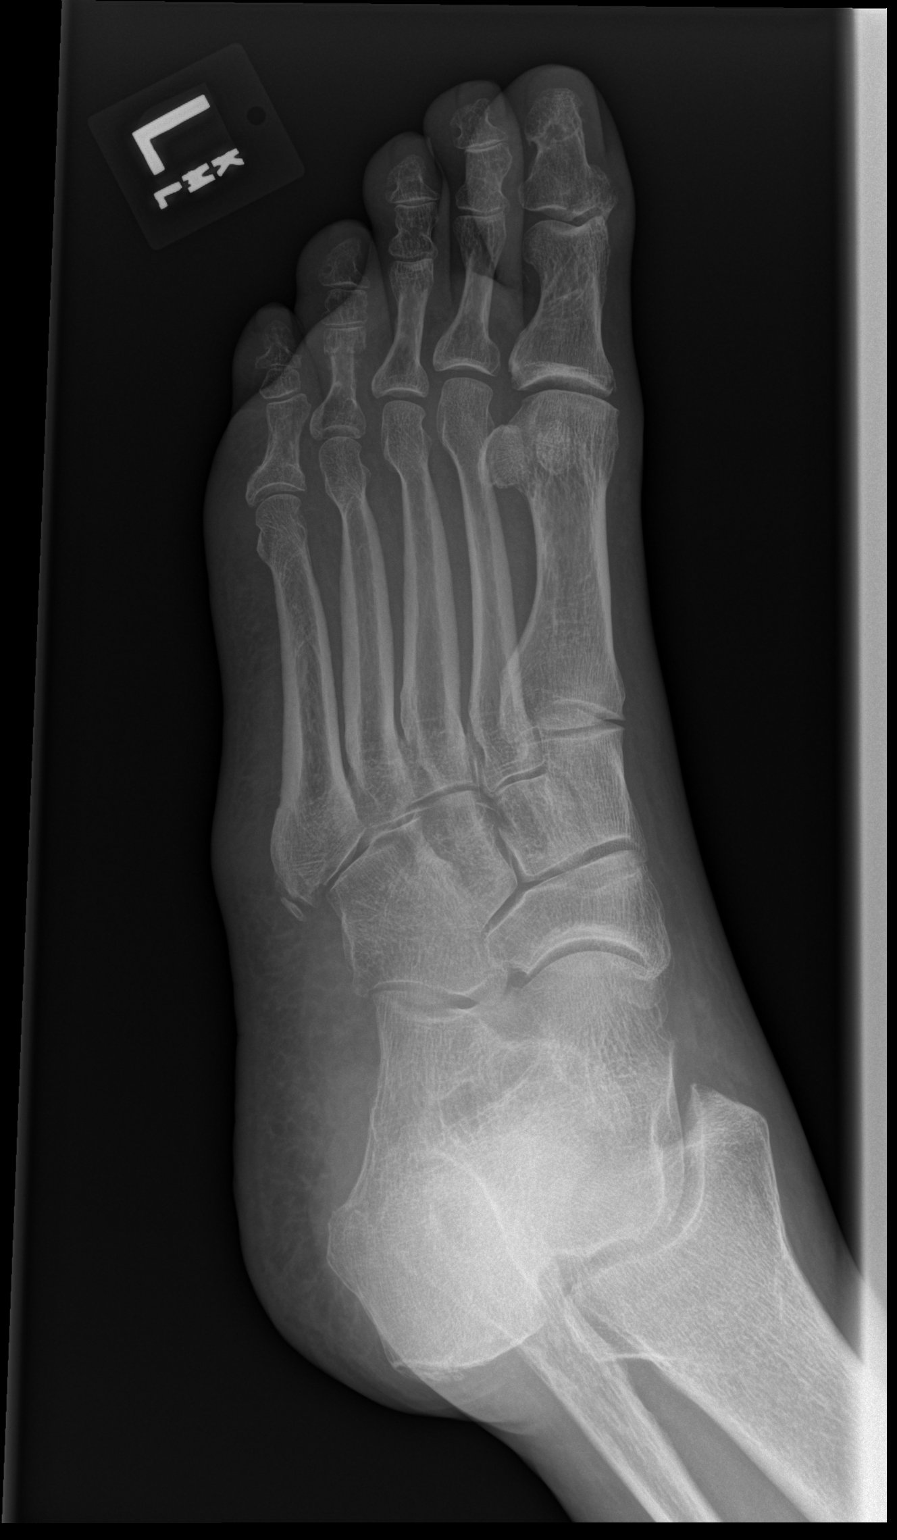

[x foot lat left]
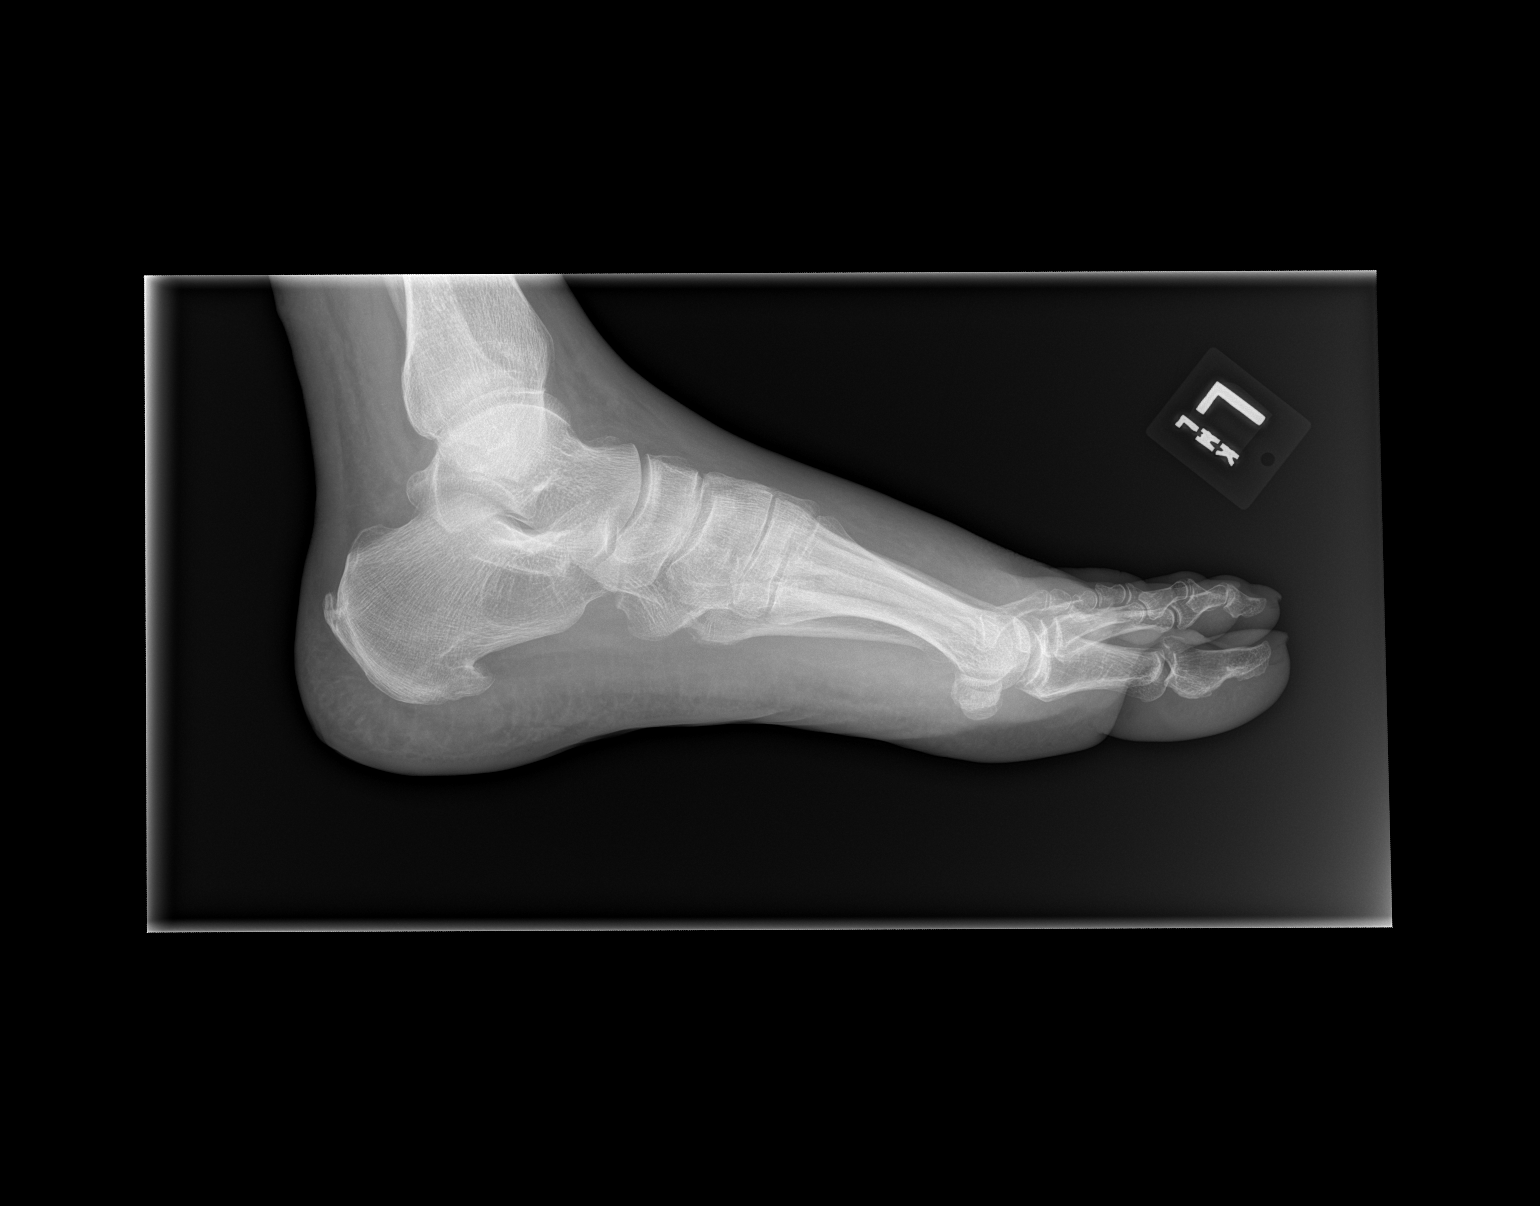

[3 of 3 positions shown; findings below may reference images not displayed]

FINDINGS: There is a dorsal plate avulsion type intra-articular fracture
involving the distal phalanx of the great toe.

Suspect a healing fracture involving the fourth proximal phalanx.

Suspect remote healed fracture or healed osteochondral lesion
involving the navicular bone.

Calcaneal spurring changes are noted.

Mild diffuse subcutaneous soft tissue swelling. No gas in the soft
tissues.
IMPRESSION: 1. Dorsal plate avulsion type fracture involving the distal phalanx
of the great toe.
2. Probable healing fracture involving the fourth proximal phalanx.
3. Remote healed changes of AVN of the navicular bone.

## 2021-08-26 DIAGNOSIS — E7800 Pure hypercholesterolemia, unspecified: Secondary | ICD-10-CM | POA: Diagnosis not present

## 2021-08-26 DIAGNOSIS — B0229 Other postherpetic nervous system involvement: Secondary | ICD-10-CM | POA: Diagnosis not present

## 2021-08-26 DIAGNOSIS — I10 Essential (primary) hypertension: Secondary | ICD-10-CM | POA: Diagnosis not present

## 2021-08-26 DIAGNOSIS — J449 Chronic obstructive pulmonary disease, unspecified: Secondary | ICD-10-CM | POA: Diagnosis not present

## 2021-09-09 DIAGNOSIS — S60552A Superficial foreign body of left hand, initial encounter: Secondary | ICD-10-CM | POA: Diagnosis not present

## 2021-09-18 DIAGNOSIS — I2510 Atherosclerotic heart disease of native coronary artery without angina pectoris: Secondary | ICD-10-CM | POA: Diagnosis not present

## 2021-09-18 DIAGNOSIS — I10 Essential (primary) hypertension: Secondary | ICD-10-CM | POA: Diagnosis not present

## 2021-09-18 DIAGNOSIS — E785 Hyperlipidemia, unspecified: Secondary | ICD-10-CM | POA: Diagnosis not present

## 2021-09-18 DIAGNOSIS — J449 Chronic obstructive pulmonary disease, unspecified: Secondary | ICD-10-CM | POA: Diagnosis not present

## 2021-11-04 DIAGNOSIS — J449 Chronic obstructive pulmonary disease, unspecified: Secondary | ICD-10-CM | POA: Diagnosis not present

## 2021-11-04 DIAGNOSIS — Z86010 Personal history of colonic polyps: Secondary | ICD-10-CM | POA: Diagnosis not present

## 2021-11-04 DIAGNOSIS — K2100 Gastro-esophageal reflux disease with esophagitis, without bleeding: Secondary | ICD-10-CM | POA: Diagnosis not present

## 2021-11-06 DIAGNOSIS — S61212A Laceration without foreign body of right middle finger without damage to nail, initial encounter: Secondary | ICD-10-CM | POA: Diagnosis not present

## 2021-11-14 DIAGNOSIS — D126 Benign neoplasm of colon, unspecified: Secondary | ICD-10-CM | POA: Diagnosis not present

## 2021-11-14 DIAGNOSIS — K648 Other hemorrhoids: Secondary | ICD-10-CM | POA: Diagnosis not present

## 2021-11-14 DIAGNOSIS — K5730 Diverticulosis of large intestine without perforation or abscess without bleeding: Secondary | ICD-10-CM | POA: Diagnosis not present

## 2021-11-14 DIAGNOSIS — D124 Benign neoplasm of descending colon: Secondary | ICD-10-CM | POA: Diagnosis not present

## 2021-11-14 DIAGNOSIS — Z09 Encounter for follow-up examination after completed treatment for conditions other than malignant neoplasm: Secondary | ICD-10-CM | POA: Diagnosis not present

## 2021-11-14 DIAGNOSIS — K635 Polyp of colon: Secondary | ICD-10-CM | POA: Diagnosis not present

## 2021-11-14 DIAGNOSIS — Z86010 Personal history of colonic polyps: Secondary | ICD-10-CM | POA: Diagnosis not present

## 2021-11-14 DIAGNOSIS — D123 Benign neoplasm of transverse colon: Secondary | ICD-10-CM | POA: Diagnosis not present

## 2021-11-18 DIAGNOSIS — D123 Benign neoplasm of transverse colon: Secondary | ICD-10-CM | POA: Diagnosis not present

## 2021-11-18 DIAGNOSIS — D124 Benign neoplasm of descending colon: Secondary | ICD-10-CM | POA: Diagnosis not present

## 2021-12-17 DIAGNOSIS — Z6828 Body mass index (BMI) 28.0-28.9, adult: Secondary | ICD-10-CM | POA: Diagnosis not present

## 2021-12-17 DIAGNOSIS — F418 Other specified anxiety disorders: Secondary | ICD-10-CM | POA: Diagnosis not present

## 2022-03-03 DIAGNOSIS — Z1389 Encounter for screening for other disorder: Secondary | ICD-10-CM | POA: Diagnosis not present

## 2022-03-03 DIAGNOSIS — Z0000 Encounter for general adult medical examination without abnormal findings: Secondary | ICD-10-CM | POA: Diagnosis not present

## 2022-03-03 DIAGNOSIS — Z6828 Body mass index (BMI) 28.0-28.9, adult: Secondary | ICD-10-CM | POA: Diagnosis not present

## 2022-03-19 DIAGNOSIS — R6883 Chills (without fever): Secondary | ICD-10-CM | POA: Diagnosis not present

## 2022-03-19 DIAGNOSIS — M545 Low back pain, unspecified: Secondary | ICD-10-CM | POA: Diagnosis not present

## 2022-03-19 DIAGNOSIS — R52 Pain, unspecified: Secondary | ICD-10-CM | POA: Diagnosis not present

## 2022-03-19 DIAGNOSIS — Z20822 Contact with and (suspected) exposure to covid-19: Secondary | ICD-10-CM | POA: Diagnosis not present

## 2022-04-07 DIAGNOSIS — I700 Atherosclerosis of aorta: Secondary | ICD-10-CM | POA: Diagnosis not present

## 2022-04-07 DIAGNOSIS — F418 Other specified anxiety disorders: Secondary | ICD-10-CM | POA: Diagnosis not present

## 2022-04-07 DIAGNOSIS — Z6827 Body mass index (BMI) 27.0-27.9, adult: Secondary | ICD-10-CM | POA: Diagnosis not present

## 2022-04-07 DIAGNOSIS — J449 Chronic obstructive pulmonary disease, unspecified: Secondary | ICD-10-CM | POA: Diagnosis not present

## 2022-04-07 DIAGNOSIS — I10 Essential (primary) hypertension: Secondary | ICD-10-CM | POA: Diagnosis not present

## 2022-07-16 DIAGNOSIS — H2513 Age-related nuclear cataract, bilateral: Secondary | ICD-10-CM | POA: Diagnosis not present

## 2022-07-16 DIAGNOSIS — H43813 Vitreous degeneration, bilateral: Secondary | ICD-10-CM | POA: Diagnosis not present

## 2022-07-16 DIAGNOSIS — H538 Other visual disturbances: Secondary | ICD-10-CM | POA: Diagnosis not present

## 2022-08-19 DIAGNOSIS — K080 Exfoliation of teeth due to systemic causes: Secondary | ICD-10-CM | POA: Diagnosis not present

## 2022-08-20 DIAGNOSIS — K080 Exfoliation of teeth due to systemic causes: Secondary | ICD-10-CM | POA: Diagnosis not present

## 2022-09-04 DIAGNOSIS — M25511 Pain in right shoulder: Secondary | ICD-10-CM | POA: Diagnosis not present

## 2022-09-04 DIAGNOSIS — G894 Chronic pain syndrome: Secondary | ICD-10-CM | POA: Diagnosis not present

## 2022-09-26 DIAGNOSIS — M25572 Pain in left ankle and joints of left foot: Secondary | ICD-10-CM | POA: Diagnosis not present

## 2022-09-26 DIAGNOSIS — M25531 Pain in right wrist: Secondary | ICD-10-CM | POA: Diagnosis not present

## 2022-09-26 DIAGNOSIS — M25532 Pain in left wrist: Secondary | ICD-10-CM | POA: Diagnosis not present

## 2022-09-26 DIAGNOSIS — M25571 Pain in right ankle and joints of right foot: Secondary | ICD-10-CM | POA: Diagnosis not present

## 2022-10-08 DIAGNOSIS — S81811A Laceration without foreign body, right lower leg, initial encounter: Secondary | ICD-10-CM | POA: Diagnosis not present

## 2022-10-13 DIAGNOSIS — J449 Chronic obstructive pulmonary disease, unspecified: Secondary | ICD-10-CM | POA: Diagnosis not present

## 2022-10-13 DIAGNOSIS — I700 Atherosclerosis of aorta: Secondary | ICD-10-CM | POA: Diagnosis not present

## 2022-10-13 DIAGNOSIS — D692 Other nonthrombocytopenic purpura: Secondary | ICD-10-CM | POA: Diagnosis not present

## 2022-10-13 DIAGNOSIS — G319 Degenerative disease of nervous system, unspecified: Secondary | ICD-10-CM | POA: Diagnosis not present

## 2022-10-13 DIAGNOSIS — D7589 Other specified diseases of blood and blood-forming organs: Secondary | ICD-10-CM | POA: Diagnosis not present

## 2022-10-13 DIAGNOSIS — R000 Tachycardia, unspecified: Secondary | ICD-10-CM | POA: Diagnosis not present

## 2022-11-03 DIAGNOSIS — Z0100 Encounter for examination of eyes and vision without abnormal findings: Secondary | ICD-10-CM | POA: Diagnosis not present

## 2023-03-03 DIAGNOSIS — K080 Exfoliation of teeth due to systemic causes: Secondary | ICD-10-CM | POA: Diagnosis not present

## 2023-03-05 DIAGNOSIS — Z0000 Encounter for general adult medical examination without abnormal findings: Secondary | ICD-10-CM | POA: Diagnosis not present

## 2023-03-05 DIAGNOSIS — Z1331 Encounter for screening for depression: Secondary | ICD-10-CM | POA: Diagnosis not present

## 2023-03-05 DIAGNOSIS — Z23 Encounter for immunization: Secondary | ICD-10-CM | POA: Diagnosis not present

## 2023-04-08 DIAGNOSIS — I700 Atherosclerosis of aorta: Secondary | ICD-10-CM | POA: Diagnosis not present

## 2023-04-08 DIAGNOSIS — G319 Degenerative disease of nervous system, unspecified: Secondary | ICD-10-CM | POA: Diagnosis not present

## 2023-04-08 DIAGNOSIS — E7800 Pure hypercholesterolemia, unspecified: Secondary | ICD-10-CM | POA: Diagnosis not present

## 2023-04-08 DIAGNOSIS — I10 Essential (primary) hypertension: Secondary | ICD-10-CM | POA: Diagnosis not present

## 2023-04-08 DIAGNOSIS — Z125 Encounter for screening for malignant neoplasm of prostate: Secondary | ICD-10-CM | POA: Diagnosis not present

## 2023-04-08 DIAGNOSIS — J449 Chronic obstructive pulmonary disease, unspecified: Secondary | ICD-10-CM | POA: Diagnosis not present

## 2023-05-25 ENCOUNTER — Encounter (HOSPITAL_COMMUNITY): Payer: BLUE CROSS/BLUE SHIELD

## 2023-05-25 ENCOUNTER — Inpatient Hospital Stay (HOSPITAL_COMMUNITY): Payer: Medicare Other

## 2023-05-25 ENCOUNTER — Inpatient Hospital Stay (HOSPITAL_COMMUNITY)
Admission: EM | Admit: 2023-05-25 | Discharge: 2023-06-09 | DRG: 208 | Disposition: A | Discharge status: Home Health | Payer: Medicare Other | Attending: Family Medicine | Admitting: Family Medicine

## 2023-05-25 ENCOUNTER — Emergency Department (HOSPITAL_COMMUNITY): Payer: Medicare Other

## 2023-05-25 DIAGNOSIS — M6281 Muscle weakness (generalized): Secondary | ICD-10-CM | POA: Diagnosis present

## 2023-05-25 DIAGNOSIS — Y906 Blood alcohol level of 120-199 mg/100 ml: Secondary | ICD-10-CM | POA: Diagnosis present

## 2023-05-25 DIAGNOSIS — K219 Gastro-esophageal reflux disease without esophagitis: Secondary | ICD-10-CM | POA: Diagnosis present

## 2023-05-25 DIAGNOSIS — R0603 Acute respiratory distress: Secondary | ICD-10-CM

## 2023-05-25 DIAGNOSIS — Z87891 Personal history of nicotine dependence: Secondary | ICD-10-CM | POA: Diagnosis not present

## 2023-05-25 DIAGNOSIS — F10939 Alcohol use, unspecified with withdrawal, unspecified: Secondary | ICD-10-CM | POA: Diagnosis not present

## 2023-05-25 DIAGNOSIS — J9811 Atelectasis: Secondary | ICD-10-CM | POA: Diagnosis present

## 2023-05-25 DIAGNOSIS — R0989 Other specified symptoms and signs involving the circulatory and respiratory systems: Secondary | ICD-10-CM | POA: Diagnosis not present

## 2023-05-25 DIAGNOSIS — M19072 Primary osteoarthritis, left ankle and foot: Secondary | ICD-10-CM | POA: Diagnosis not present

## 2023-05-25 DIAGNOSIS — J9600 Acute respiratory failure, unspecified whether with hypoxia or hypercapnia: Secondary | ICD-10-CM | POA: Diagnosis not present

## 2023-05-25 DIAGNOSIS — R55 Syncope and collapse: Secondary | ICD-10-CM | POA: Diagnosis not present

## 2023-05-25 DIAGNOSIS — F05 Delirium due to known physiological condition: Secondary | ICD-10-CM | POA: Diagnosis not present

## 2023-05-25 DIAGNOSIS — M79672 Pain in left foot: Secondary | ICD-10-CM | POA: Diagnosis present

## 2023-05-25 DIAGNOSIS — J9601 Acute respiratory failure with hypoxia: Principal | ICD-10-CM | POA: Diagnosis present

## 2023-05-25 DIAGNOSIS — N179 Acute kidney failure, unspecified: Secondary | ICD-10-CM | POA: Diagnosis present

## 2023-05-25 DIAGNOSIS — F1090 Alcohol use, unspecified, uncomplicated: Secondary | ICD-10-CM

## 2023-05-25 DIAGNOSIS — F10229 Alcohol dependence with intoxication, unspecified: Secondary | ICD-10-CM | POA: Diagnosis present

## 2023-05-25 DIAGNOSIS — R4189 Other symptoms and signs involving cognitive functions and awareness: Principal | ICD-10-CM

## 2023-05-25 DIAGNOSIS — Z91040 Latex allergy status: Secondary | ICD-10-CM

## 2023-05-25 DIAGNOSIS — I959 Hypotension, unspecified: Secondary | ICD-10-CM | POA: Diagnosis not present

## 2023-05-25 DIAGNOSIS — Z818 Family history of other mental and behavioral disorders: Secondary | ICD-10-CM

## 2023-05-25 DIAGNOSIS — S92415D Nondisplaced fracture of proximal phalanx of left great toe, subsequent encounter for fracture with routine healing: Secondary | ICD-10-CM | POA: Diagnosis not present

## 2023-05-25 DIAGNOSIS — Z885 Allergy status to narcotic agent status: Secondary | ICD-10-CM

## 2023-05-25 DIAGNOSIS — M47812 Spondylosis without myelopathy or radiculopathy, cervical region: Secondary | ICD-10-CM | POA: Diagnosis not present

## 2023-05-25 DIAGNOSIS — R001 Bradycardia, unspecified: Secondary | ICD-10-CM | POA: Diagnosis not present

## 2023-05-25 DIAGNOSIS — F10930 Alcohol use, unspecified with withdrawal, uncomplicated: Secondary | ICD-10-CM | POA: Diagnosis not present

## 2023-05-25 DIAGNOSIS — M4802 Spinal stenosis, cervical region: Secondary | ICD-10-CM | POA: Diagnosis not present

## 2023-05-25 DIAGNOSIS — Z4682 Encounter for fitting and adjustment of non-vascular catheter: Secondary | ICD-10-CM | POA: Diagnosis not present

## 2023-05-25 DIAGNOSIS — J45909 Unspecified asthma, uncomplicated: Secondary | ICD-10-CM | POA: Diagnosis present

## 2023-05-25 DIAGNOSIS — Z7982 Long term (current) use of aspirin: Secondary | ICD-10-CM

## 2023-05-25 DIAGNOSIS — R9431 Abnormal electrocardiogram [ECG] [EKG]: Secondary | ICD-10-CM | POA: Diagnosis not present

## 2023-05-25 DIAGNOSIS — F419 Anxiety disorder, unspecified: Secondary | ICD-10-CM | POA: Diagnosis present

## 2023-05-25 DIAGNOSIS — Z7951 Long term (current) use of inhaled steroids: Secondary | ICD-10-CM | POA: Diagnosis not present

## 2023-05-25 DIAGNOSIS — R4182 Altered mental status, unspecified: Secondary | ICD-10-CM | POA: Diagnosis not present

## 2023-05-25 DIAGNOSIS — S92521A Displaced fracture of medial phalanx of right lesser toe(s), initial encounter for closed fracture: Secondary | ICD-10-CM | POA: Diagnosis not present

## 2023-05-25 DIAGNOSIS — F10231 Alcohol dependence with withdrawal delirium: Secondary | ICD-10-CM | POA: Diagnosis not present

## 2023-05-25 DIAGNOSIS — W19XXXA Unspecified fall, initial encounter: Secondary | ICD-10-CM | POA: Diagnosis present

## 2023-05-25 DIAGNOSIS — Z87442 Personal history of urinary calculi: Secondary | ICD-10-CM

## 2023-05-25 DIAGNOSIS — I10 Essential (primary) hypertension: Secondary | ICD-10-CM | POA: Diagnosis not present

## 2023-05-25 DIAGNOSIS — Z6829 Body mass index (BMI) 29.0-29.9, adult: Secondary | ICD-10-CM

## 2023-05-25 DIAGNOSIS — R4020 Unspecified coma: Secondary | ICD-10-CM | POA: Diagnosis not present

## 2023-05-25 DIAGNOSIS — E440 Moderate protein-calorie malnutrition: Secondary | ICD-10-CM | POA: Diagnosis not present

## 2023-05-25 DIAGNOSIS — R5381 Other malaise: Secondary | ICD-10-CM | POA: Diagnosis present

## 2023-05-25 DIAGNOSIS — M7731 Calcaneal spur, right foot: Secondary | ICD-10-CM | POA: Diagnosis not present

## 2023-05-25 DIAGNOSIS — E785 Hyperlipidemia, unspecified: Secondary | ICD-10-CM | POA: Diagnosis not present

## 2023-05-25 DIAGNOSIS — M5030 Other cervical disc degeneration, unspecified cervical region: Secondary | ICD-10-CM | POA: Diagnosis not present

## 2023-05-25 DIAGNOSIS — Y92009 Unspecified place in unspecified non-institutional (private) residence as the place of occurrence of the external cause: Secondary | ICD-10-CM

## 2023-05-25 DIAGNOSIS — Z825 Family history of asthma and other chronic lower respiratory diseases: Secondary | ICD-10-CM | POA: Diagnosis not present

## 2023-05-25 DIAGNOSIS — R29818 Other symptoms and signs involving the nervous system: Secondary | ICD-10-CM | POA: Diagnosis not present

## 2023-05-25 DIAGNOSIS — L89891 Pressure ulcer of other site, stage 1: Secondary | ICD-10-CM | POA: Diagnosis present

## 2023-05-25 DIAGNOSIS — Z781 Physical restraint status: Secondary | ICD-10-CM | POA: Diagnosis not present

## 2023-05-25 DIAGNOSIS — Z79899 Other long term (current) drug therapy: Secondary | ICD-10-CM

## 2023-05-25 DIAGNOSIS — G8929 Other chronic pain: Secondary | ICD-10-CM | POA: Diagnosis present

## 2023-05-25 DIAGNOSIS — R569 Unspecified convulsions: Secondary | ICD-10-CM | POA: Diagnosis not present

## 2023-05-25 DIAGNOSIS — R404 Transient alteration of awareness: Secondary | ICD-10-CM | POA: Diagnosis not present

## 2023-05-25 DIAGNOSIS — R092 Respiratory arrest: Secondary | ICD-10-CM | POA: Diagnosis not present

## 2023-05-25 DIAGNOSIS — S92512D Displaced fracture of proximal phalanx of left lesser toe(s), subsequent encounter for fracture with routine healing: Secondary | ICD-10-CM | POA: Diagnosis not present

## 2023-05-25 DIAGNOSIS — R918 Other nonspecific abnormal finding of lung field: Secondary | ICD-10-CM | POA: Diagnosis not present

## 2023-05-25 DIAGNOSIS — S199XXA Unspecified injury of neck, initial encounter: Secondary | ICD-10-CM | POA: Diagnosis not present

## 2023-05-25 DIAGNOSIS — M7732 Calcaneal spur, left foot: Secondary | ICD-10-CM | POA: Diagnosis not present

## 2023-05-25 DIAGNOSIS — R9089 Other abnormal findings on diagnostic imaging of central nervous system: Secondary | ICD-10-CM | POA: Diagnosis not present

## 2023-05-25 LAB — RAPID URINE DRUG SCREEN, HOSP PERFORMED
Amphetamines: NOT DETECTED
Barbiturates: NOT DETECTED
Benzodiazepines: NOT DETECTED
Cocaine: NOT DETECTED
Opiates: NOT DETECTED
Tetrahydrocannabinol: NOT DETECTED

## 2023-05-25 LAB — URINALYSIS, ROUTINE W REFLEX MICROSCOPIC
Bilirubin Urine: NEGATIVE
Glucose, UA: NEGATIVE mg/dL
Hgb urine dipstick: NEGATIVE
Ketones, ur: NEGATIVE mg/dL
Leukocytes,Ua: NEGATIVE
Nitrite: NEGATIVE
Protein, ur: NEGATIVE mg/dL
Specific Gravity, Urine: 1.01 (ref 1.005–1.030)
pH: 6 (ref 5.0–8.0)

## 2023-05-25 LAB — I-STAT ARTERIAL BLOOD GAS, ED
Acid-base deficit: 3 mmol/L — ABNORMAL HIGH (ref 0.0–2.0)
Bicarbonate: 22.8 mmol/L (ref 20.0–28.0)
Calcium, Ion: 1.25 mmol/L (ref 1.15–1.40)
HCT: 40 % (ref 39.0–52.0)
Hemoglobin: 13.6 g/dL (ref 13.0–17.0)
O2 Saturation: 100 %
Patient temperature: 97.8
Potassium: 3.9 mmol/L (ref 3.5–5.1)
Sodium: 137 mmol/L (ref 135–145)
TCO2: 24 mmol/L (ref 22–32)
pCO2 arterial: 42.8 mm[Hg] (ref 32–48)
pH, Arterial: 7.332 — ABNORMAL LOW (ref 7.35–7.45)
pO2, Arterial: 339 mm[Hg] — ABNORMAL HIGH (ref 83–108)

## 2023-05-25 LAB — COMPREHENSIVE METABOLIC PANEL
ALT: 36 U/L (ref 0–44)
AST: 50 U/L — ABNORMAL HIGH (ref 15–41)
Albumin: 3.8 g/dL (ref 3.5–5.0)
Alkaline Phosphatase: 46 U/L (ref 38–126)
Anion gap: 14 (ref 5–15)
BUN: 17 mg/dL (ref 8–23)
CO2: 22 mmol/L (ref 22–32)
Calcium: 9.5 mg/dL (ref 8.9–10.3)
Chloride: 104 mmol/L (ref 98–111)
Creatinine, Ser: 1.46 mg/dL — ABNORMAL HIGH (ref 0.61–1.24)
GFR, Estimated: 51 mL/min — ABNORMAL LOW (ref >60)
Glucose, Bld: 99 mg/dL (ref 70–99)
Potassium: 4.7 mmol/L (ref 3.5–5.1)
Sodium: 140 mmol/L (ref 135–145)
Total Bilirubin: 1.1 mg/dL (ref 0.0–1.2)
Total Protein: 6.5 g/dL (ref 6.5–8.1)

## 2023-05-25 LAB — FOLATE: Folate: 13.5 ng/mL (ref >5.9)

## 2023-05-25 LAB — ECHOCARDIOGRAM COMPLETE
Height: 71 in
S' Lateral: 3 cm
Weight: 3421.54 [oz_av]

## 2023-05-25 LAB — I-STAT CHEM 8, ED
BUN: 21 mg/dL (ref 8–23)
Calcium, Ion: 1.15 mmol/L (ref 1.15–1.40)
Chloride: 105 mmol/L (ref 98–111)
Creatinine, Ser: 1.7 mg/dL — ABNORMAL HIGH (ref 0.61–1.24)
Glucose, Bld: 102 mg/dL — ABNORMAL HIGH (ref 70–99)
HCT: 43 % (ref 39.0–52.0)
Hemoglobin: 14.6 g/dL (ref 13.0–17.0)
Potassium: 4.6 mmol/L (ref 3.5–5.1)
Sodium: 140 mmol/L (ref 135–145)
TCO2: 26 mmol/L (ref 22–32)

## 2023-05-25 LAB — DIFFERENTIAL
Abs Immature Granulocytes: 0.05 10*3/uL (ref 0.00–0.07)
Basophils Absolute: 0.1 10*3/uL (ref 0.0–0.1)
Basophils Relative: 1 %
Eosinophils Absolute: 0.6 10*3/uL — ABNORMAL HIGH (ref 0.0–0.5)
Eosinophils Relative: 6 %
Immature Granulocytes: 1 %
Lymphocytes Relative: 28 %
Lymphs Abs: 2.7 10*3/uL (ref 0.7–4.0)
Monocytes Absolute: 0.8 10*3/uL (ref 0.1–1.0)
Monocytes Relative: 9 %
Neutro Abs: 5.5 10*3/uL (ref 1.7–7.7)
Neutrophils Relative %: 55 %

## 2023-05-25 LAB — PROTIME-INR
INR: 1.1 (ref 0.8–1.2)
Prothrombin Time: 14.4 s (ref 11.4–15.2)

## 2023-05-25 LAB — CBC
HCT: 46.1 % (ref 39.0–52.0)
HCT: 39.7 % (ref 39.0–52.0)
Hemoglobin: 15.4 g/dL (ref 13.0–17.0)
Hemoglobin: 13.3 g/dL (ref 13.0–17.0)
MCH: 36 pg — ABNORMAL HIGH (ref 26.0–34.0)
MCH: 36.5 pg — ABNORMAL HIGH (ref 26.0–34.0)
MCHC: 33.4 g/dL (ref 30.0–36.0)
MCHC: 33.5 g/dL (ref 30.0–36.0)
MCV: 107.7 fL — ABNORMAL HIGH (ref 80.0–100.0)
MCV: 109.1 fL — ABNORMAL HIGH (ref 80.0–100.0)
Platelets: 198 10*3/uL (ref 150–400)
Platelets: 176 10*3/uL (ref 150–400)
RBC: 4.28 MIL/uL (ref 4.22–5.81)
RBC: 3.64 MIL/uL — ABNORMAL LOW (ref 4.22–5.81)
RDW: 12.4 % (ref 11.5–15.5)
RDW: 12.5 % (ref 11.5–15.5)
WBC: 9.6 10*3/uL (ref 4.0–10.5)
WBC: 6.7 10*3/uL (ref 4.0–10.5)
nRBC: 0 % (ref 0.0–0.2)
nRBC: 0 % (ref 0.0–0.2)

## 2023-05-25 LAB — HEMOGLOBIN A1C
Hgb A1c MFr Bld: 5.2 % (ref 4.8–5.6)
Mean Plasma Glucose: 102.54 mg/dL

## 2023-05-25 LAB — VITAMIN B12: Vitamin B-12: 813 pg/mL (ref 180–914)

## 2023-05-25 LAB — GLUCOSE, CAPILLARY
Glucose-Capillary: 101 mg/dL — ABNORMAL HIGH (ref 70–99)
Glucose-Capillary: 106 mg/dL — ABNORMAL HIGH (ref 70–99)
Glucose-Capillary: 108 mg/dL — ABNORMAL HIGH (ref 70–99)
Glucose-Capillary: 122 mg/dL — ABNORMAL HIGH (ref 70–99)
Glucose-Capillary: 135 mg/dL — ABNORMAL HIGH (ref 70–99)

## 2023-05-25 LAB — BASIC METABOLIC PANEL
Anion gap: 15 (ref 5–15)
BUN: 16 mg/dL (ref 8–23)
CO2: 21 mmol/L — ABNORMAL LOW (ref 22–32)
Calcium: 8.8 mg/dL — ABNORMAL LOW (ref 8.9–10.3)
Chloride: 104 mmol/L (ref 98–111)
Creatinine, Ser: 1.34 mg/dL — ABNORMAL HIGH (ref 0.61–1.24)
GFR, Estimated: 56 mL/min — ABNORMAL LOW (ref >60)
Glucose, Bld: 110 mg/dL — ABNORMAL HIGH (ref 70–99)
Potassium: 3.3 mmol/L — ABNORMAL LOW (ref 3.5–5.1)
Sodium: 140 mmol/L (ref 135–145)

## 2023-05-25 LAB — CBG MONITORING, ED
Glucose-Capillary: 114 mg/dL — ABNORMAL HIGH (ref 70–99)
Glucose-Capillary: 108 mg/dL — ABNORMAL HIGH (ref 70–99)

## 2023-05-25 LAB — MRSA NEXT GEN BY PCR, NASAL: MRSA by PCR Next Gen: NOT DETECTED

## 2023-05-25 LAB — ETHANOL: Alcohol, Ethyl (B): 169 mg/dL — ABNORMAL HIGH (ref <10)

## 2023-05-25 LAB — APTT: aPTT: 27 s (ref 24–36)

## 2023-05-25 LAB — MAGNESIUM: Magnesium: 1.8 mg/dL (ref 1.7–2.4)

## 2023-05-25 LAB — T4, FREE: Free T4: 0.71 ng/dL (ref 0.61–1.12)

## 2023-05-25 LAB — AMMONIA: Ammonia: 56 umol/L — ABNORMAL HIGH (ref 9–35)

## 2023-05-25 LAB — HIV ANTIBODY (ROUTINE TESTING W REFLEX): HIV Screen 4th Generation wRfx: NONREACTIVE

## 2023-05-25 LAB — RPR: RPR Ser Ql: NONREACTIVE

## 2023-05-25 LAB — TSH: TSH: 2.699 u[IU]/mL (ref 0.350–4.500)

## 2023-05-25 LAB — BRAIN NATRIURETIC PEPTIDE: B Natriuretic Peptide: 48 pg/mL (ref 0.0–100.0)

## 2023-05-25 LAB — TROPONIN I (HIGH SENSITIVITY): Troponin I (High Sensitivity): 7 ng/L (ref <18)

## 2023-05-25 MED ORDER — DOCUSATE SODIUM 50 MG/5ML PO LIQD: 100.0000 mg | Freq: Two times a day (BID) | ORAL | Status: DC

## 2023-05-25 MED ORDER — DOXYCYCLINE HYCLATE 100 MG PO TABS
100.0000 mg | ORAL_TABLET | Freq: Two times a day (BID) | ORAL | Status: DC
  Administered 2023-05-25 – 2023-05-26 (×3): 100 mg via ORAL
  Filled 2023-05-25 (×3): qty 1

## 2023-05-25 MED ORDER — IOHEXOL 350 MG/ML SOLN
75.0000 mL | Freq: Once | INTRAVENOUS | Status: AC | PRN
  Administered 2023-05-25: 75 mL via INTRAVENOUS

## 2023-05-25 MED ORDER — INSULIN ASPART 100 UNIT/ML IJ SOLN
0.0000 [IU] | INTRAMUSCULAR | Status: DC
  Administered 2023-05-25 (×2): 1 [IU] via SUBCUTANEOUS

## 2023-05-25 MED ORDER — ALBUTEROL SULFATE (2.5 MG/3ML) 0.083% IN NEBU
5.0000 mg | INHALATION_SOLUTION | Freq: Once | RESPIRATORY_TRACT | Status: AC
  Administered 2023-05-25: 5 mg via RESPIRATORY_TRACT
  Filled 2023-05-25: qty 6

## 2023-05-25 MED ORDER — CHLORHEXIDINE GLUCONATE CLOTH 2 % EX PADS
6.0000 | MEDICATED_PAD | Freq: Every day | CUTANEOUS | Status: DC
  Administered 2023-05-25 – 2023-06-09 (×15): 6 via TOPICAL

## 2023-05-25 MED ORDER — MAGNESIUM SULFATE 2 GM/50ML IV SOLN
2.0000 g | Freq: Once | INTRAVENOUS | Status: AC
  Administered 2023-05-25: 2 g via INTRAVENOUS
  Filled 2023-05-25: qty 50

## 2023-05-25 MED ORDER — FENTANYL 2500MCG IN NS 250ML (10MCG/ML) PREMIX INFUSION
25.0000 ug/h | INTRAVENOUS | Status: DC
  Administered 2023-05-25: 75 ug/h via INTRAVENOUS
  Filled 2023-05-25: qty 250

## 2023-05-25 MED ORDER — FAMOTIDINE 20 MG PO TABS
20.0000 mg | ORAL_TABLET | Freq: Two times a day (BID) | ORAL | Status: DC
Start: 2023-05-25 — End: 2023-05-25
  Filled 2023-05-25: qty 1

## 2023-05-25 MED ORDER — ORAL CARE MOUTH RINSE: 15.0000 mL | OROMUCOSAL | Status: DC | PRN

## 2023-05-25 MED ORDER — SODIUM CHLORIDE 0.9 % IV SOLN: INTRAVENOUS | Status: AC | PRN

## 2023-05-25 MED ORDER — PHENOBARBITAL 32.4 MG PO TABS: 32.4000 mg | ORAL_TABLET | Freq: Three times a day (TID) | ORAL | Status: DC

## 2023-05-25 MED ORDER — POTASSIUM CHLORIDE 20 MEQ PO PACK
40.0000 meq | PACK | Freq: Once | ORAL | Status: AC
  Administered 2023-05-25: 40 meq
  Filled 2023-05-25: qty 2

## 2023-05-25 MED ORDER — LACTATED RINGERS IV BOLUS
1000.0000 mL | Freq: Once | INTRAVENOUS | Status: AC
Start: 2023-05-25 — End: 2023-05-25
  Administered 2023-05-25: 1000 mL via INTRAVENOUS

## 2023-05-25 MED ORDER — PHENOBARBITAL 32.4 MG PO TABS
64.8000 mg | ORAL_TABLET | Freq: Three times a day (TID) | ORAL | Status: DC
  Administered 2023-05-25: 64.8 mg via ORAL
  Filled 2023-05-25: qty 2

## 2023-05-25 MED ORDER — PHENOBARBITAL SODIUM 65 MG/ML IJ SOLN: 65.0000 mg | Freq: Every day | INTRAMUSCULAR | Status: DC

## 2023-05-25 MED ORDER — THIAMINE HCL 100 MG/ML IJ SOLN
250.0000 mg | Freq: Every day | INTRAVENOUS | Status: AC
  Administered 2023-05-27 – 2023-06-01 (×6): 250 mg via INTRAVENOUS
  Filled 2023-05-25 (×8): qty 2.5

## 2023-05-25 MED ORDER — ARFORMOTEROL TARTRATE 15 MCG/2ML IN NEBU
15.0000 ug | INHALATION_SOLUTION | Freq: Two times a day (BID) | RESPIRATORY_TRACT | Status: DC
Start: 2023-05-25 — End: 2023-05-26
  Administered 2023-05-25 – 2023-05-26 (×2): 15 ug via RESPIRATORY_TRACT
  Filled 2023-05-25 (×2): qty 2

## 2023-05-25 MED ORDER — ETOMIDATE 2 MG/ML IV SOLN
INTRAVENOUS | Status: DC | PRN
  Administered 2023-05-25: 20 mg via INTRAVENOUS

## 2023-05-25 MED ORDER — DOCUSATE SODIUM 50 MG/5ML PO LIQD: 100.0000 mg | Freq: Two times a day (BID) | ORAL | Status: DC | PRN

## 2023-05-25 MED ORDER — ADULT MULTIVITAMIN W/MINERALS CH
1.0000 | ORAL_TABLET | Freq: Every day | ORAL | Status: DC
Start: 2023-05-25 — End: 2023-05-25

## 2023-05-25 MED ORDER — SUCCINYLCHOLINE CHLORIDE 20 MG/ML IJ SOLN
INTRAMUSCULAR | Status: DC | PRN
  Administered 2023-05-25: 100 mg via INTRAVENOUS

## 2023-05-25 MED ORDER — POLYETHYLENE GLYCOL 3350 17 G PO PACK: 17.0000 g | PACK | Freq: Every day | ORAL | Status: DC | PRN

## 2023-05-25 MED ORDER — FENTANYL BOLUS VIA INFUSION
25.0000 ug | INTRAVENOUS | Status: DC | PRN
  Administered 2023-05-25 (×2): 50 ug via INTRAVENOUS

## 2023-05-25 MED ORDER — ADULT MULTIVITAMIN W/MINERALS CH
1.0000 | ORAL_TABLET | Freq: Every day | ORAL | Status: DC
  Administered 2023-05-25 – 2023-05-27 (×3): 1 via ORAL
  Filled 2023-05-25 (×3): qty 1

## 2023-05-25 MED ORDER — THIAMINE HCL 100 MG/ML IJ SOLN
100.0000 mg | Freq: Every day | INTRAMUSCULAR | Status: DC
Start: 2023-06-02 — End: 2023-06-04
  Administered 2023-06-02 – 2023-06-03 (×2): 100 mg via INTRAVENOUS
  Filled 2023-05-25 (×2): qty 2

## 2023-05-25 MED ORDER — PROPOFOL 1000 MG/100ML IV EMUL
0.0000 ug/kg/min | INTRAVENOUS | Status: DC
  Administered 2023-05-25: 15 ug/kg/min via INTRAVENOUS
  Administered 2023-05-25: 10 ug/kg/min via INTRAVENOUS
  Filled 2023-05-25: qty 100

## 2023-05-25 MED ORDER — FOLIC ACID 1 MG PO TABS
1.0000 mg | ORAL_TABLET | Freq: Every day | ORAL | Status: DC
  Administered 2023-05-25 – 2023-05-27 (×3): 1 mg via ORAL
  Filled 2023-05-25 (×3): qty 1

## 2023-05-25 MED ORDER — PHENOBARBITAL SODIUM 130 MG/ML IJ SOLN
130.0000 mg | Freq: Three times a day (TID) | INTRAMUSCULAR | Status: AC
  Administered 2023-05-25 – 2023-05-26 (×3): 130 mg via INTRAVENOUS
  Filled 2023-05-25 (×3): qty 1

## 2023-05-25 MED ORDER — DEXMEDETOMIDINE HCL IN NACL 400 MCG/100ML IV SOLN
0.0000 ug/kg/h | INTRAVENOUS | Status: DC
  Administered 2023-05-25: 0.4 ug/kg/h via INTRAVENOUS
  Filled 2023-05-25: qty 100

## 2023-05-25 MED ORDER — HEPARIN SODIUM (PORCINE) 5000 UNIT/ML IJ SOLN
5000.0000 [IU] | Freq: Three times a day (TID) | INTRAMUSCULAR | Status: DC
  Administered 2023-05-25 – 2023-06-08 (×43): 5000 [IU] via SUBCUTANEOUS
  Filled 2023-05-25 (×43): qty 1

## 2023-05-25 MED ORDER — FOLIC ACID 1 MG PO TABS: 1.0000 mg | ORAL_TABLET | Freq: Every day | ORAL | Status: DC

## 2023-05-25 MED ORDER — FAMOTIDINE 20 MG PO TABS
20.0000 mg | ORAL_TABLET | Freq: Two times a day (BID) | ORAL | Status: DC
  Administered 2023-05-25 – 2023-05-26 (×3): 20 mg via ORAL
  Filled 2023-05-25 (×3): qty 1

## 2023-05-25 MED ORDER — THIAMINE HCL 100 MG/ML IJ SOLN
500.0000 mg | Freq: Three times a day (TID) | INTRAVENOUS | Status: AC
  Administered 2023-05-25 – 2023-05-26 (×6): 500 mg via INTRAVENOUS
  Filled 2023-05-25 (×9): qty 5

## 2023-05-25 MED ORDER — REVEFENACIN 175 MCG/3ML IN SOLN
175.0000 ug | Freq: Every day | RESPIRATORY_TRACT | Status: DC
Start: 2023-05-25 — End: 2023-05-26
  Administered 2023-05-25 – 2023-05-26 (×2): 175 ug via RESPIRATORY_TRACT
  Filled 2023-05-25 (×2): qty 3

## 2023-05-25 MED ORDER — LORAZEPAM 2 MG/ML IJ SOLN
1.0000 mg | INTRAMUSCULAR | Status: DC | PRN
  Administered 2023-05-25 – 2023-05-27 (×4): 1 mg via INTRAVENOUS
  Filled 2023-05-25 (×4): qty 1

## 2023-05-25 MED ORDER — POLYETHYLENE GLYCOL 3350 17 G PO PACK: 17.0000 g | PACK | Freq: Every day | ORAL | Status: DC

## 2023-05-25 MED ORDER — ORAL CARE MOUTH RINSE
15.0000 mL | OROMUCOSAL | Status: DC
  Administered 2023-05-25: 15 mL via OROMUCOSAL

## 2023-05-25 MED ORDER — PHENOBARBITAL SODIUM 65 MG/ML IJ SOLN: 65.0000 mg | Freq: Three times a day (TID) | INTRAMUSCULAR | Status: DC

## 2023-05-25 MED ORDER — POLYETHYLENE GLYCOL 3350 17 G PO PACK
17.0000 g | PACK | Freq: Every day | ORAL | Status: DC
  Administered 2023-05-26: 17 g via ORAL
  Filled 2023-05-25: qty 1

## 2023-05-25 MED ORDER — DOCUSATE SODIUM 100 MG PO CAPS
100.0000 mg | ORAL_CAPSULE | Freq: Every day | ORAL | Status: DC
  Administered 2023-05-26: 100 mg via ORAL
  Filled 2023-05-25: qty 1

## 2023-05-25 MED ORDER — FENTANYL CITRATE PF 50 MCG/ML IJ SOSY
25.0000 ug | PREFILLED_SYRINGE | Freq: Once | INTRAMUSCULAR | Status: AC
Start: 2023-05-25 — End: 2023-05-25
  Administered 2023-05-25: 50 ug via INTRAVENOUS

## 2023-05-25 NOTE — Progress Notes (Signed)
Lower extremity venous duplex completed. Please see CV Procedures for preliminary results.  Shona Simpson, RVT 05/25/23 1:12 PM

## 2023-05-25 NOTE — Progress Notes (Signed)
05/25/2023  Went into w/d type symptoms fairly quickly now on phenobarb taper.  CTA head unremarkable for these syncopal events.  Per patient happened once 2 years ago, again last week and then this admission.  Last week preceded by lifting heavy object, this time maybe precipitated by coughing  No prodromal symptoms, no postictal type state  LTVEEG ongoing  CTA head/neck neg  Echo pending  Wife updated at bedside  Myrla Halsted MD PCCM

## 2023-05-25 NOTE — Progress Notes (Signed)
Pt's CIWA score 13. Increasing tremors, agitation, and anxiety. Pt had episode of incontinence as well. 1 mg of ativan given per order. Reached out to Dr. Katrinka Blazing. Phenobarbital dose increased and changed to IV. Pt also having audible wheezing. O2 sats 95% on 4L [place]. Nebs ordered by Dr. Katrinka Blazing.

## 2023-05-25 NOTE — ED Notes (Signed)
Dr. Katrinka Blazing @bedside  - states plan to extubate soon

## 2023-05-25 NOTE — ED Notes (Signed)
Pt returned from CT with this RN and RRT

## 2023-05-25 NOTE — ED Provider Notes (Signed)
[place] EMERGENCY DEPARTMENT AT [facility] Provider Note   CSN: [id number] Arrival date & time: 05/25/23  0145     History  Chief Complaint  Patient presents with   Respiratory Arrest    Ralph [name] is a 73 y.o. male.  HPI Patient presents for unresponsiveness.  Medical history includes asthma, HLD, GERD, arthritis.  Last known well was approximately 30 minutes prior to arrival.  He had a fall at home.  Wife found him on the floor unresponsive.  He remained unresponsive when EMS arrived.  His breathing was assisted with BVM.  Vital signs and CBG were normal.  During transit, patient would wiggle his fingers and toes spontaneously.  He was given nebulized breathing treatment and did briefly open his eyes.  History per wife: Patient did have a syncopal episode yesterday.  He stated that he felt "unwell" yesterday.  Today, he did have some coughing.  This improved with breathing treatments.  This evening, he had a severe coughing spell.  He called out to his wife.  When she came to check on him, he was laying on his back, on the kitchen floor.  He asked her to call 911 and stated that he was dying.  Patient does drink alcohol.  He takes tramadol for pain.  Wife denies any illicit drug use or any other narcotic pain medications.    Home Medications Prior to Admission medications   Medication Sig Start Date End Date Taking? Authorizing Provider  albuterol (PROAIR HFA) 108 (90 Base) MCG/ACT inhaler Inhale 2 puffs into the lungs every 6 (six) hours as needed for wheezing or shortness of breath.    [provider]  aspirin EC 81 MG tablet Take 1 tablet (81 mg total) by mouth daily. 02/20/16   Quintella Reichert, MD  budesonide-formoterol (SYMBICORT) 160-4.5 MCG/ACT inhaler Inhale 2 puffs into the lungs 2 (two) times daily.    [provider]  clonazePAM (KLONOPIN) 0.5 MG tablet Take 0.5 mg by mouth at bedtime.    [provider]  escitalopram (LEXAPRO)  20 MG tablet Take 20 mg by mouth daily.    [provider]  HYDROcodone-acetaminophen (NORCO/VICODIN) 5-325 MG tablet Take 2 tablets by mouth every 4 (four) hours as needed. 10/31/20   Sponseller, Eugene Gavia, PA-C  ibuprofen (ADVIL,MOTRIN) 200 MG tablet Take 400 mg by mouth every 6 (six) hours as needed for pain or headache.    [provider]  isosorbide mononitrate (IMDUR) 30 MG 24 hr tablet Take 0.5 tablets (15 mg total) by mouth daily. 03/20/16 03/15/17  Quintella Reichert, MD  montelukast (SINGULAIR) 10 MG tablet Take 10 mg by mouth daily. 12/05/15   [provider]  omeprazole (PRILOSEC) 20 MG capsule Take 20 mg by mouth daily.    [provider]  rosuvastatin (CRESTOR) 10 MG tablet Take 1 tablet (10 mg total) by mouth daily. 03/20/16 10/17/16  Quintella Reichert, MD  tamsulosin (FLOMAX) 0.4 MG CAPS capsule Take 0.4 mg by mouth at bedtime.    [provider]  Tiotropium Bromide Monohydrate (SPIRIVA RESPIMAT) 2.5 MCG/ACT AERS Inhale 2 puffs into the lungs daily. 11/04/16   Nyoka Cowden, MD      Allergies    Latex and Morphine and codeine    Review of Systems   Review of Systems  Unable to perform ROS: Patient unresponsive    Physical Exam Updated Vital Signs BP (!) 90/54   Pulse 91   Temp 97.8 F (  36.6 C)   Resp 19   Ht 5\' 11"  (1.803 m)   Wt 97 kg   SpO2 98%   BMI 29.83 kg/m  Physical Exam Vitals and nursing note reviewed.  Constitutional:      General: He is not in acute distress.    Appearance: He is well-developed and normal weight. He is not toxic-appearing or diaphoretic.  HENT:     Head: Normocephalic and atraumatic.     Right Ear: External ear normal.     Left Ear: External ear normal.     Nose: Nose normal.     Mouth/Throat:     Mouth: Mucous membranes are moist.  Eyes:     Conjunctiva/sclera: Conjunctivae normal.     Pupils: Pupils are equal, round, and reactive to light.  Cardiovascular:     Rate and Rhythm: Normal  rate and regular rhythm.     Heart sounds: No murmur heard. Pulmonary:     Effort: Pulmonary effort is normal. No respiratory distress.     Breath sounds: Normal breath sounds. No wheezing, rhonchi or rales.  Abdominal:     General: There is no distension.     Palpations: Abdomen is soft.  Musculoskeletal:        General: No swelling or deformity.     Cervical back: Neck supple.     Right lower leg: No edema.     Left lower leg: No edema.  Skin:    General: Skin is warm and dry.     Coloration: Skin is not jaundiced or pale.  Neurological:     GCS: GCS eye subscore is 2. GCS verbal subscore is 1. GCS motor subscore is 1.     Comments: Spontaneous small movements of fingers and toes on all extremities     ED Results / Procedures / Treatments   Labs (all labs ordered are listed, but only abnormal results are displayed) Labs Reviewed  CBC - Abnormal; Notable for the following components:      Result Value   MCV 107.7 (*)    MCH 36.0 (*)    All other components within normal limits  DIFFERENTIAL - Abnormal; Notable for the following components:   Eosinophils Absolute 0.6 (*)    All other components within normal limits  COMPREHENSIVE METABOLIC PANEL - Abnormal; Notable for the following components:   Creatinine, Ser 1.46 (*)    AST 50 (*)    GFR, Estimated 51 (*)    All other components within normal limits  ETHANOL - Abnormal; Notable for the following components:   Alcohol, Ethyl (B) 169 (*)    All other components within normal limits  CBG MONITORING, ED - Abnormal; Notable for the following components:   Glucose-Capillary 114 (*)    All other components within normal limits  I-STAT CHEM 8, ED - Abnormal; Notable for the following components:   Creatinine, Ser 1.70 (*)    Glucose, Bld 102 (*)    All other components within normal limits  I-STAT ARTERIAL BLOOD GAS, ED - Abnormal; Notable for the following components:   pH, Arterial 7.332 (*)    pO2, Arterial 339 (*)     Acid-base deficit 3.0 (*)    All other components within normal limits  RAPID URINE DRUG SCREEN, HOSP PERFORMED  URINALYSIS, ROUTINE W REFLEX MICROSCOPIC  PROTIME-INR  APTT    EKG None  Radiology CT ANGIO HEAD NECK W WO CM Result Date: 05/25/2023 CLINICAL DATA:  Neuro deficit, acute, stroke suspected EXAM:  CT ANGIOGRAPHY HEAD AND NECK WITH AND WITHOUT CONTRAST TECHNIQUE: Multidetector CT imaging of the head and neck was performed using the standard protocol during bolus administration of intravenous contrast. Multiplanar CT image reconstructions and MIPs were obtained to evaluate the vascular anatomy. Carotid stenosis measurements (when applicable) are obtained utilizing NASCET criteria, using the distal internal carotid diameter as the denominator. RADIATION DOSE REDUCTION: This exam was performed according to the departmental dose-optimization program which includes automated exposure control, adjustment of the mA and/or kV according to patient size and/or use of iterative reconstruction technique. CONTRAST:  75mL OMNIPAQUE IOHEXOL 350 MG/ML SOLN COMPARISON:  None Available. FINDINGS: CT HEAD FINDINGS Brain: No evidence of acute infarction, hemorrhage, hydrocephalus, extra-axial collection or mass lesion/mass effect. Vascular: See below. Skull: No acute fracture. Sinuses/Orbits: Mucosal thickening with air-fluid level in the right maxillary sinus. Other: No mastoid effusions. Review of the MIP images confirms the above findings CTA NECK FINDINGS Aortic arch: Great vessel origins are patent without significant stenosis. Right carotid system: No evidence of dissection, stenosis (50% or greater), or occlusion. Left carotid system: No evidence of dissection, stenosis (50% or greater), or occlusion. Vertebral arteries: Codominant. No evidence of dissection, stenosis (50% or greater), or occlusion. Skeleton: No acute abnormality on limited assessment. Other neck: No acute abnormality on limited  assessment. Upper chest: Visualized lung apices are clear. Review of the MIP images confirms the above findings CTA HEAD FINDINGS Anterior circulation: Bilateral intracranial ICAs, MCAs, and ACAs are patent without proximal hemodynamically significant stenosis. Posterior circulation: Bilateral intradural vertebral arteries, basilar artery and bilateral posterior cerebral arteries are patent without proximal hemodynamically significant stenosis. Left fetal type PCA. Venous sinuses: As permitted by contrast timing, patent. Anatomic variants: A detailed above. Review of the MIP images confirms the above findings IMPRESSION: 1. No evidence of acute intracranial abnormality. 2. No emergent large vessel occlusion or proximal hemodynamically significant stenosis. Electronically Signed   By: Feliberto Harts M.D.   On: 05/25/2023 02:54   CT CERVICAL SPINE WO CONTRAST Result Date: 05/25/2023 CLINICAL DATA:  Neck trauma, intoxicated or obtunded (Age >= 16y). Fall, found down EXAM: CT CERVICAL SPINE WITHOUT CONTRAST TECHNIQUE: Multidetector CT imaging of the cervical spine was performed without intravenous contrast. Multiplanar CT image reconstructions were also generated. RADIATION DOSE REDUCTION: This exam was performed according to the departmental dose-optimization program which includes automated exposure control, adjustment of the mA and/or kV according to patient size and/or use of iterative reconstruction technique. COMPARISON:  None Available. FINDINGS: Alignment: Normal. Skull base and vertebrae: No acute fracture. No primary bone lesion or focal pathologic process. Soft tissues and spinal canal: No prevertebral fluid or swelling. No visible canal hematoma. Disc levels: Diffuse intervertebral disc space narrowing and endplate remodeling is seen throughout the cervical spine, most severe at C4-C7 in keeping with changes of to severe degenerative disc disease. Prevertebral soft tissues are not thickened.  Endotracheal tube and nasogastric tube ir identified. Mild central canal stenosis, most severe at C4-5 secondary to a posterior disc osteophyte complex with abutment of the thecal sac without significant. Multilevel uncovertebral and facet arthrosis is seen with resultant moderate severe bilateral neuroforaminal narrowing, most severe at C5-6 Upper chest: Negative. Other: None IMPRESSION: 1. No acute fracture or listhesis of the cervical spine. 2. Multilevel degenerative disc and degenerative joint disease resulting in mild central canal stenosis and moderate to severe bilateral neuroforaminal narrowing, most severe at C5-6. Electronically Signed   By: Helyn Numbers M.D.   On: 05/25/2023 02:52  DG Abd 1 View Result Date: 05/25/2023 CLINICAL DATA:  Orogastric tube placement EXAM: ABDOMEN - 1 VIEW COMPARISON:  None Available. FINDINGS: The bowel gas pattern is normal. No radio-opaque calculi or other significant radiographic abnormality are seen. Orogastric tube tip and side port project in the stomach. IMPRESSION: Orogastric tube tip and side port project in the stomach. Electronically Signed   By: Deatra Robinson M.D.   On: 05/25/2023 02:29   DG Chest Portable 1 View Result Date: 05/25/2023 CLINICAL DATA:  Intubation EXAM: PORTABLE CHEST 1 VIEW COMPARISON:  02/01/2019 FINDINGS: Support Apparatus: --Endotracheal tube: Tip at the level of the clavicular heads. --Enteric tube:Tip and sideport project over the stomach. --Vascular catheter(s):None --Other: None Bibasilar atelectasis, left-greater-than-right. No focal airspace consolidation. No pleural effusion or pneumothorax. IMPRESSION: 1. Endotracheal tube tip at the level of the clavicular heads. 2. Enteric tube tip and sideport project over the stomach. Electronically Signed   By: Deatra Robinson M.D.   On: 05/25/2023 02:28    Procedures Procedure Name: Intubation Date/Time: 05/25/2023 1:58 AM  Performed by: Gloris Manchester, MDOxygen Delivery Method: Ambu  bag Preoxygenation: Pre-oxygenation with 100% oxygen Induction Type: Rapid sequence Ventilation: Mask ventilation without difficulty Laryngoscope Size: Glidescope Grade View: Grade I Tube size: 7.5 mm Number of attempts: 1 Airway Equipment and Method: Rigid stylet and Video-laryngoscopy Placement Confirmation: ETT inserted through vocal cords under direct vision, Positive ETCO2 and Breath sounds checked- equal and bilateral Secured at: 24 cm Tube secured with: Tape Dental Injury: Teeth and Oropharynx as per pre-operative assessment         Medications Ordered in ED Medications  etomidate (AMIDATE) injection (20 mg Intravenous Given 05/25/23 0149)  succinylcholine (ANECTINE) injection (100 mg Intravenous Given 05/25/23 0150)  docusate (COLACE) 50 MG/5ML liquid 100 mg (has no administration in time range)  polyethylene glycol (MIRALAX / GLYCOLAX) packet 17 g (has no administration in time range)  fentaNYL in NS (40mcg/ml) infusion-PREMIX (75 mcg/hr Intravenous New Bag/Given 05/25/23 0204)  fentaNYL (SUBLIMAZE) bolus via infusion 25-100 mcg (50 mcg Intravenous Bolus from Bag 05/25/23 0158)  propofol (DIPRIVAN) 1000 MG/100ML infusion (20 mcg/kg/min  97 kg Intravenous Rate/Dose Change 05/25/23 0306)  thiamine (VITAMIN B1) 500 mg in sodium chloride 0.9 % 50 mL IVPB (has no administration in time range)    Followed by  thiamine (VITAMIN B1) 250 mg in sodium chloride 0.9 % 50 mL IVPB (has no administration in time range)    Followed by  thiamine (VITAMIN B1) injection 100 mg (has no administration in time range)  fentaNYL (SUBLIMAZE) injection 25 mcg (50 mcg Intravenous Given 05/25/23 0205)  albuterol (PROVENTIL) (2.5 MG/3ML) 0.083% nebulizer solution 5 mg (5 mg Nebulization Given 05/25/23 0243)  lactated ringers bolus 1,000 mL (1,000 mLs Intravenous New Bag/Given 05/25/23 0302)  iohexol (OMNIPAQUE) 350 MG/ML injection 75 mL (75 mLs Intravenous Contrast Given 05/25/23 0241)    ED Course/  Medical Decision Making/ A&P                                 Medical Decision Making Amount and/or Complexity of Data Reviewed Labs: ordered. Radiology: ordered.  Risk OTC drugs. Prescription drug management.   This patient presents to the ED for concern of unresponsiveness, this involves an extensive number of treatment options, and is a complaint that carries with it a high risk of complications and morbidity.  The differential diagnosis includes intoxication, ICH, CVA, seizure, metabolic derangements  Co morbidities that complicate the patient evaluation  asthma, HLD, GERD, arthritis   Additional history obtained:  Additional history obtained from EMS, patient's wife External records from outside source obtained and reviewed including EMR   Lab Tests:  I Ordered, and personally interpreted labs.  The pertinent results include: CKD of unknown chronicity, normal electrolytes, normal hemoglobin, no leukocytosis.  His ethanol level is elevated.  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   Imaging Studies ordered:  I ordered imaging studies including x-ray of chest and abdomen, CTA head and neck, CT of cervical spine I independently visualized and interpreted imaging which showed no acute findings on CT imaging. I agree with the radiologist interpretation   Cardiac Monitoring: / EKG:  The patient was maintained on a cardiac monitor.  I personally viewed and interpreted the cardiac monitored which showed an underlying rhythm of: Sinus rhythm   Consultations Obtained:  I requested consultation with the neurologist, Dr. Derry Lory,  and discussed lab and imaging findings as well as pertinent plan - they recommend: High-dose thiamine; further recommendations pending I requested consultation with the intensivist, Dr. Gaynell Face,  and discussed lab and imaging findings as well as pertinent plan - they recommend: Admission to ICU   Problem List / ED Course /  Critical interventions / Medication management  Patient presenting for unresponsiveness.  Per wife, he did have some coughing spells today.  He had a syncopal episode yesterday.  This evening, he was found on his back, on the kitchen floor.  He was awake at the time and asking her to call 911.  EMS reports that he has been unresponsive with them.  He is unresponsive on his arrival in the ED.  I did get him to briefly open his eyes but he seemed otherwise unresponsive to pain.  Given his GCS, patient was intubated for airway protection.  After intubation, patient did have large gross movements with his extremities.  Propofol and fentanyl were ordered for sedation.  Workup was initiated.  Patient underwent CTA of head and neck.  No acute findings were identified.  I spoke with neurology, who will see the patient in consult.  I spoke with critical care, who will admit the patient. I ordered medication including etomidate and succinylcholine for RSI; fentanyl and propofol for postintubation sedation; IV fluids for hydration Reevaluation of the patient after these medicines showed that the patient improved I have reviewed the patients home medicines and have made adjustments as needed   Social Determinants of Health:  Lives at home with wife  CRITICAL CARE Performed by: Gloris Manchester   Total critical care time: 32 minutes  Critical care time was exclusive of separately billable procedures and treating other patients.  Critical care was necessary to treat or prevent imminent or life-threatening deterioration.  Critical care was time spent personally by me on the following activities: development of treatment plan with patient and/or surrogate as well as nursing, discussions with consultants, evaluation of patient's response to treatment, examination of patient, obtaining history from patient or surrogate, ordering and performing treatments and interventions, ordering and review of laboratory studies,  ordering and review of radiographic studies, pulse oximetry and re-evaluation of patient's condition.        Final Clinical Impression(s) / ED Diagnoses Final diagnoses:  Unresponsiveness    Rx / DC Orders ED Discharge Orders     None         Gloris Manchester, MD 05/25/23 [phone]

## 2023-05-25 NOTE — ED Notes (Signed)
Neurologist at bedside to assess patient. Patient awoken and became agitated - trying to pull ETT. Sedation bolus's given per [facility] - gtt's adjusted.

## 2023-05-25 NOTE — Progress Notes (Signed)
Waking up. Can likely extubate. Has bed upstairs.

## 2023-05-25 NOTE — ED Notes (Signed)
ETT advanced 2 cm --> 24cm @lips  by RRT - per VO from Dr. Durwin Nora

## 2023-05-25 NOTE — Progress Notes (Signed)
Came again for echo, but patient is getting set up for eeg at this time.

## 2023-05-25 NOTE — Consult Note (Addendum)
NEUROLOGY CONSULT NOTE   Date of service: May 25, 2023 Patient Name: Ralph [name] MRN:  [id number] DOB:  December 03, 1950 Chief Complaint: "poor responsiveness, intubated" Requesting Provider: Gloris Manchester, MD  History of Present Illness  Ralph [name] is a 73 y.o. male with hx of Asthma, GERD, nephrolithiasis, HLD, who apparently had a fall at home. Ralph [name] had a bout of cough and felt like something was stuck in his throat. Was coughing a lot in an attempt to clear his throat and wife left the room to do something else. Ralph [name] called out for significant other after Ralph [name] had fallen and asked her to call EMS. Ralph [name] was moving everything and talking to her. When EMS got there, Ralph [name] was less responsive. Ralph [name] was being bagged by EMS. Ralph [name] would wiggle his toes and move his hands and fingers spontaneously.  Significant other reports that yesterday, Ralph [name] was not feeling well. Has asthma and was short of breath and helped himself down on the grass and passed out after Ralph [name] moved some stuff from his car trunk.  Does drink Vodka, about 12-24 Oz a day. Patient does not think that is too much alcohol.  No prior hx of seizures, no family hx of seizures, no hx of strokes or ICH.  In the ED, Ralph [name] was intubated for poor mentation. Post intubation, vigorously moving around and uncomfortable.  Neurology consulted for evaluation for poor responsiveness.  CT Head w/o contrast with no acute abnormalities. CTA with no LVO, specifically no basilar occlusion   ROS   Unable to ascertain due to intubation and sedation.  Past History   Past Medical History:  Diagnosis Date   Arthritis    hands   Asthma    GERD (gastroesophageal reflux disease)    History of kidney stones    x2  -episodes   Hyperlipidemia    Renal disorder    Varicose veins    bilateral surgeries- no problems now    Past Surgical History:  Procedure Laterality Date   COLONOSCOPY WITH PROPOFOL N/A 03/20/2015   Procedure: COLONOSCOPY WITH PROPOFOL;   Surgeon: Charolett Bumpers, MD;  Location: [facility];  Service: Endoscopy;  Laterality: N/A;   HERNIA REPAIR     inguinal hernia   TONSILLECTOMY     VARICOSE VEIN SURGERY Bilateral    2 years ago   VASECTOMY      Family History: Family History  Problem Relation Age of Onset   Dementia Mother    COPD Father     Social History  reports that Ralph [name] quit smoking about 10 years ago. His smoking use included cigarettes. Ralph [name] started smoking about 30 years ago. Ralph [name] has a 10 pack-year smoking history. Ralph [name] has never used smokeless tobacco. Ralph [name] reports current alcohol use. Ralph [name] reports that Ralph [name] does not use drugs.  Allergies  Allergen Reactions   Latex Hives and Rash   Morphine And Codeine Hives    Medications   Current Facility-Administered Medications:    docusate (COLACE) 50 MG/5ML liquid 100 mg, 100 mg, Per Tube, BID, Gloris Manchester, MD   etomidate (AMIDATE) injection, , Intravenous, Code/Trauma/Sedation Med, Gloris Manchester, MD, 20 mg at 05/25/23 0149   fentaNYL (SUBLIMAZE) bolus via infusion 25-100 mcg, 25-100 mcg, Intravenous, Q15 min PRN, Gloris Manchester, MD, 50 mcg at 05/25/23 0314   fentaNYL in NS (37mcg/ml) infusion-PREMIX, 25-200 mcg/hr, Intravenous, Continuous, Gloris Manchester, MD, Last Rate: 7.5 mL/hr at 05/25/23 0204, 75 mcg/hr at 05/25/23 0204   polyethylene glycol (MIRALAX /  GLYCOLAX) packet 17 g, 17 g, Per Tube, Daily, Gloris Manchester, MD   propofol (DIPRIVAN) 1000 MG/100ML infusion, 0-50 mcg/kg/min, Intravenous, Continuous, Gloris Manchester, MD, Last Rate: 20.4 mL/hr at May 31, 2023 0331, 35 mcg/kg/min at 2023-05-31 0331   succinylcholine (ANECTINE) injection, , Intravenous, Code/Trauma/Sedation Med, Gloris Manchester, MD, 100 mg at 31-May-2023 0150   thiamine (VITAMIN B1) 500 mg in sodium chloride 0.9 % 50 mL IVPB, 500 mg, Intravenous, Q8H **FOLLOWED BY** [START ON 05/27/2023] thiamine (VITAMIN B1) 250 mg in sodium chloride 0.9 % 50 mL IVPB, 250 mg, Intravenous, Daily **FOLLOWED BY** [START ON 06/02/2023]  thiamine (VITAMIN B1) injection 100 mg, 100 mg, Intravenous, Daily, Erick Blinks, MD  Current Outpatient Medications:    ALPRAZolam (XANAX) 0.5 MG tablet, Take 0.5 mg by mouth daily as needed for anxiety., Disp: , Rfl:    Budeson-Glycopyrrol-Formoterol (BREZTRI AEROSPHERE) 160-9-4.8 MCG/ACT AERO, Inhale 1 puff into the lungs daily., Disp: , Rfl:    escitalopram (LEXAPRO) 20 MG tablet, Take 20 mg by mouth daily., Disp: , Rfl:    omeprazole (PRILOSEC) 20 MG capsule, Take 20 mg by mouth daily., Disp: , Rfl:    tamsulosin (FLOMAX) 0.4 MG CAPS capsule, Take 0.4 mg by mouth at bedtime., Disp: , Rfl:    traMADol (ULTRAM) 50 MG tablet, Take 50 mg by mouth daily as needed for moderate pain (pain score 4-6)., Disp: , Rfl:    rosuvastatin (CRESTOR) 40 MG tablet, Take 40 mg by mouth daily. (Patient not taking: Reported on May 31, 2023), Disp: , Rfl:   Vitals   Vitals:   05-31-23 0207 05-31-2023 0245 May 31, 2023 0309 2023/05/31 0310  BP: 138/76 104/74 (!) 90/54   Pulse: 96 90 91   Resp: (!) 21 20 19    Temp:   97.8 F (36.6 C)   SpO2: 100% 100% 98% 98%  Weight:      Height:        Body mass index is 29.83 kg/m.  Physical Exam   General: Laying comfortably in bed; in no acute distress.  HENT: Normal oropharynx and mucosa. Normal external appearance of ears and nose.  Neck: Supple, no pain or tenderness  CV: No JVD. No peripheral edema.  Pulmonary: Symmetric Chest rise. Breathing over vent. Abdomen: Soft to touch, non-tender.  Ext: No cyanosis, edema, or deformity  Skin: No rash. Normal palpation of skin.   Musculoskeletal: Normal digits and nails by inspection. No clubbing.   Neurologic Examination  Mental status/Cognition: no response to voice, to slight tactile stimulation, opens eyes, makes eye contact on both side. Agitated, trying to sit up in the bed and self extubate. Speech/language: mute, does not follow commands. Does not answer questions. Gets very agitated and not redirectable with  tactile stimulation Cranial nerves:   CN II Pupils equal and reactive to light, unable to assess for VF deficit.   CN III,IV,VI Makes eye contact on left and right, thou briefly.   CN V Corneals intact BL   CN VII Symmetric facial grimace.   CN VIII normal hearing to speech   CN IX & X Cough and gag intact. Spontaneously coughing on ETT.   CN XI Head is midline.   CN XII Tongue pushed to the floor of the mouth by ETT.   Sensory/Motor:  Muscle bulk: normal Moves all extremities spontaneously and antigravity and purposefully with tactile stimulation.  Coordination/Complex Motor:  - Unable to assess.  Labs/Imaging/Neurodiagnostic studies   CBC:  Recent Labs  Lab 05-31-2023 0207 2023-05-31 0211 05-31-2023 0251  WBC 9.6  --   --  NEUTROABS 5.5  --   --   HGB 15.4 14.6 13.6  HCT 46.1 43.0 40.0  MCV 107.7*  --   --   PLT 198  --   --    Basic Metabolic Panel:  Lab Results  Component Value Date   NA 137 05/25/2023   K 3.9 05/25/2023   CO2 22 05/25/2023   GLUCOSE 102 (H) 05/25/2023   BUN 21 05/25/2023   CREATININE 1.70 (H) 05/25/2023   CALCIUM 9.5 05/25/2023   GFRNONAA 51 (L) 05/25/2023   GFRAA 55 (L) 03/21/2011   Lipid Panel: No results found for: "LDLCALC" HgbA1c: No results found for: "HGBA1C" Urine Drug Screen: No results found for: "LABOPIA", "COCAINSCRNUR", "LABBENZ", "AMPHETMU", "THCU", "LABBARB"  Alcohol Level     Component Value Date/Time   ETH 169 (H) 05/25/2023 0207   INR No results found for: "INR" APTT No results found for: "APTT" AED levels: No results found for: "PHENYTOIN", "ZONISAMIDE", "LAMOTRIGINE", "LEVETIRACETA"  CT Head without contrast(Personally reviewed): CTH was negative for a large hypodensity concerning for a large territory infarct or hyperdensity concerning for an ICH  CT angio Head and Neck with contrast(Personally reviewed): No LVO  MRI Brain: pending  Neurodiagnostics rEEG:  pending  ASSESSMENT   Ralph [name] is a 73  y.o. male with hx of Asthma, GERD, nephrolithiasis, HLD, alcohol use disorder who apparently had a fall at home and passed out following a bout of cough. Was talking to wife after the fall and then had a decline in mentation and not doing much for EMS and intubated upon arrival to the ED.  Odd presentation, perhaps Ralph [name] was just intoxicated but EtOh levels are not that significantly elevated. Could have had a seizure but wife denies any noted seizure like activity or hx of seizure, none noted by EMS. No basilar artery thrombosis noted on CTA, ?artery of percheron stroke. No fever or other clear signs of meningitis, discussing this with wife, this seems to be very abrupt in onset and Ralph [name] was fine all day otherwise. Will get MRI Brain and cEEG in AM.  RECOMMENDATIONS  - MRI Brain w/o contrast - cEEG in AM. Low suspicion for continued seizure as Ralph [name] seems to wake up and make eye contact but gets very agitated and attempts to pull on ETT. - Thiamine high dose replacement. - Ammonia, TSH, B12, Folate. ______________________________________________________________________  This patient is critically ill and at significant risk of neurological worsening, death and care requires constant monitoring of vital signs, hemodynamics,respiratory and cardiac monitoring, neurological assessment, discussion with family, other specialists and medical decision making of high complexity. I spent 40 minutes of neurocritical care time  in the care of  this patient. This was time spent independent of any time provided by nurse practitioner or PA.  Erick Blinks [facility] 05/25/2023  3:48 AM  Signed, Erick Blinks, MD [facility]

## 2023-05-25 NOTE — ED Notes (Signed)
Report called to [facility] ICU - > Colin Mulders RN

## 2023-05-25 NOTE — Progress Notes (Signed)
  Echocardiogram 2D Echocardiogram has been performed.  Delcie Roch 05/25/2023, 5:54 PM

## 2023-05-25 NOTE — Progress Notes (Signed)
Came bedside for "stat" echo, but nurse said patient is about to be extubated and requests we come back later.

## 2023-05-25 NOTE — Procedures (Signed)
Extubation Procedure Note  Patient Details:   Name: Ralph [name] DOB: Aug 01, 1950 MRN: [id number]   Airway Documentation:    Vent end date: 05/25/23 Vent end time: 0930   Evaluation  O2 sats: stable throughout Complications: No apparent complications Patient did tolerate procedure well. Bilateral Breath Sounds: Clear, Diminished   Yes  Patient extubated to 4L cannula. Cuff leak noted. No evidence of stridor. Patient able to speak. No complications.  Pasty Arch 05/25/2023, 9:33 AM

## 2023-05-25 NOTE — H&P (Signed)
NAME:  Ralph [name], MRN:  [id number], DOB:  [birth year], LOS: 0 ADMISSION DATE:  05/25/2023, CONSULTATION DATE:  05/25/23 REFERRING MD:  Durwin Nora, EDP, CHIEF COMPLAINT:  respiratory arrest   History of Present Illness:  73 year old male with past medical history of hyperlipidemia, GERD, asthma, alcohol use who presented to the emergency department with respiratory arrest and unresponsiveness.  Per ED provider, significant other found him on the floor unresponsive about 30 minutes prior to arrival to the emergency department after a fall.  He required BVM assistance by EMS.  Additionally, significant other notes that he had a syncopal episode yesterday, had some coughing today. She states these episodes seemed to happen after he coughs, feels mildly short of breath "like his asthma is starting up" and then falls to the floor.   On my interview, significant other is at the bedside. She says he has otherwise been in his normal state of health. She denies him complaining of chest pain, shortness of breath, fever, n/v/d. No recent long flights or drives. No complaints of headache, nausea, visual changes, weakness. She notes yesterday he got out of the car, began coughing and lowered himself to the ground. States he felt like he had passed out but maybe not. Additionally, today, she states he was clearing his throat at the sink, and then called out for her. She states he was laying on the ground stating he felt like he was going to die. States he does drink alcohol daily, about "4 shots of liquor and a few beers." She denies him every having alcohol withdrawals. States his asthma is under control with his daily maintenance inhalers.   Pertinent  Medical History  Hyperlipidemia, GERD, asthma, alcohol use  Significant Hospital Events: Including procedures, antibiotic start and stop dates in addition to other pertinent events   2/3: Admit to CCM  Interim History / Subjective:  Patient is unable to  participate in the subjective portion of the exam   Objective   Blood pressure (!) 90/54, pulse 91, temperature 97.8 F (36.6 C), resp. rate 19, height 5\' 11"  (1.803 m), weight 97 kg, SpO2 98%.    Vent Mode: PRVC FiO2 (%):  [40 %-100 %] 40 % Set Rate:  [20 bmp] 20 bmp Vt Set:  [600 mL] 600 mL PEEP:  [5 cmH20] 5 cmH20 Plateau Pressure:  [16 cmH20-22 cmH20] 22 cmH20  No intake or output data in the 24 hours ending 05/25/23 0328 Filed Weights   05/25/23 0150  Weight: 97 kg    Examination: General: older male, laying in bed, no acute distress  HENT: /AT, anicteric sclera, pupils 2mm, sluggish, ETT Lungs: clear bilaterally without wheezing, rhonchi, rales, vented Cardiovascular: s1s2, no murmur, rub, gallop Abdomen: rounded, ?mildly distended, BS diminished  Extremities: no pitting edema Neuro: moves all extremities against gravity +gag/cough, PERRLA  GU: foley  Resolved Hospital Problem list    Assessment & Plan:  Altered mental status  Odd presentation. Described as brief episodes of ?syncope vs getting lightheaded after coughing. CTA without abnormalities. Glucose normal. No significant electrolyte dysfunction. EtOH 169. Denied having chest pain, recent meningitic symptoms like fever, headache, neck stiffness.  - neurology consulted appreciate recs   - MRI brain   - cEEG   - high dose thiamine  - - f/u ammonia, TSH, B12, folate - will add on EKG, trop, bnp, venous duplex to rule out PE as a cause of his presentation. He already received contrast and has AKI. If surrogate work up is  concerning can weight risk benefit of repeated contrast dosing  - f/u echo   Acute respiratory failure 2/2 above and inability to protect airway  - full mechanical vent support - lung protective ventilation 6-8cc/kg Vt - VAP and PAD bundle in place  - titrate FiO2 to sat goal >92  - maintain peak/plats <30, driving pressures <16    Alcohol use  - on thiamine, folic acid, mtvn  - watch for  withdrawal symptoms - treat with benzos - counsel on cessation   HLD  - can restart rosuvastatin when clinically appropriate   GERD - h2b Best Practice (right click and "Reselect all SmartList Selections" daily)   Diet/type: NPO DVT prophylaxis: prophylactic heparin  Pressure ulcer(s): na GI prophylaxis: H2B Lines: N/A Foley:  Yes, and it is still needed Code Status:  full code Last date of multidisciplinary goals of care discussion [updated wife on plan of care while interviewing her]  Labs   CBC: Recent Labs  Lab 05/25/23 0207 05/25/23 0211 05/25/23 0251  WBC 9.6  --   --   NEUTROABS 5.5  --   --   HGB 15.4 14.6 13.6  HCT 46.1 43.0 40.0  MCV 107.7*  --   --   PLT 198  --   --     Basic Metabolic Panel: Recent Labs  Lab 05/25/23 0207 05/25/23 0211 05/25/23 0251  NA 140 140 137  K 4.7 4.6 3.9  CL 104 105  --   CO2 22  --   --   GLUCOSE 99 102*  --   BUN 17 21  --   CREATININE 1.46* 1.70*  --   CALCIUM 9.5  --   --    GFR: Estimated Creatinine Clearance: 46.7 mL/min (A) (by C-G formula based on SCr of 1.7 mg/dL (H)). Recent Labs  Lab 05/25/23 0207  WBC 9.6    Liver Function Tests: Recent Labs  Lab 05/25/23 0207  AST 50*  ALT 36  ALKPHOS 46  BILITOT 1.1  PROT 6.5  ALBUMIN 3.8   No results for input(s): "LIPASE", "AMYLASE" in the last 168 hours. No results for input(s): "AMMONIA" in the last 168 hours.  ABG    Component Value Date/Time   PHART 7.332 (L) 05/25/2023 0251   PCO2ART 42.8 05/25/2023 0251   PO2ART 339 (H) 05/25/2023 0251   HCO3 22.8 05/25/2023 0251   TCO2 24 05/25/2023 0251   ACIDBASEDEF 3.0 (H) 05/25/2023 0251   O2SAT 100 05/25/2023 0251     Coagulation Profile: No results for input(s): "INR", "PROTIME" in the last 168 hours.  Cardiac Enzymes: No results for input(s): "CKTOTAL", "CKMB", "CKMBINDEX", "TROPONINI" in the last 168 hours.  HbA1C: No results found for: "HGBA1C"  CBG: Recent Labs  Lab 05/25/23 0150   GLUCAP 114*    Review of Systems:   As above  Past Medical History:  He,  has a past medical history of Arthritis, Asthma, GERD (gastroesophageal reflux disease), History of kidney stones, Hyperlipidemia, Renal disorder, and Varicose veins.   Surgical History:   Past Surgical History:  Procedure Laterality Date   COLONOSCOPY WITH PROPOFOL N/A 03/20/2015   Procedure: COLONOSCOPY WITH PROPOFOL;  Surgeon: Charolett Bumpers, MD;  Location: [facility];  Service: Endoscopy;  Laterality: N/A;   HERNIA REPAIR     inguinal hernia   TONSILLECTOMY     VARICOSE VEIN SURGERY Bilateral    2 years ago   VASECTOMY       Social History:  reports that he quit smoking about 10 years ago. His smoking use included cigarettes. He started smoking about 30 years ago. He has a 10 pack-year smoking history. He has never used smokeless tobacco. He reports current alcohol use. He reports that he does not use drugs.   Family History:  His family history includes COPD in his father; Dementia in his mother.   Allergies Allergies  Allergen Reactions   Latex Hives and Rash   Morphine And Codeine Hives     Home Medications  Prior to Admission medications   Medication Sig Start Date End Date Taking? Authorizing Provider  ALPRAZolam Prudy Feeler) 0.5 MG tablet Take 0.5 mg by mouth daily as needed for anxiety.   Yes [provider]  Budeson-Glycopyrrol-Formoterol (BREZTRI AEROSPHERE) 160-9-4.8 MCG/ACT AERO Inhale 1 puff into the lungs daily.   Yes [provider]  escitalopram (LEXAPRO) 20 MG tablet Take 20 mg by mouth daily.   Yes [provider]  omeprazole (PRILOSEC) 20 MG capsule Take 20 mg by mouth daily.   Yes [provider]  tamsulosin (FLOMAX) 0.4 MG CAPS capsule Take 0.4 mg by mouth at bedtime.   Yes [provider]  traMADol (ULTRAM) 50 MG tablet Take 50 mg by mouth daily as needed for moderate pain (pain score 4-6).   Yes [provider]   rosuvastatin (CRESTOR) 40 MG tablet Take 40 mg by mouth daily. Patient not taking: Reported on 05/25/2023    [provider]     Critical care time: 7    Lenard Galloway  Pulmonary & Critical Care 05/25/23 5:47 AM  Please see [email or web address] for pager details.  From 7A-7P if no response, please call [phone] After hours, please call ELink [phone]

## 2023-05-25 NOTE — Progress Notes (Signed)
 LTM EEG hooked up and running - no initial skin breakdown - push button tested - [facility] monitoring.

## 2023-05-25 NOTE — Progress Notes (Signed)
Patient transported from ED25 to 2M04 with RT, RT student, RN and Environmental health practitioner. No complications noted, vitals stable.

## 2023-05-25 NOTE — Progress Notes (Signed)
Came bedside for echo, but patient is eating at this time.

## 2023-05-25 NOTE — ED Triage Notes (Signed)
Pt via [facility] from home. Wife called EMS d/t hearing patient fall and finding him unresponsive. On EMS arrival, pt was apneic; o2 sat 88%. BVM @ 100% - duoneb given en route. GCS 3/15 LKW 30 min pta.

## 2023-05-26 ENCOUNTER — Encounter (HOSPITAL_COMMUNITY): Payer: Self-pay | Admitting: Critical Care Medicine

## 2023-05-26 ENCOUNTER — Other Ambulatory Visit: Payer: Self-pay

## 2023-05-26 DIAGNOSIS — R4189 Other symptoms and signs involving cognitive functions and awareness: Secondary | ICD-10-CM

## 2023-05-26 DIAGNOSIS — R55 Syncope and collapse: Secondary | ICD-10-CM

## 2023-05-26 DIAGNOSIS — J9601 Acute respiratory failure with hypoxia: Secondary | ICD-10-CM | POA: Diagnosis not present

## 2023-05-26 DIAGNOSIS — R569 Unspecified convulsions: Secondary | ICD-10-CM | POA: Diagnosis not present

## 2023-05-26 DIAGNOSIS — R092 Respiratory arrest: Secondary | ICD-10-CM | POA: Diagnosis not present

## 2023-05-26 LAB — BASIC METABOLIC PANEL
Anion gap: 10 (ref 5–15)
BUN: 13 mg/dL (ref 8–23)
CO2: 26 mmol/L (ref 22–32)
Calcium: 8.9 mg/dL (ref 8.9–10.3)
Chloride: 104 mmol/L (ref 98–111)
Creatinine, Ser: 1.11 mg/dL (ref 0.61–1.24)
GFR, Estimated: >60 mL/min (ref >60)
Glucose, Bld: 98 mg/dL (ref 70–99)
Potassium: 4.2 mmol/L (ref 3.5–5.1)
Sodium: 140 mmol/L (ref 135–145)

## 2023-05-26 LAB — GLUCOSE, CAPILLARY
Glucose-Capillary: 112 mg/dL — ABNORMAL HIGH (ref 70–99)
Glucose-Capillary: 97 mg/dL (ref 70–99)
Glucose-Capillary: 102 mg/dL — ABNORMAL HIGH (ref 70–99)
Glucose-Capillary: 116 mg/dL — ABNORMAL HIGH (ref 70–99)
Glucose-Capillary: 97 mg/dL (ref 70–99)

## 2023-05-26 LAB — CBC
HCT: 39.4 % (ref 39.0–52.0)
Hemoglobin: 13.5 g/dL (ref 13.0–17.0)
MCH: 36.9 pg — ABNORMAL HIGH (ref 26.0–34.0)
MCHC: 34.3 g/dL (ref 30.0–36.0)
MCV: 107.7 fL — ABNORMAL HIGH (ref 80.0–100.0)
Platelets: 157 10*3/uL (ref 150–400)
RBC: 3.66 MIL/uL — ABNORMAL LOW (ref 4.22–5.81)
RDW: 12.5 % (ref 11.5–15.5)
WBC: 9.2 10*3/uL (ref 4.0–10.5)
nRBC: 0 % (ref 0.0–0.2)

## 2023-05-26 LAB — TRIGLYCERIDES: Triglycerides: 104 mg/dL (ref <150)

## 2023-05-26 MED ORDER — PNEUMOCOCCAL 20-VAL CONJ VACC 0.5 ML IM SUSY
0.5000 mL | PREFILLED_SYRINGE | INTRAMUSCULAR | Status: DC
  Filled 2023-05-26: qty 0.5

## 2023-05-26 MED ORDER — SENNOSIDES-DOCUSATE SODIUM 8.6-50 MG PO TABS
1.0000 | ORAL_TABLET | Freq: Two times a day (BID) | ORAL | Status: DC
Start: 2023-05-26 — End: 2023-05-28
  Administered 2023-05-26 – 2023-05-27 (×3): 1 via ORAL
  Filled 2023-05-26 (×3): qty 1

## 2023-05-26 MED ORDER — TAMSULOSIN HCL 0.4 MG PO CAPS
0.4000 mg | ORAL_CAPSULE | Freq: Every day | ORAL | Status: DC
  Administered 2023-05-26 – 2023-05-27 (×2): 0.4 mg via ORAL
  Filled 2023-05-26 (×2): qty 1

## 2023-05-26 MED ORDER — POLYETHYLENE GLYCOL 3350 17 G PO PACK
17.0000 g | PACK | Freq: Two times a day (BID) | ORAL | Status: DC
  Filled 2023-05-26 (×2): qty 1

## 2023-05-26 MED ORDER — ESCITALOPRAM OXALATE 10 MG PO TABS
20.0000 mg | ORAL_TABLET | Freq: Every day | ORAL | Status: DC
  Administered 2023-05-26 – 2023-05-27 (×2): 20 mg via ORAL
  Filled 2023-05-26 (×2): qty 2

## 2023-05-26 MED ORDER — UMECLIDINIUM BROMIDE 62.5 MCG/ACT IN AEPB
1.0000 | INHALATION_SPRAY | Freq: Every day | RESPIRATORY_TRACT | Status: DC
  Administered 2023-05-26 – 2023-05-27 (×2): 1 via RESPIRATORY_TRACT
  Filled 2023-05-26: qty 7

## 2023-05-26 MED ORDER — ACETAMINOPHEN 325 MG PO TABS
650.0000 mg | ORAL_TABLET | ORAL | Status: DC | PRN
  Administered 2023-05-26 – 2023-05-27 (×3): 650 mg via ORAL
  Filled 2023-05-26 (×2): qty 2

## 2023-05-26 MED ORDER — KETOROLAC TROMETHAMINE 15 MG/ML IJ SOLN
15.0000 mg | Freq: Once | INTRAMUSCULAR | Status: AC
  Administered 2023-05-26: 15 mg via INTRAVENOUS
  Filled 2023-05-26: qty 1

## 2023-05-26 MED ORDER — ACETAMINOPHEN 325 MG PO TABS
ORAL_TABLET | ORAL | Status: AC
  Filled 2023-05-26: qty 2

## 2023-05-26 MED ORDER — PHENOBARBITAL 32.4 MG PO TABS
64.8000 mg | ORAL_TABLET | Freq: Three times a day (TID) | ORAL | Status: AC
  Administered 2023-05-26 – 2023-05-27 (×3): 64.8 mg via ORAL
  Filled 2023-05-26 (×3): qty 2

## 2023-05-26 MED ORDER — PANTOPRAZOLE SODIUM 40 MG PO TBEC
40.0000 mg | DELAYED_RELEASE_TABLET | Freq: Every day | ORAL | Status: DC
Start: 2023-05-26 — End: 2023-05-28
  Administered 2023-05-26 – 2023-05-27 (×2): 40 mg via ORAL
  Filled 2023-05-26 (×2): qty 1

## 2023-05-26 MED ORDER — FLUTICASONE FUROATE-VILANTEROL 200-25 MCG/ACT IN AEPB
1.0000 | INHALATION_SPRAY | Freq: Every day | RESPIRATORY_TRACT | Status: DC
  Administered 2023-05-26 – 2023-05-27 (×2): 1 via RESPIRATORY_TRACT
  Filled 2023-05-26: qty 28

## 2023-05-26 MED ORDER — PHENOBARBITAL 32.4 MG PO TABS
64.8000 mg | ORAL_TABLET | Freq: Every day | ORAL | Status: DC
  Administered 2023-05-27: 64.8 mg via ORAL
  Filled 2023-05-26: qty 2

## 2023-05-26 NOTE — Progress Notes (Signed)
 NAME:  KRYSTAL DELDUCA, MRN:  [id number], DOB:  1950-12-04, LOS: 1 ADMISSION DATE:  05/25/2023, CONSULTATION DATE:  05/25/23 REFERRING MD:  Melvenia, EDP, CHIEF COMPLAINT:  respiratory arrest   History of Present Illness:  73 year old male with past medical history of hyperlipidemia, GERD, asthma, alcohol use who presented to the emergency department with respiratory arrest and unresponsiveness.  Per ED provider, significant other found him on the floor unresponsive about 30 minutes prior to arrival to the emergency department after a fall.  He required BVM assistance by EMS.  Additionally, significant other notes that he had a syncopal episode yesterday, had some coughing today. She states these episodes seemed to happen after he coughs, feels mildly short of breath like his asthma is starting up and then falls to the floor.    On my interview, significant other is at the bedside. She says he has otherwise been in his normal state of health. She denies him complaining of chest pain, shortness of breath, fever, n/v/d. No recent long flights or drives. No complaints of headache, nausea, visual changes, weakness. She notes yesterday he got out of the car, began coughing and lowered himself to the ground. States he felt like he had passed out but maybe not. Additionally, today, she states he was clearing his throat at the sink, and then called out for her. She states he was laying on the ground stating he felt like he was going to die. States he does drink alcohol daily, about 4 shots of liquor and a few beers. She denies him every having alcohol withdrawals. States his asthma is under control with his daily maintenance inhalers.   Pertinent  Medical History  Hyperlipidemia, GERD, asthma, alcohol use   Significant Hospital Events: Including procedures, antibiotic start and stop dates in addition to other pertinent events   2/3: Admit to CCM  Interim History / Subjective:  Extubated yesterday. Ethanol  withdrawal symptoms yesterday afternoon and started on phenobarb.   Pt feeling well this morning. Reports improvement in the withdrawal symptoms that arose yesterday. Reports no pain or distress. He is eating breakfast. EEG leads in place on head.  Objective   Blood pressure 110/85, pulse 72, temperature 98.8 F (37.1 C), temperature source Oral, resp. rate (!) 26, height 5' 11 (1.803 m), weight 84.6 kg, SpO2 93%.    Vent Mode: CPAP;PSV FiO2 (%):  [40 %] 40 % Set Rate:  [20 bmp] 20 bmp Vt Set:  [600 mL] 600 mL PEEP:  [5 cmH20] 5 cmH20 Pressure Support:  [10 cmH20] 10 cmH20 Plateau Pressure:  [21 cmH20] 21 cmH20   Intake/Output Summary (Last 24 hours) at 05/26/2023 9379 Last data filed at 05/26/2023 0400 Gross per 24 hour  Intake 642.52 ml  Output 575 ml  Net 67.52 ml   Filed Weights   05/25/23 0150 05/26/23 0500  Weight: 97 kg 84.6 kg    Examination: General: older male, upright in bed, no acute distress  HENT: [place]/AT Lungs: CTABL Cardiovascular: RRR no MRG Abdomen: Soft and non distended Extremities: no pitting edema Neuro: moves all extremities against gravity, AOx3  GU: foley  Resolved Hospital Problem list   AKI at presentation  Assessment & Plan:   Altered mental status (resolved) ?Syncope Odd presentation. Described as brief episodes of syncope vs lightheadedness after coughing/lifting/valsavla. Workup negative including CTA, Echo, DVT, Glucose, electrolytes. EtOH 169 at presentation. Possible vasovagal vs ethanol related?  - neurology involved, thank you              -  MRI brain              - cEEG              - high dose thiamine   - ammonia mildly elevated but pt is alert. TSH, B12, folate WNL. - Echo, EKG, trop, bnp, venous duplex to rule out PE all negative   Alcohol withdrawal Alcohol use  Withdrawal started afternoon of 2/3. I estimate last drink around midnight on 2/3, the day of presentation.  - Phenobarbital  taper with prn ativan  - on thiamine ,  folic acid , mtvn  - counsel on cessation   Acute respiratory failure 2/2 above and inability to protect airway  Resolving. Extubated. Now on 3L [place]   HLD  - can restart rosuvastatin  when clinically appropriate    GERD - h2b  Best Practice (right click and Reselect all SmartList Selections daily)   Diet/type: Regular DVT prophylaxis: prophylactic heparin   Pressure ulcer(s): na GI prophylaxis: H2B Lines: N/A Foley:  External urinary catheter Code Status:  full code Last date of multidisciplinary goals of care discussion []   Labs   CBC: Recent Labs  Lab 05/25/23 0207 05/25/23 0211 05/25/23 0251 05/25/23 0421 05/26/23 0416  WBC 9.6  --   --  6.7 9.2  NEUTROABS 5.5  --   --   --   --   HGB 15.4 14.6 13.6 13.3 13.5  HCT 46.1 43.0 40.0 39.7 39.4  MCV 107.7*  --   --  109.1* 107.7*  PLT 198  --   --  176 157    Basic Metabolic Panel: Recent Labs  Lab 05/25/23 0207 05/25/23 0211 05/25/23 0251 05/25/23 0421 05/26/23 0416  NA 140 140 137 140 140  K 4.7 4.6 3.9 3.3* 4.2  CL 104 105  --  104 104  CO2 22  --   --  21* 26  GLUCOSE 99 102*  --  110* 98  BUN 17 21  --  16 13  CREATININE 1.46* 1.70*  --  1.34* 1.11  CALCIUM  9.5  --   --  8.8* 8.9  MG  --   --   --  1.8  --    GFR: Estimated Creatinine Clearance: 64.1 mL/min (by C-G formula based on SCr of 1.11 mg/dL). Recent Labs  Lab 05/25/23 0207 05/25/23 0421 05/26/23 0416  WBC 9.6 6.7 9.2    Liver Function Tests: Recent Labs  Lab 05/25/23 0207  AST 50*  ALT 36  ALKPHOS 46  BILITOT 1.1  PROT 6.5  ALBUMIN 3.8   No results for input(s): LIPASE, AMYLASE in the last 168 hours. Recent Labs  Lab 05/25/23 0421  AMMONIA 56*    ABG    Component Value Date/Time   PHART 7.332 (L) 05/25/2023 0251   PCO2ART 42.8 05/25/2023 0251   PO2ART 339 (H) 05/25/2023 0251   HCO3 22.8 05/25/2023 0251   TCO2 24 05/25/2023 0251   ACIDBASEDEF 3.0 (H) 05/25/2023 0251   O2SAT 100 05/25/2023 0251      Coagulation Profile: Recent Labs  Lab 05/25/23 0421  INR 1.1    Cardiac Enzymes: No results for input(s): CKTOTAL, CKMB, CKMBINDEX, TROPONINI in the last 168 hours.  HbA1C: Hgb A1c MFr Bld  Date/Time Value Ref Range Status  05/25/2023 04:21 AM 5.2 4.8 - 5.6 % Final    Comment:    (NOTE) Pre diabetes:          5.7%-6.4%  Diabetes:              >  6.4%  Glycemic control for   <7.0% adults with diabetes     CBG: Recent Labs  Lab 05/25/23 1122 05/25/23 1535 05/25/23 1946 05/25/23 2349 05/26/23 0354  GLUCAP 101* 135* 122* 106* 112*    Review of Systems:   Negative per above  Past Medical History:  He,  has a past medical history of Arthritis, Asthma, GERD (gastroesophageal reflux disease), History of kidney stones, Hyperlipidemia, Renal disorder, and Varicose veins.   Surgical History:   Past Surgical History:  Procedure Laterality Date   COLONOSCOPY WITH PROPOFOL  N/A 03/20/2015   Procedure: COLONOSCOPY WITH PROPOFOL ;  Surgeon: Gladis MARLA Louder, MD;  Location: [facility];  Service: Endoscopy;  Laterality: N/A;   HERNIA REPAIR     inguinal hernia   TONSILLECTOMY     VARICOSE VEIN SURGERY Bilateral    2 years ago   VASECTOMY       Social History:   reports that he quit smoking about 10 years ago. His smoking use included cigarettes. He started smoking about 30 years ago. He has a 10 pack-year smoking history. He has never used smokeless tobacco. He reports current alcohol use. He reports that he does not use drugs.   Family History:  His family history includes COPD in his father; Dementia in his mother.   Allergies Allergies  Allergen Reactions   Latex Hives and Rash   Morphine And Codeine Hives     Home Medications  Prior to Admission medications   Medication Sig Start Date End Date Taking? Authorizing Provider  ALPRAZolam (XANAX) 0.5 MG tablet Take 0.5 mg by mouth daily as needed for anxiety.   Yes [provider]   Budeson-Glycopyrrol-Formoterol  (BREZTRI AEROSPHERE) 160-9-4.8 MCG/ACT AERO Inhale 1 puff into the lungs daily.   Yes [provider]  escitalopram  (LEXAPRO ) 20 MG tablet Take 20 mg by mouth daily.   Yes [provider]  omeprazole (PRILOSEC) 20 MG capsule Take 20 mg by mouth daily.   Yes [provider]  tamsulosin  (FLOMAX ) 0.4 MG CAPS capsule Take 0.4 mg by mouth at bedtime.   Yes [provider]  traMADol (ULTRAM) 50 MG tablet Take 50 mg by mouth daily as needed for moderate pain (pain score 4-6).   Yes [provider]  rosuvastatin  (CRESTOR ) 40 MG tablet Take 40 mg by mouth daily. Patient not taking: Reported on 05/25/2023    [provider]     Critical care time: 35 mins

## 2023-05-26 NOTE — TOC Initial Note (Signed)
 Transition of Care [facility]) - Initial/Assessment Note    Patient Details  Name: Ralph [name] MRN: [id number] Date of Birth: 12-16-1950  [facility] CM/SW Contact:    Lauraine FORBES Saa, LCSW Phone Number: 05/26/2023, 12:56 PM  Clinical Narrative:                  12:56 PM Per progressions, patient has history of alcohol use and is no longer intubated.    Barriers to Discharge: Continued Medical Work up   Patient Goals and CMS Choice            Expected Discharge Plan and Services In-house Referral: Clinical Social Work     Living arrangements for the past 2 months: Single Family Home                                      Prior Living Arrangements/Services Living arrangements for the past 2 months: Single Family Home Lives with:: Significant Other Patient language and need for interpreter reviewed:: Yes        Need for Family Participation in Patient Care: Yes (Comment) Care giver support system in place?: Yes (comment)   Criminal Activity/Legal Involvement Pertinent to Current Situation/Hospitalization: No - Comment as needed  Activities of Daily Living      Permission Sought/Granted Permission sought to share information with : Family Supports Permission granted to share information with : No (Contact information on chart)  Share Information with NAME: Nathanel Servant     Permission granted to share info w Relationship: Significant Other  Permission granted to share info w Contact Information: [phone]  Emotional Assessment       Orientation: : Oriented to Self, Oriented to Place, Oriented to  Time, Oriented to Situation Alcohol / Substance Use: Alcohol Use Psych Involvement: No (comment)  Admission diagnosis:  Respiratory arrest (HCC) [R09.2] Unresponsiveness [R41.89] Patient Active Problem List   Diagnosis Date Noted   Respiratory arrest (HCC) 05/25/2023   Abnormal CT of the chest 09/14/2016   COPD GOLD III  09/12/2016    DOE (dyspnea on exertion) 01/18/2016   Excessive daytime sleepiness 01/18/2016   Essential hypertension 01/18/2016   PCP:  Cleotilde Planas, MD Pharmacy:   [facility] [phone] - [place], [place] - [address] AT [facility] OF [address] & [facility] [address] [place] [address] Phone: [phone] Fax: [phone]  [facility] - [place], [place] - [address] [place] [address] Phone: [phone] Fax: [phone]  [facility] #[id number] - [place], [place] - [address] AT [facility] OF [address] & [address] [place] [address] Phone: [phone] Fax: [phone]     Social Drivers of Health (SDOH) Social History: SDOH Screenings   Food Insecurity: Patient Unable To Answer (05/25/2023)  Housing: Patient Unable To Answer (05/25/2023)  Transportation Needs: Patient Unable To Answer (05/25/2023)  Utilities: Patient Unable To Answer (05/25/2023)  Social Connections: Patient Unable To Answer (05/25/2023)  Tobacco Use: Low Risk  (10/08/2022)   Received from [facility]   SDOH Interventions:     Readmission Risk Interventions     No data to display

## 2023-05-26 NOTE — Progress Notes (Signed)
Pt transferred to 72M 13. Bed alarm on. Call bell within reach. Pt brought his cell phone with him to new room and was using it when I left.

## 2023-05-26 NOTE — Progress Notes (Signed)
 Pt bathed. Gown changed. Full linen changed. Pt transferred with standby assist x1 to the chair. Chair alarm on. Call bell within reach. Instructed pt to press call bell for any needs and to not attempt to independently get up. Spouse in room and understood instructions as well.

## 2023-05-26 NOTE — Progress Notes (Signed)
 NEUROLOGY CONSULT FOLLOW UP NOTE   Date of service: May 26, 2023 Patient Name: Ralph [name] MRN:  [id number] DOB:  [birth year]  Brief review of HPI:  73 y.o. male with hx of Asthma, GERD, nephrolithiasis, HLD, who apparently had a fall at home. He had a bout of cough and felt like something was stuck in his throat. Was coughing a lot in an attempt to clear his throat and wife left the room to do something else. HE called out for significant other after he had fallen and asked her to call EMS. He was moving everything and talking to her. When EMS got there, he was less responsive. He was being bagged by EMS. He would wiggle his toes and move his hands and fingers spontaneously. Significant other reports that yesterday, he was not feeling well. Has asthma and was short of breath and helped himself down on the grass and passed out after he moved some stuff from his car trunk. Does drink Vodka, about 12-24 Oz a day. Patient does not think that is too much alcohol. No prior hx of seizures, no family hx of seizures, no hx of strokes or ICH. In the ED, he was intubated for poor mentation. Post intubation, vigorously moving around and uncomfortable. Neurology consulted for evaluation for poor responsiveness. CT Head w/o contrast with no acute abnormalities. CTA with no LVO, specifically no basilar occlusion  Interval Hx/subjective  Feeling back to baseline today. Complains of left foot pain. Provides new history, stating that he typically drinks for 3 weeks out of the month and is abstinent on the 4th week. He states that he was coming off his week of abstinence, having just drank his first cocktail when the above episode occurred.  Vitals   Vitals:   05/26/23 0407 05/26/23 0500 05/26/23 0600 05/26/23 0706  BP:  125/77 (!) 158/92   Pulse: 72 72 72   Resp: (!) 26 (!) 22 (!) 21   Temp: 98.8 F (37.1 C)   98.4 F (36.9 C)  TempSrc: Oral   Axillary  SpO2: 93% (!) 88% 94%   Weight:  84.6 kg     Height:         Body mass index is 26.01 kg/m.  Physical Exam   Physical Exam  HEENT:  [place]/AT  Lungs: Respirations unlabored Extremities: Warm and well perfused.   Neurological Examination Mental Status: Awake and alert. Thought that it might be Sunday; otherwise fully oriented. Speech fluent with intact naming and comprehension.  Cranial Nerves: II, III,IV, VI: Fixates and tracks normally. No ptosis. EOMI. No nystagmus.  VII: Smile symmetric VIII: Hearing intact to conversation IX,X: No hoarseness or hypophonia XI: Symmetric XII: Midline tongue extension Motor: Right : Upper extremity   5/5    Left:     Upper extremity   5/5  Lower extremity   5/5     Lower extremity   5/5 No pronator drift Sensory: Temp and light touch intact throughout, bilaterally Deep Tendon Reflexes: 2+ and symmetric throughout, except for 4+ left patellar (crossed adductor response) Cerebellar: No ataxia with FNF bilaterally. Fine action tremor noted.  Gait: Deferred  Medications  Current Facility-Administered Medications:    0.9 %  sodium chloride  infusion, , Intravenous, PRN, Claudene Toribio JAYSON, MD, Last Rate: 10 mL/hr at 05/26/23 0600, Infusion Verify at 05/26/23 0600   arformoterol  (BROVANA ) nebulizer solution 15 mcg, 15 mcg, Nebulization, BID, Claudene Toribio JAYSON, MD, 15 mcg at 05/25/23 2134   Chlorhexidine  Gluconate Cloth 2 %  PADS 6 each, 6 each, Topical, Daily, Claudene Toribio BROCKS, MD, 6 each at 05/25/23 1000   dexmedetomidine  (PRECEDEX ) 400 MCG/100ML (4 mcg/mL) infusion, 0-1.2 mcg/kg/hr, Intravenous, Titrated, Adolph Tinnie BRAVO, PA-C, Stopped at 05/25/23 9073   docusate sodium  (COLACE) capsule 100 mg, 100 mg, Oral, Daily, Claudene Toribio BROCKS, MD   doxycycline  (VIBRA -TABS) tablet 100 mg, 100 mg, Oral, Q12H, Claudene Toribio BROCKS, MD, 100 mg at 05/25/23 2147   famotidine  (PEPCID ) tablet 20 mg, 20 mg, Oral, BID, Claudene Toribio BROCKS, MD, 20 mg at 05/25/23 2147   folic acid  (FOLVITE ) tablet 1 mg, 1 mg, Oral, Daily, Claudene Toribio BROCKS, MD, 1 mg at 05/25/23 1152   heparin  injection 5,000 Units, 5,000 Units, Subcutaneous, Q8H, Autry, Lauren E, PA-C, 5,000 Units at 05/26/23 9493   insulin  aspart (novoLOG ) injection 0-9 Units, 0-9 Units, Subcutaneous, Q4H, Autry, Lauren E, PA-C, 1 Units at 05/25/23 1959   LORazepam  (ATIVAN ) injection 1 mg, 1 mg, Intravenous, Q4H PRN, Claudene Toribio BROCKS, MD, 1 mg at 05/25/23 1959   multivitamin with minerals tablet 1 tablet, 1 tablet, Oral, Daily, Claudene Toribio BROCKS, MD, 1 tablet at 05/25/23 1151   Oral care mouth rinse, 15 mL, Mouth Rinse, PRN, Claudene Toribio BROCKS, MD   PHENObarbital  (LUMINAL) injection 130 mg, 130 mg, Intravenous, TID, 130 mg at 05/25/23 2147 **FOLLOWED BY** PHENObarbital  (LUMINAL) injection 65 mg, 65 mg, Intravenous, TID **FOLLOWED BY** [START ON 05/27/2023] PHENObarbital  (LUMINAL) injection 65 mg, 65 mg, Intravenous, QHS, Claudene Toribio BROCKS, MD   polyethylene glycol (MIRALAX  / GLYCOLAX ) packet 17 g, 17 g, Per Tube, Daily PRN, Adolph, Lauren E, PA-C   polyethylene glycol (MIRALAX  / GLYCOLAX ) packet 17 g, 17 g, Oral, Daily, Claudene Toribio BROCKS, MD   revefenacin  (YUPELRI ) nebulizer solution 175 mcg, 175 mcg, Nebulization, Daily, Claudene Toribio BROCKS, MD, 175 mcg at 05/25/23 1638   thiamine  (VITAMIN B1) 500 mg in sodium chloride  0.9 % 50 mL IVPB, 500 mg, Intravenous, Q8H, Stopped at 05/26/23 0538 **FOLLOWED BY** [START ON 05/27/2023] thiamine  (VITAMIN B1) 250 mg in sodium chloride  0.9 % 50 mL IVPB, 250 mg, Intravenous, Daily **FOLLOWED BY** [START ON 06/02/2023] thiamine  (VITAMIN B1) injection 100 mg, 100 mg, Intravenous, Daily, Khaliqdina, Salman, MD  Labs and Diagnostic Imaging   CBC:  Recent Labs  Lab 05/25/23 0207 05/25/23 0211 05/25/23 0421 05/26/23 0416  WBC 9.6  --  6.7 9.2  NEUTROABS 5.5  --   --   --   HGB 15.4   < > 13.3 13.5  HCT 46.1   < > 39.7 39.4  MCV 107.7*  --  109.1* 107.7*  PLT 198  --  176 157   < > = values in this interval not displayed.    Basic Metabolic Panel:  Lab  Results  Component Value Date   NA 140 05/26/2023   K 4.2 05/26/2023   CO2 26 05/26/2023   GLUCOSE 98 05/26/2023   BUN 13 05/26/2023   CREATININE 1.11 05/26/2023   CALCIUM  8.9 05/26/2023   GFRNONAA >60 05/26/2023   GFRAA 55 (L) 03/21/2011   Lipid Panel: No results found for: LDLCALC HgbA1c:  Lab Results  Component Value Date   HGBA1C 5.2 05/25/2023   Urine Drug Screen:     Component Value Date/Time   LABOPIA NONE DETECTED 05/25/2023 0246   COCAINSCRNUR NONE DETECTED 05/25/2023 0246   LABBENZ NONE DETECTED 05/25/2023 0246   AMPHETMU NONE DETECTED 05/25/2023 0246   THCU NONE DETECTED 05/25/2023 0246   LABBARB NONE DETECTED 05/25/2023  0246    Alcohol Level     Component Value Date/Time   ETH 169 (H) 05/25/2023 0207   INR  Lab Results  Component Value Date   INR 1.1 05/25/2023   APTT  Lab Results  Component Value Date   APTT 27 05/25/2023     Assessment  BEECHER FURIO is a 73 y.o. male with hx of Asthma, GERD, nephrolithiasis, HLD, alcohol use disorder who apparently had a fall at home and passed out following a bout of cough. Was talking to wife after the fall and then had a decline in mentation. On EMS arrival, pt was apneic; O2 sat 88%. BVM @ 100% - GCS 3/15. EtOH was 189 on arrival. He was not doing much for EMS and was intubated upon arrival to the ED. He was extubated Monday morning. He is currently on a phenobarbital  taper for EtOH withdrawal.  - Exam today reveals normal cognition and strength x 4. He is back to his baseline.  - MRI brain is pending - LTM EEG report for today: This study is within normal limits. No seizures or epileptiform discharges were seen throughout the recording. A normal interictal EEG does not exclude the diagnosis of epilepsy. - Labs: - Ammonia elevated at 56.  - TSH normal - B12 and folate are normal  - DDx: - Provides new history, stating that he typically drinks for 3 weeks out of the month and is abstinent on the 4th week.  He states that he was coming off his week of abstinence, having just drank his first cocktail when the above episode occurred. EtOH withdrawal seizure with postictal state is now relatively high on the DDx. On the other hand, wife denies any noted seizure like activity or hx of seizure, and none noted by EMS.  - No basilar artery thrombosis noted on CTA, but ?artery of percheron stroke. MRI is pending.   - Hyperammonemia may have played a role in his decompensation - No fever or other clear signs of meningitis. - A pulmonary etiology with vasovagal syncope is possible, given his asthma and episode of SOB the day prior admission, during which he had helped himself down on the grass and passed out after he moved some stuff from his car trunk which involved some heavy lifting. Also had a similar event 2 years ago. Last week preceded by lifting heavy object, this time maybe precipitated by coughing   Recommendations  - MRI brain is pending - Discontinuing LTM EEG - Thiamine  high dose replacement. - Continue phenobarbital  taper for EtOH withdrawal  ______________________________________________________________________   Bonney SHARK, Sadi Arave, MD [facility]

## 2023-05-26 NOTE — Plan of Care (Signed)
  Problem: Safety: Goal: Non-violent Restraint(s) Outcome: Progressing   Problem: Education: Goal: Ability to describe self-care measures that may prevent or decrease complications (Diabetes Survival Skills Education) will improve Outcome: Progressing Goal: Individualized Educational Video(s) Outcome: Progressing   Problem: Coping: Goal: Ability to adjust to condition or change in health will improve Outcome: Progressing   Problem: Fluid Volume: Goal: Ability to maintain a balanced intake and output will improve Outcome: Progressing   Problem: Health Behavior/Discharge Planning: Goal: Ability to identify and utilize available resources and services will improve Outcome: Progressing Goal: Ability to manage health-related needs will improve Outcome: Progressing   Problem: Metabolic: Goal: Ability to maintain appropriate glucose levels will improve Outcome: Progressing   Problem: Nutritional: Goal: Maintenance of adequate nutrition will improve Outcome: Progressing Goal: Progress toward achieving an optimal weight will improve Outcome: Progressing   Problem: Skin Integrity: Goal: Risk for impaired skin integrity will decrease Outcome: Progressing   Problem: Tissue Perfusion: Goal: Adequacy of tissue perfusion will improve Outcome: Progressing   Problem: Education: Goal: Knowledge of General Education information will improve Description: Including pain rating scale, medication(s)/side effects and non-pharmacologic comfort measures Outcome: Progressing   Problem: Health Behavior/Discharge Planning: Goal: Ability to manage health-related needs will improve Outcome: Progressing   Problem: Clinical Measurements: Goal: Ability to maintain clinical measurements within normal limits will improve Outcome: Progressing Goal: Will remain free from infection Outcome: Progressing Goal: Diagnostic test results will improve Outcome: Progressing Goal: Respiratory complications  will improve Outcome: Progressing Goal: Cardiovascular complication will be avoided Outcome: Progressing   Problem: Activity: Goal: Risk for activity intolerance will decrease Outcome: Progressing   Problem: Nutrition: Goal: Adequate nutrition will be maintained Outcome: Progressing   Problem: Coping: Goal: Level of anxiety will decrease Outcome: Progressing   Problem: Elimination: Goal: Will not experience complications related to bowel motility Outcome: Progressing Goal: Will not experience complications related to urinary retention Outcome: Progressing   Problem: Pain Managment: Goal: General experience of comfort will improve and/or be controlled Outcome: Progressing   Problem: Safety: Goal: Ability to remain free from injury will improve Outcome: Progressing   Problem: Skin Integrity: Goal: Risk for impaired skin integrity will decrease Outcome: Progressing

## 2023-05-26 NOTE — Progress Notes (Signed)
 LTM EEG discontinued - no skin breakdown at [facility].

## 2023-05-26 NOTE — Progress Notes (Signed)
 Pt arrived to unit on room air, no signs of distress. Pt alert and oriented x4, forgetful. Stand by assist to the bathroom. Pt has generalized bruising. Complains of L ankle pain, and was offered tylenol . Pt refused, but agreeable to one time dose of IV Toradol . Pt does not follow safety commands. RN instructed to pt to use call bell for assistance with ambulation, but pt gets up unassisted and walks to the bathroom. Bed alarm is on and call bell is in reach.  Patient belongings: Cell phone (pt states he has shoes and clothes but only arrived to 2 west with cell phone device).

## 2023-05-26 NOTE — Procedures (Addendum)
 Patient Name: ASHE GAGO  MRN: [id number]  Epilepsy Attending: Arlin MALVA Krebs  Referring Physician/Provider: Khaliqdina, Salman, MD  Duration: 05/25/2023 1121 to 05/26/2023 9072  Patient history: 73 y.o. male with hx of alcohol use disorder who apparently had a fall at home and passed out following a bout of cough. Was talking to wife after the fall and then had a decline in mentation and not doing much for EMS and intubated upon arrival to the ED. EEG to evaluate for seizure  Level of alertness: Awake, asleep  AEDs during EEG study: Phenobarb  Technical aspects: This EEG study was done with scalp electrodes positioned according to the 10-20 International system of electrode placement. Electrical activity was reviewed with band pass filter of 1-70Hz , sensitivity of 7 uV/mm, display speed of 90mm/sec with a 60Hz  notched filter applied as appropriate. EEG data were recorded continuously and digitally stored.  Video monitoring was available and reviewed as appropriate.  Description: The posterior dominant rhythm consists of 8.5 Hz activity of moderate voltage (25-35 uV) seen predominantly in posterior head regions, symmetric and reactive to eye opening and eye closing. Sleep was characterized by vertex waves, sleep spindles (12 to 14 Hz), maximal frontocentral region. Hyperventilation and photic stimulation were not performed.     IMPRESSION: This study is within normal limits. No seizures or epileptiform discharges were seen throughout the recording.  A normal interictal EEG does not exclude the diagnosis of epilepsy.  Merla Sawka O Hadeel Hillebrand

## 2023-05-26 NOTE — Progress Notes (Signed)
Spoke with MRI staff. Unable to schedule MRI at the time. Waiting on them to call me back.

## 2023-05-26 NOTE — Progress Notes (Signed)
Pt c/o of mild abdominal pain. States that he may need to have BM. Dr. Ninfa Meeker notified. Bowel regimen medications changed (see MAR).

## 2023-05-27 ENCOUNTER — Inpatient Hospital Stay (HOSPITAL_COMMUNITY): Payer: Medicare Other

## 2023-05-27 LAB — BLOOD GAS, VENOUS
Acid-Base Excess: 5.3 mmol/L — ABNORMAL HIGH (ref 0.0–2.0)
Bicarbonate: 29.9 mmol/L — ABNORMAL HIGH (ref 20.0–28.0)
O2 Saturation: 37.7 %
Patient temperature: 36.9
pCO2, Ven: 42 mm[Hg] — ABNORMAL LOW (ref 44–60)
pH, Ven: 7.46 — ABNORMAL HIGH (ref 7.25–7.43)
pO2, Ven: <31 mm[Hg] — CL (ref 32–45)

## 2023-05-27 LAB — CBC
HCT: 40.1 % (ref 39.0–52.0)
Hemoglobin: 14.2 g/dL (ref 13.0–17.0)
MCH: 36.7 pg — ABNORMAL HIGH (ref 26.0–34.0)
MCHC: 35.4 g/dL (ref 30.0–36.0)
MCV: 103.6 fL — ABNORMAL HIGH (ref 80.0–100.0)
Platelets: 163 10*3/uL (ref 150–400)
RBC: 3.87 MIL/uL — ABNORMAL LOW (ref 4.22–5.81)
RDW: 12 % (ref 11.5–15.5)
WBC: 8.9 10*3/uL (ref 4.0–10.5)
nRBC: 0 % (ref 0.0–0.2)

## 2023-05-27 LAB — BASIC METABOLIC PANEL
Anion gap: 12 (ref 5–15)
BUN: 16 mg/dL (ref 8–23)
CO2: 24 mmol/L (ref 22–32)
Calcium: 9.4 mg/dL (ref 8.9–10.3)
Chloride: 104 mmol/L (ref 98–111)
Creatinine, Ser: 1.03 mg/dL (ref 0.61–1.24)
GFR, Estimated: >60 mL/min (ref >60)
Glucose, Bld: 106 mg/dL — ABNORMAL HIGH (ref 70–99)
Potassium: 3.6 mmol/L (ref 3.5–5.1)
Sodium: 140 mmol/L (ref 135–145)

## 2023-05-27 LAB — GLUCOSE, CAPILLARY
Glucose-Capillary: 102 mg/dL — ABNORMAL HIGH (ref 70–99)
Glucose-Capillary: 103 mg/dL — ABNORMAL HIGH (ref 70–99)
Glucose-Capillary: 104 mg/dL — ABNORMAL HIGH (ref 70–99)
Glucose-Capillary: 83 mg/dL (ref 70–99)
Glucose-Capillary: 98 mg/dL (ref 70–99)
Glucose-Capillary: 98 mg/dL (ref 70–99)

## 2023-05-27 MED ORDER — HYDRALAZINE HCL 20 MG/ML IJ SOLN
5.0000 mg | INTRAMUSCULAR | Status: AC | PRN
  Administered 2023-05-27: 5 mg via INTRAVENOUS
  Administered 2023-05-27 – 2023-05-30 (×5): 10 mg via INTRAVENOUS
  Administered 2023-05-30 – 2023-05-31 (×3): 5 mg via INTRAVENOUS
  Administered 2023-06-01: 10 mg via INTRAVENOUS
  Filled 2023-05-27 (×10): qty 1

## 2023-05-27 MED ORDER — LORAZEPAM 2 MG/ML IJ SOLN
1.0000 mg | INTRAMUSCULAR | Status: DC | PRN
  Administered 2023-05-27: 4 mg via INTRAVENOUS
  Administered 2023-05-27 – 2023-05-28 (×3): 2 mg via INTRAVENOUS
  Filled 2023-05-27: qty 2
  Filled 2023-05-27 (×4): qty 1
  Filled 2023-05-27: qty 2

## 2023-05-27 MED ORDER — LORAZEPAM 2 MG/ML IJ SOLN: 0.0000 mg | Freq: Three times a day (TID) | INTRAMUSCULAR | Status: DC

## 2023-05-27 MED ORDER — ALBUTEROL SULFATE (2.5 MG/3ML) 0.083% IN NEBU
2.5000 mg | INHALATION_SOLUTION | Freq: Four times a day (QID) | RESPIRATORY_TRACT | Status: DC | PRN
  Administered 2023-05-27 – 2023-06-03 (×3): 2.5 mg via RESPIRATORY_TRACT
  Filled 2023-05-27 (×3): qty 3

## 2023-05-27 MED ORDER — LORAZEPAM 1 MG PO TABS: 1.0000 mg | ORAL_TABLET | ORAL | Status: DC | PRN

## 2023-05-27 MED ORDER — LORAZEPAM 2 MG/ML IJ SOLN
0.0000 mg | INTRAMUSCULAR | Status: DC
  Administered 2023-05-27: 2 mg via INTRAVENOUS
  Administered 2023-05-28: 4 mg via INTRAVENOUS
  Administered 2023-05-28: 2 mg via INTRAVENOUS
  Filled 2023-05-27 (×2): qty 1
  Filled 2023-05-27: qty 2

## 2023-05-27 MED ORDER — LORAZEPAM 2 MG/ML IJ SOLN
2.0000 mg | Freq: Once | INTRAMUSCULAR | Status: AC
Start: 2023-05-27 — End: 2023-05-27
  Administered 2023-05-27: 2 mg via INTRAVENOUS
  Filled 2023-05-27: qty 1

## 2023-05-27 NOTE — Progress Notes (Signed)
Patient refused MRI. 

## 2023-05-27 NOTE — Plan of Care (Signed)
  Problem: Safety: Goal: Non-violent Restraint(s) Outcome: Progressing   Problem: Education: Goal: Ability to describe self-care measures that may prevent or decrease complications (Diabetes Survival Skills Education) will improve Outcome: Progressing Goal: Individualized Educational Video(s) Outcome: Progressing   Problem: Coping: Goal: Ability to adjust to condition or change in health will improve Outcome: Progressing   Problem: Fluid Volume: Goal: Ability to maintain a balanced intake and output will improve Outcome: Progressing   Problem: Health Behavior/Discharge Planning: Goal: Ability to identify and utilize available resources and services will improve Outcome: Progressing Goal: Ability to manage health-related needs will improve Outcome: Progressing

## 2023-05-27 NOTE — Progress Notes (Signed)
 Informed by pts daughter and  pts spouse that pt had a blue IPhone present during hospitalization . Upon doing a room search , checking under bed and mattress and all cabinets that included pt belongings phone not found. Called pts prior room in ICU, no phone found as well as MRI which pt traveled to today in am.  pt belongings listed stated cell phone . Phone called and goes straight to VM.

## 2023-05-27 NOTE — Progress Notes (Signed)
 Notified Critical Care On Call RN for patients elevated BP. Assigned RN informed that the MD will be notified and some PRN's would be ordered. Will continue to monitor this shift.

## 2023-05-27 NOTE — Progress Notes (Signed)
Pt off unit  going to MRI

## 2023-05-27 NOTE — Progress Notes (Signed)
 Pt back on unit from MRI VSS

## 2023-05-27 NOTE — Progress Notes (Signed)
 Pts cellphone was found under linen bag . Spouse present at bedside when Iphone found

## 2023-05-27 NOTE — Progress Notes (Addendum)
 Patient was seen for agitation and confusion. He appears to have worsening alcohol withdrawal despite phenobarbital  taper and a dose of Ativan . Plan to give additional Ativan  now, monitor with CIWA scoring, and continue Ativan  q4h and PRN. He was placed in soft restraints for safety of the patient and staff; these will be removed as soon as it is safe to do so.    Addendum (04:55): Patient has been receiving IV Ativan  hourly but continues to have CIWA scores >20. Appreciate Dr. Layman of PCCM agreeing to evaluate patient.

## 2023-05-27 NOTE — Progress Notes (Signed)
  Progress Note   Patient: Ralph [name] FMW:[id number] DOB: Feb 20, 1951 DOA: 05/25/2023     2 DOS: the patient was seen and examined on 05/27/2023   Brief hospital course: No notes on file 73 year old male with alcohol use, asthma, GERD, HLD who was found unresponsive by spouse who reported syncopal episode and some coughing fits otherwise normal health. EMS called and patient in respiratory arrest requiring BVM. He required intubation but was extubated once arrived to the ICU immediately. Did begin having withdrawal symptoms and started on phenobarb taper.  Now discharged from ICU to [facility].   Assessment and Plan: No notes have been filed under this hospital service. Service: Hospitalist  Acute hypoxic respiratory failure -Intubated with subsequent extubation in ICU shortly after admission.  Currently breathing comfortably on room air.  Appears to be resolved at this time.    Syncope -Likely related to alcohol w/d or vasovagal etiology.  Evaluated by neurology, noting negative CTA and cspine.  Neg EEG.  MRI pending.  Pt has had no further syncopal episodes.    Alcohol withdrawal -Pt currently on phenobarbitol taper, however pt does not have desire to stop alcohol completely.  Appears to currently be controlled on said taper.  Discussed with the patient about possibly weaning his alcohol intake so as not to have withdrawal symptoms.   Asthma -No wheezing appreciated on exam.  Continue current inhaler/neb.    Physical debilitation and muscle weakness -Pt notes continued weakness, confirmed by nursing when attempting to ambulate the patient.  Will consult PT.        Subjective: Pt states he is feeling improved today.  Still weak, but denies fever, chills, shaking, shortness of breath, nausea, vomiting, diarrhea.    Physical Exam: Vitals:   05/27/23 0055 05/27/23 0218 05/27/23 0527 05/27/23 0902  BP: (!) 154/103 (!) 157/104 (!) 158/90 (!) 156/90  Pulse: 68  67 69  Resp:   18 18  Temp:    98.2 F (36.8 C) (!) 97.5 F (36.4 C)  TempSrc:   Oral Oral  SpO2:  99% 97% 97%  Weight:      Height:       Physical Exam GENERAL:  Alert, pleasant, no acute distress  HEENT:  EOMI CARDIOVASCULAR:  RRR, no murmurs appreciated RESPIRATORY:  Clear to auscultation, no wheezing, rales, or rhonchi GASTROINTESTINAL:  Soft, nontender, nondistended EXTREMITIES:  No LE edema bilaterally NEURO:  No new focal deficits appreciated SKIN:  No rashes noted PSYCH:  Appropriate mood and affect   Data Reviewed:  Results are pending, will review when available.  Family Communication:   Disposition: Status is: Inpatient Remains inpatient appropriate because: Continued phenobarbital  taper  Planned Discharge Destination: Home and Home with Home Health    Time spent: 30 minutes  Author: Carliss LELON Canales, DO 05/27/2023 11:54 AM  For on call review [email or web address].

## 2023-05-27 NOTE — Plan of Care (Signed)
  Problem: Safety: Goal: Non-violent Restraint(s) Outcome: Progressing   Problem: Education: Goal: Ability to describe self-care measures that may prevent or decrease complications (Diabetes Survival Skills Education) will improve Outcome: Progressing Goal: Individualized Educational Video(s) Outcome: Progressing   Problem: Coping: Goal: Ability to adjust to condition or change in health will improve Outcome: Progressing   Problem: Fluid Volume: Goal: Ability to maintain a balanced intake and output will improve Outcome: Progressing

## 2023-05-28 ENCOUNTER — Inpatient Hospital Stay (HOSPITAL_COMMUNITY): Payer: Medicare Other

## 2023-05-28 DIAGNOSIS — R55 Syncope and collapse: Secondary | ICD-10-CM | POA: Diagnosis not present

## 2023-05-28 DIAGNOSIS — J45909 Unspecified asthma, uncomplicated: Secondary | ICD-10-CM

## 2023-05-28 DIAGNOSIS — F10939 Alcohol use, unspecified with withdrawal, unspecified: Secondary | ICD-10-CM | POA: Diagnosis not present

## 2023-05-28 LAB — CBC WITH DIFFERENTIAL/PLATELET
Abs Immature Granulocytes: 0.05 10*3/uL (ref 0.00–0.07)
Basophils Absolute: 0 10*3/uL (ref 0.0–0.1)
Basophils Relative: 0 %
Eosinophils Absolute: 0 10*3/uL (ref 0.0–0.5)
Eosinophils Relative: 0 %
HCT: 40.8 % (ref 39.0–52.0)
Hemoglobin: 14.5 g/dL (ref 13.0–17.0)
Immature Granulocytes: 0 %
Lymphocytes Relative: 7 %
Lymphs Abs: 0.8 10*3/uL (ref 0.7–4.0)
MCH: 36.7 pg — ABNORMAL HIGH (ref 26.0–34.0)
MCHC: 35.5 g/dL (ref 30.0–36.0)
MCV: 103.3 fL — ABNORMAL HIGH (ref 80.0–100.0)
Monocytes Absolute: 1.3 10*3/uL — ABNORMAL HIGH (ref 0.1–1.0)
Monocytes Relative: 12 %
Neutro Abs: 9 10*3/uL — ABNORMAL HIGH (ref 1.7–7.7)
Neutrophils Relative %: 81 %
Platelets: 179 10*3/uL (ref 150–400)
RBC: 3.95 MIL/uL — ABNORMAL LOW (ref 4.22–5.81)
RDW: 12.3 % (ref 11.5–15.5)
WBC: 11.1 10*3/uL — ABNORMAL HIGH (ref 4.0–10.5)
nRBC: 0 % (ref 0.0–0.2)

## 2023-05-28 LAB — POCT I-STAT 7, (LYTES, BLD GAS, ICA,H+H)
Acid-Base Excess: 1 mmol/L (ref 0.0–2.0)
Bicarbonate: 24.4 mmol/L (ref 20.0–28.0)
Calcium, Ion: 1.23 mmol/L (ref 1.15–1.40)
HCT: 38 % — ABNORMAL LOW (ref 39.0–52.0)
Hemoglobin: 12.9 g/dL — ABNORMAL LOW (ref 13.0–17.0)
O2 Saturation: 100 %
Patient temperature: 99
Potassium: 3.9 mmol/L (ref 3.5–5.1)
Sodium: 139 mmol/L (ref 135–145)
TCO2: 26 mmol/L (ref 22–32)
pCO2 arterial: 36.5 mm[Hg] (ref 32–48)
pH, Arterial: 7.434 (ref 7.35–7.45)
pO2, Arterial: 410 mm[Hg] — ABNORMAL HIGH (ref 83–108)

## 2023-05-28 LAB — BASIC METABOLIC PANEL WITH GFR
Anion gap: 14 (ref 5–15)
BUN: 15 mg/dL (ref 8–23)
CO2: 22 mmol/L (ref 22–32)
Calcium: 9.5 mg/dL (ref 8.9–10.3)
Chloride: 104 mmol/L (ref 98–111)
Creatinine, Ser: 0.96 mg/dL (ref 0.61–1.24)
GFR, Estimated: >60 mL/min
Glucose, Bld: 106 mg/dL — ABNORMAL HIGH (ref 70–99)
Potassium: 3.7 mmol/L (ref 3.5–5.1)
Sodium: 140 mmol/L (ref 135–145)

## 2023-05-28 LAB — MAGNESIUM: Magnesium: 2 mg/dL (ref 1.7–2.4)

## 2023-05-28 LAB — GLUCOSE, CAPILLARY
Glucose-Capillary: 116 mg/dL — ABNORMAL HIGH (ref 70–99)
Glucose-Capillary: 114 mg/dL — ABNORMAL HIGH (ref 70–99)
Glucose-Capillary: 126 mg/dL — ABNORMAL HIGH (ref 70–99)
Glucose-Capillary: 120 mg/dL — ABNORMAL HIGH (ref 70–99)
Glucose-Capillary: 124 mg/dL — ABNORMAL HIGH (ref 70–99)
Glucose-Capillary: 126 mg/dL — ABNORMAL HIGH (ref 70–99)

## 2023-05-28 MED ORDER — FENTANYL CITRATE PF 50 MCG/ML IJ SOSY
PREFILLED_SYRINGE | INTRAMUSCULAR | Status: AC
  Filled 2023-05-28: qty 2

## 2023-05-28 MED ORDER — PHENOBARBITAL 32.4 MG PO TABS: 32.4000 mg | ORAL_TABLET | Freq: Three times a day (TID) | ORAL | Status: DC

## 2023-05-28 MED ORDER — FENTANYL CITRATE PF 50 MCG/ML IJ SOSY: 100.0000 ug | PREFILLED_SYRINGE | Freq: Once | INTRAMUSCULAR | Status: AC

## 2023-05-28 MED ORDER — FENTANYL CITRATE PF 50 MCG/ML IJ SOSY
PREFILLED_SYRINGE | INTRAMUSCULAR | Status: AC
  Administered 2023-05-28: 100 ug via INTRAVENOUS
  Filled 2023-05-28: qty 1

## 2023-05-28 MED ORDER — SENNOSIDES-DOCUSATE SODIUM 8.6-50 MG PO TABS
1.0000 | ORAL_TABLET | Freq: Two times a day (BID) | ORAL | Status: DC
  Administered 2023-05-28 – 2023-05-29 (×2): 1
  Filled 2023-05-28: qty 1

## 2023-05-28 MED ORDER — ORAL CARE MOUTH RINSE: 15.0000 mL | OROMUCOSAL | Status: DC

## 2023-05-28 MED ORDER — ROCURONIUM BROMIDE 10 MG/ML (PF) SYRINGE
PREFILLED_SYRINGE | INTRAVENOUS | Status: AC
  Administered 2023-05-28: 80 mg via INTRAVENOUS
  Filled 2023-05-28: qty 10

## 2023-05-28 MED ORDER — FAMOTIDINE 20 MG PO TABS
20.0000 mg | ORAL_TABLET | Freq: Two times a day (BID) | ORAL | Status: DC
  Administered 2023-05-28 – 2023-05-29 (×2): 20 mg
  Filled 2023-05-28 (×3): qty 1

## 2023-05-28 MED ORDER — ORAL CARE MOUTH RINSE: 15.0000 mL | OROMUCOSAL | Status: DC | PRN

## 2023-05-28 MED ORDER — PHENOBARBITAL 32.4 MG PO TABS: 64.8000 mg | ORAL_TABLET | Freq: Three times a day (TID) | ORAL | Status: DC

## 2023-05-28 MED ORDER — FENTANYL 2500MCG IN NS 250ML (10MCG/ML) PREMIX INFUSION
INTRAVENOUS | Status: AC
  Administered 2023-05-28: 25 ug/h via INTRAVENOUS
  Filled 2023-05-28: qty 250

## 2023-05-28 MED ORDER — PHENOBARBITAL 32.4 MG PO TABS: 97.2000 mg | ORAL_TABLET | Freq: Three times a day (TID) | ORAL | Status: DC

## 2023-05-28 MED ORDER — BUDESONIDE 0.5 MG/2ML IN SUSP
0.5000 mg | Freq: Two times a day (BID) | RESPIRATORY_TRACT | Status: DC
  Administered 2023-05-28 – 2023-06-09 (×17): 0.5 mg via RESPIRATORY_TRACT
  Filled 2023-05-28 (×21): qty 2

## 2023-05-28 MED ORDER — ORAL CARE MOUTH RINSE
15.0000 mL | OROMUCOSAL | Status: DC
  Administered 2023-05-28 – 2023-05-29 (×11): 15 mL via OROMUCOSAL

## 2023-05-28 MED ORDER — ROCURONIUM BROMIDE 10 MG/ML (PF) SYRINGE: 80.0000 mg | PREFILLED_SYRINGE | Freq: Once | INTRAVENOUS | Status: AC

## 2023-05-28 MED ORDER — FENTANYL 2500MCG IN NS 250ML (10MCG/ML) PREMIX INFUSION: 0.0000 ug/h | INTRAVENOUS | Status: DC

## 2023-05-28 MED ORDER — MIDAZOLAM HCL 2 MG/2ML IJ SOLN: 1.0000 mg | Freq: Once | INTRAMUSCULAR | Status: AC

## 2023-05-28 MED ORDER — POLYETHYLENE GLYCOL 3350 17 G PO PACK
17.0000 g | PACK | Freq: Two times a day (BID) | ORAL | Status: DC
  Administered 2023-05-28 – 2023-05-29 (×2): 17 g
  Filled 2023-05-28 (×2): qty 1

## 2023-05-28 MED ORDER — IMIPENEM-CILASTATIN 500 MG IV SOLR: 500.0000 mg | Freq: Two times a day (BID) | INTRAVENOUS | Status: DC

## 2023-05-28 MED ORDER — ETOMIDATE 2 MG/ML IV SOLN: 20.0000 mg | Freq: Once | INTRAVENOUS | Status: AC

## 2023-05-28 MED ORDER — FOLIC ACID 1 MG PO TABS
1.0000 mg | ORAL_TABLET | Freq: Every day | ORAL | Status: DC
  Administered 2023-05-28 – 2023-05-29 (×2): 1 mg
  Filled 2023-05-28 (×3): qty 1

## 2023-05-28 MED ORDER — ESCITALOPRAM OXALATE 10 MG PO TABS
20.0000 mg | ORAL_TABLET | Freq: Every day | ORAL | Status: DC
Start: 2023-05-29 — End: 2023-05-30
  Administered 2023-05-29: 20 mg
  Filled 2023-05-28 (×2): qty 2

## 2023-05-28 MED ORDER — REVEFENACIN 175 MCG/3ML IN SOLN
175.0000 ug | Freq: Every day | RESPIRATORY_TRACT | Status: DC
  Administered 2023-05-28 – 2023-06-09 (×11): 175 ug via RESPIRATORY_TRACT
  Filled 2023-05-28 (×12): qty 3

## 2023-05-28 MED ORDER — ETOMIDATE 2 MG/ML IV SOLN
INTRAVENOUS | Status: AC
  Administered 2023-05-28: 20 mg via INTRAVENOUS
  Filled 2023-05-28: qty 10

## 2023-05-28 MED ORDER — LORAZEPAM 2 MG/ML IJ SOLN
1.0000 mg | INTRAMUSCULAR | Status: DC | PRN
Start: 2023-05-28 — End: 2023-06-01
  Administered 2023-05-29 – 2023-06-01 (×8): 1 mg via INTRAVENOUS
  Filled 2023-05-28 (×9): qty 1

## 2023-05-28 MED ORDER — FAMOTIDINE IN NACL 20-0.9 MG/50ML-% IV SOLN
20.0000 mg | Freq: Two times a day (BID) | INTRAVENOUS | Status: DC
  Administered 2023-05-28: 20 mg via INTRAVENOUS
  Filled 2023-05-28: qty 50

## 2023-05-28 MED ORDER — PHENOBARBITAL SODIUM 65 MG/ML IJ SOLN
65.0000 mg | Freq: Three times a day (TID) | INTRAMUSCULAR | Status: DC
  Administered 2023-05-30 – 2023-05-31 (×4): 65 mg via INTRAVENOUS
  Filled 2023-05-28 (×4): qty 1

## 2023-05-28 MED ORDER — ADULT MULTIVITAMIN W/MINERALS CH
1.0000 | ORAL_TABLET | Freq: Every day | ORAL | Status: DC
  Administered 2023-05-29: 1
  Filled 2023-05-28 (×2): qty 1

## 2023-05-28 MED ORDER — PHENOBARBITAL SODIUM 65 MG/ML IJ SOLN: 32.4000 mg | Freq: Three times a day (TID) | INTRAMUSCULAR | Status: DC

## 2023-05-28 MED ORDER — MIDAZOLAM HCL 2 MG/2ML IJ SOLN
INTRAMUSCULAR | Status: AC
  Administered 2023-05-28: 1 mg via INTRAVENOUS
  Filled 2023-05-28: qty 2

## 2023-05-28 MED ORDER — ARFORMOTEROL TARTRATE 15 MCG/2ML IN NEBU
15.0000 ug | INHALATION_SOLUTION | Freq: Two times a day (BID) | RESPIRATORY_TRACT | Status: DC
  Administered 2023-05-28 – 2023-06-09 (×16): 15 ug via RESPIRATORY_TRACT
  Filled 2023-05-28 (×20): qty 2

## 2023-05-28 MED ORDER — ACETAMINOPHEN 325 MG PO TABS: 650.0000 mg | ORAL_TABLET | ORAL | Status: DC | PRN

## 2023-05-28 MED ORDER — PHENOBARBITAL SODIUM 130 MG/ML IJ SOLN
97.5000 mg | Freq: Three times a day (TID) | INTRAMUSCULAR | Status: AC
  Administered 2023-05-28 – 2023-05-29 (×6): 97.5 mg via INTRAVENOUS
  Filled 2023-05-28 (×6): qty 1

## 2023-05-28 MED ORDER — MIDAZOLAM HCL 2 MG/2ML IJ SOLN
1.0000 mg | Freq: Once | INTRAMUSCULAR | Status: AC
  Administered 2023-05-28: 1 mg via INTRAVENOUS

## 2023-05-28 MED ORDER — FAMOTIDINE 20 MG PO TABS: 20.0000 mg | ORAL_TABLET | Freq: Two times a day (BID) | ORAL | Status: DC

## 2023-05-28 MED ORDER — DEXMEDETOMIDINE HCL IN NACL 400 MCG/100ML IV SOLN
0.0000 ug/kg/h | INTRAVENOUS | Status: DC
  Administered 2023-05-28: 1 ug/kg/h via INTRAVENOUS
  Administered 2023-05-28: 0.2 ug/kg/h via INTRAVENOUS
  Administered 2023-05-28: 0.7 ug/kg/h via INTRAVENOUS
  Administered 2023-05-29 (×2): 0.6 ug/kg/h via INTRAVENOUS
  Administered 2023-05-30 (×3): 1.2 ug/kg/h via INTRAVENOUS
  Administered 2023-05-30: 1 ug/kg/h via INTRAVENOUS
  Administered 2023-05-30 – 2023-05-31 (×5): 1.2 ug/kg/h via INTRAVENOUS
  Administered 2023-05-31: 0.02 ug/kg/h via INTRAVENOUS
  Administered 2023-06-01: 1 ug/kg/h via INTRAVENOUS
  Administered 2023-06-02: 0.2 ug/kg/h via INTRAVENOUS
  Administered 2023-06-02: 1.2 ug/kg/h via INTRAVENOUS
  Filled 2023-05-28: qty 200
  Filled 2023-05-28: qty 100
  Filled 2023-05-28: qty 200
  Filled 2023-05-28 (×7): qty 100
  Filled 2023-05-28: qty 200
  Filled 2023-05-28 (×5): qty 100

## 2023-05-28 NOTE — TOC Progression Note (Signed)
 Transition of Care [facility]) - Progression Note    Patient Details  Name: Ralph [name] MRN: [id number] Date of Birth: Nov 08, 1950  Transition of [facility]) CM/SW Contact  Lauraine FORBES Saa, LCSW Phone Number: 05/28/2023, 10:56 AM  Clinical Narrative:     10:56 AM Per progressions, patient has been reintubated.    Barriers to Discharge: Continued Medical Work up  Expected Discharge Plan and Services In-house Referral: Clinical Social Work     Living arrangements for the past 2 months: Single Family Home                                       Social Determinants of Health (SDOH) Interventions SDOH Screenings   Food Insecurity: Patient Unable To Answer (05/25/2023)  Housing: Patient Unable To Answer (05/25/2023)  Transportation Needs: Patient Unable To Answer (05/25/2023)  Utilities: Patient Unable To Answer (05/25/2023)  Social Connections: Patient Unable To Answer (05/25/2023)  Tobacco Use: Medium Risk (05/26/2023)    Readmission Risk Interventions     No data to display

## 2023-05-28 NOTE — Progress Notes (Signed)
 Patient readmitted from 2W on 3L [place]. Placed bilateral wrist restraints and started Precedex  infusion.

## 2023-05-28 NOTE — Progress Notes (Addendum)
 NAME:  Ralph [name], MRN:  [id number], DOB:  09-29-1950, LOS: 3 ADMISSION DATE:  05/25/2023, CONSULTATION DATE:  05/25/23 REFERRING MD:  Melvenia, EDP, CHIEF COMPLAINT:  respiratory arrest   History of Present Illness:  73 year old male with past medical history of hyperlipidemia, GERD, asthma, alcohol use who presented to the emergency department with respiratory arrest and unresponsiveness.  Per ED provider, significant other found him on the floor unresponsive about 30 minutes prior to arrival to the emergency department after a fall.  He required BVM assistance by EMS.  Additionally, significant other notes that he had a syncopal episode yesterday, had some coughing today. She states these episodes seemed to happen after he coughs, feels mildly short of breath like his asthma is starting up and then falls to the floor.   On my interview, significant other is at the bedside. She says he has otherwise been in his normal state of health. She denies him complaining of chest pain, shortness of breath, fever, n/v/d. No recent long flights or drives. No complaints of headache, nausea, visual changes, weakness. She notes yesterday he got out of the car, began coughing and lowered himself to the ground. States he felt like he had passed out but maybe not. Additionally, today, she states he was clearing his throat at the sink, and then called out for her. She states he was laying on the ground stating he felt like he was going to die. States he does drink alcohol daily, about 4 shots of liquor and a few beers. She denies him every having alcohol withdrawals. States his asthma is under control with his daily maintenance inhalers.   Pertinent  Medical History  Hyperlipidemia, GERD, asthma, alcohol use  Significant Hospital Events: Including procedures, antibiotic start and stop dates in addition to other pertinent events   2/3: Admit to CCM 2/4 extubated and transferred to floor 2/6 PCCM called back to  floor for etoh withdrawal symptoms.   Interim History / Subjective:  Since transfer out of ICU patient was initiailly doing well from and ETOH wd perspective, but 2/5-2/6 overnight he became increasingly agitated refractory to phenobarb taper and 16mg  total of ativan . PCCM asked to transfer to ICU.  Objective   Blood pressure (!) 145/129, pulse 99, temperature (!) 100.4 F (38 C), temperature source Oral, resp. rate 18, height 5' 11 (1.803 m), weight 84.6 kg, SpO2 98%.        Intake/Output Summary (Last 24 hours) at 05/28/2023 0520 Last data filed at 05/27/2023 1700 Gross per 24 hour  Intake 652.5 ml  Output --  Net 652.5 ml   Filed Weights   05/25/23 0150 05/26/23 0500  Weight: 97 kg 84.6 kg    Examination: General: older adult male thrashing in bed.  HENT: [place]/AT, PERRL, no JVD Lungs: Sonorous rapid breaths.  Cardiovascular: s1s2, no murmur, rub, gallop Abdomen: Soft, NT, ND Extremities: No acute deformity or edema.  Neuro: acute agitated delirium.  Resolved Hospital Problem list    Assessment & Plan:   Alcohol withdrawal - s/p phenobarb and 16 mg ativan  tonight, remains increasingly agitated, combative.  - Transfer to ICU - Add precedex  for rass goal 0 - on thiamine , folic acid , mtvn  - Continue CIWA, phenobarb  Acute respiratory failure 2/2 above and inability to protect airway: improved Asthma without exacerbation - change from Breo/incruse to triple neb therapy.   Syncope: evaluated by neuology. Workup benign so far - Supportive care  GERD - h2b  Deconditioning - PT  Best Practice (right click and Reselect all SmartList Selections daily)   Diet/type: NPO DVT prophylaxis: prophylactic heparin   Pressure ulcer(s): na GI prophylaxis: H2B Lines: N/A Foley:  Yes, and it is still needed Code Status:  full code Last date of multidisciplinary goals of care discussion [updated wife on plan of care while interviewing her]  Labs   CBC: Recent Labs  Lab  05/25/23 0207 05/25/23 0211 05/25/23 0251 05/25/23 0421 05/26/23 0416 05/27/23 0711  WBC 9.6  --   --  6.7 9.2 8.9  NEUTROABS 5.5  --   --   --   --   --   HGB 15.4 14.6 13.6 13.3 13.5 14.2  HCT 46.1 43.0 40.0 39.7 39.4 40.1  MCV 107.7*  --   --  109.1* 107.7* 103.6*  PLT 198  --   --  176 157 163    Basic Metabolic Panel: Recent Labs  Lab 05/25/23 0207 05/25/23 0211 05/25/23 0251 05/25/23 0421 05/26/23 0416 05/27/23 0711  NA 140 140 137 140 140 140  K 4.7 4.6 3.9 3.3* 4.2 3.6  CL 104 105  --  104 104 104  CO2 22  --   --  21* 26 24  GLUCOSE 99 102*  --  110* 98 106*  BUN 17 21  --  16 13 16   CREATININE 1.46* 1.70*  --  1.34* 1.11 1.03  CALCIUM  9.5  --   --  8.8* 8.9 9.4  MG  --   --   --  1.8  --   --    GFR: Estimated Creatinine Clearance: 69 mL/min (by C-G formula based on SCr of 1.03 mg/dL). Recent Labs  Lab 05/25/23 0207 05/25/23 0421 05/26/23 0416 05/27/23 0711  WBC 9.6 6.7 9.2 8.9    Liver Function Tests: Recent Labs  Lab 05/25/23 0207  AST 50*  ALT 36  ALKPHOS 46  BILITOT 1.1  PROT 6.5  ALBUMIN 3.8   No results for input(s): LIPASE, AMYLASE in the last 168 hours. Recent Labs  Lab 05/25/23 0421  AMMONIA 56*    ABG    Component Value Date/Time   PHART 7.332 (L) 05/25/2023 0251   PCO2ART 42.8 05/25/2023 0251   PO2ART 339 (H) 05/25/2023 0251   HCO3 29.9 (H) 05/27/2023 1958   TCO2 24 05/25/2023 0251   ACIDBASEDEF 3.0 (H) 05/25/2023 0251   O2SAT 37.7 05/27/2023 1958     Coagulation Profile: Recent Labs  Lab 05/25/23 0421  INR 1.1    Cardiac Enzymes: No results for input(s): CKTOTAL, CKMB, CKMBINDEX, TROPONINI in the last 168 hours.  HbA1C: Hgb A1c MFr Bld  Date/Time Value Ref Range Status  05/25/2023 04:21 AM 5.2 4.8 - 5.6 % Final    Comment:    (NOTE) Pre diabetes:          5.7%-6.4%  Diabetes:              >6.4%  Glycemic control for   <7.0% adults with diabetes     CBG: Recent Labs  Lab  05/27/23 0530 05/27/23 0857 05/27/23 1141 05/27/23 1630 05/27/23 2011  GLUCAP 98 98 102* 83 103*    Review of Systems:   As above  Past Medical History:  He,  has a past medical history of Arthritis, Asthma, GERD (gastroesophageal reflux disease), History of kidney stones, Hyperlipidemia, Renal disorder, and Varicose veins.   Surgical History:   Past Surgical History:  Procedure Laterality Date   COLONOSCOPY WITH PROPOFOL  N/A 03/20/2015  Procedure: COLONOSCOPY WITH PROPOFOL ;  Surgeon: Gladis MARLA Louder, MD;  Location: [facility] ENDOSCOPY;  Service: Endoscopy;  Laterality: N/A;   HERNIA REPAIR     inguinal hernia   TONSILLECTOMY     VARICOSE VEIN SURGERY Bilateral    2 years ago   VASECTOMY       Social History:   reports that he quit smoking about 10 years ago. His smoking use included cigarettes. He started smoking about 30 years ago. He has a 10 pack-year smoking history. He has never used smokeless tobacco. He reports current alcohol use. He reports that he does not use drugs.   Family History:  His family history includes COPD in his father; Dementia in his mother.   Allergies Allergies  Allergen Reactions   Latex Hives and Rash   Morphine And Codeine Hives     Home Medications  Prior to Admission medications   Medication Sig Start Date End Date Taking? Authorizing Provider  ALPRAZolam (XANAX) 0.5 MG tablet Take 0.5 mg by mouth daily as needed for anxiety.   Yes [provider]  Budeson-Glycopyrrol-Formoterol  (BREZTRI AEROSPHERE) 160-9-4.8 MCG/ACT AERO Inhale 1 puff into the lungs daily.   Yes [provider]  escitalopram  (LEXAPRO ) 20 MG tablet Take 20 mg by mouth daily.   Yes [provider]  omeprazole (PRILOSEC) 20 MG capsule Take 20 mg by mouth daily.   Yes [provider]  tamsulosin  (FLOMAX ) 0.4 MG CAPS capsule Take 0.4 mg by mouth at bedtime.   Yes [provider]  traMADol (ULTRAM) 50 MG tablet Take 50 mg by mouth  daily as needed for moderate pain (pain score 4-6).   Yes [provider]  rosuvastatin  (CRESTOR ) 40 MG tablet Take 40 mg by mouth daily. Patient not taking: Reported on 05/25/2023    [provider]     Critical care time: 32     Deward Eastern, AGACNP-BC [facility]  See Amion for personal pager PCCM on call pager [phone] until 7pm. Please call Elink 7p-7a. [phone]  05/28/2023 5:28 AM    Patient seen and examined, note reviewed with the signed Advanced Practice Provider. I personally reviewed laboratory data, imaging studies and relevant notes. I independently examined the patient and formulated the important aspects of the plan. Comments or changes to the note/plan are indicated below.   Yesterday ativan  21 mg and remained agitated requiring Precedex   Blood pressure (!) 145/129, pulse 94, temperature 100 F (37.8 C), temperature source Axillary, resp. rate (!) 34, height 5' 11 (1.803 m), weight 84.6 kg, SpO2 100%.  Physical Exam: General: Agitated lethargic-appearing HENT: [place], AT Eyes: EOMI, no scleral icterus Respiratory: Central rhonchi Cardiovascular: RRR, -M/R/G, no JVD Extremities:-Edema,-tenderness Neuro: Lethargic, moves extremities x 4, does not follow commands Labs and imaging reviewed.  Alcohol withdrawal Asthma Syncope GERD Deconditioning  Initially admitted syncope with respiratory failure requiring intubation. Extubated and work-up with Neurology negative. Readmitted to ICU for agitation secondary to alcohol withdrawal. Not protecting airway. Intubated for airway protection.  The patient is critically ill with multiple organ systems failure and requires high complexity decision making for assessment and support, frequent evaluation and titration of therapies, application of advanced monitoring technologies and extensive interpretation of multiple databases.  Independent Critical Care Time: 35 Minutes.   Slater Staff, M.D. [facility] Pulmonary/Critical Care Medicine 05/28/2023 7:02 AM   Please see Amion for pager number to reach on-call Pulmonary and Critical Care Team.

## 2023-05-28 NOTE — Progress Notes (Signed)
 eLink Physician-Brief Progress Note Patient Name: Ralph [name] DOB: 07-Apr-1951 MRN: [id number]   Date of Service  05/28/2023  HPI/Events of Note  Patient originally admitted to the ICU for acute respiratory failure and was stepped down following improvement but is now in alcohol withdrawal delirium and was moved back to the ICU for more aggressive Rx.  eICU Interventions  New Patient Evaluation.        Shynia Daleo U Consandra Laske 05/28/2023, 6:34 AM

## 2023-05-28 NOTE — Plan of Care (Signed)
 MRI brain completed yesterday. The study was limited due to poor cooperation. Within this limitation, no evidence of acute or subacute infarct.  He was moved back to the ICU this AM for more aggressive treatment of his alcohol withdrawal, with CIWA scores > 20 despite hourly doses of IV Ativan .   From a Neurology standpoint, our portion of his work up is now complete. Neurohospitalist service will sign off. Please call if there are additional questions.   Electronically signed: Dr. Leticia Mcdiarmid

## 2023-05-28 NOTE — Progress Notes (Signed)
 PT Cancellation Note  Patient Details Name: Ralph [name] MRN: [id number] DOB: [birth year]   Cancelled Treatment:    Reason Eval/Treat Not Completed: Patient not medically ready (increased agitation). Will keep an eye on from afar and eval when pt can tolerate.   Ralph [name], PT  Acute Rehab Services Secure chat preferred Office [phone]    Ralph [name] 05/28/2023, 8:37 AM

## 2023-05-28 NOTE — Plan of Care (Signed)
  Problem: Fluid Volume: Goal: Ability to maintain a balanced intake and output will improve Outcome: Progressing   Problem: Metabolic: Goal: Ability to maintain appropriate glucose levels will improve Outcome: Progressing   Problem: Nutritional: Goal: Maintenance of adequate nutrition will improve Outcome: Progressing   Problem: Skin Integrity: Goal: Risk for impaired skin integrity will decrease Outcome: Progressing   Problem: Tissue Perfusion: Goal: Adequacy of tissue perfusion will improve Outcome: Progressing   Problem: Clinical Measurements: Goal: Ability to maintain clinical measurements within normal limits will improve Outcome: Progressing Goal: Will remain free from infection Outcome: Progressing Goal: Diagnostic test results will improve Outcome: Progressing Goal: Respiratory complications will improve Outcome: Progressing Goal: Cardiovascular complication will be avoided Outcome: Progressing

## 2023-05-28 NOTE — Procedures (Signed)
 Intubation Procedure Note  KALETH KOY  [id number]  01-20-1951  Date:05/28/23  Time:10:18 AM   Provider Performing:Eriq Hufford    Procedure: Intubation (31500)  Indication(s) Respiratory Failure  Consent Unable to obtain consent due to emergent nature of procedure.   Anesthesia Etomidate , Versed , Fentanyl , and Rocuronium    Time Out Verified patient identification, verified procedure, site/side was marked, verified correct patient position, special equipment/implants available, medications/allergies/relevant history reviewed, required imaging and test results available.   Sterile Technique Usual hand hygeine, masks, and gloves were used   Procedure Description Patient positioned in bed supine.  Sedation given as noted above.  Patient was intubated with endotracheal tube using Glidescope.  View was Grade 1 full glottis .  Number of attempts was  2 .  Colorimetric CO2 detector was consistent with tracheal placement.   Complications/Tolerance None; patient tolerated the procedure well. Chest X-ray is ordered to verify placement.   EBL Minimal   Specimen(s) None

## 2023-05-29 DIAGNOSIS — R55 Syncope and collapse: Secondary | ICD-10-CM | POA: Diagnosis not present

## 2023-05-29 DIAGNOSIS — F10930 Alcohol use, unspecified with withdrawal, uncomplicated: Secondary | ICD-10-CM

## 2023-05-29 LAB — GLUCOSE, CAPILLARY
Glucose-Capillary: 106 mg/dL — ABNORMAL HIGH (ref 70–99)
Glucose-Capillary: 129 mg/dL — ABNORMAL HIGH (ref 70–99)
Glucose-Capillary: 129 mg/dL — ABNORMAL HIGH (ref 70–99)
Glucose-Capillary: 123 mg/dL — ABNORMAL HIGH (ref 70–99)
Glucose-Capillary: 120 mg/dL — ABNORMAL HIGH (ref 70–99)
Glucose-Capillary: 126 mg/dL — ABNORMAL HIGH (ref 70–99)

## 2023-05-29 LAB — CBC
HCT: 40.8 % (ref 39.0–52.0)
Hemoglobin: 14.2 g/dL (ref 13.0–17.0)
MCH: 36.6 pg — ABNORMAL HIGH (ref 26.0–34.0)
MCHC: 34.8 g/dL (ref 30.0–36.0)
MCV: 105.2 fL — ABNORMAL HIGH (ref 80.0–100.0)
Platelets: 174 10*3/uL (ref 150–400)
RBC: 3.88 MIL/uL — ABNORMAL LOW (ref 4.22–5.81)
RDW: 12.5 % (ref 11.5–15.5)
WBC: 8.5 10*3/uL (ref 4.0–10.5)
nRBC: 0 % (ref 0.0–0.2)

## 2023-05-29 LAB — COMPREHENSIVE METABOLIC PANEL
ALT: 35 U/L (ref 0–44)
AST: 21 U/L (ref 15–41)
Albumin: 3.4 g/dL — ABNORMAL LOW (ref 3.5–5.0)
Alkaline Phosphatase: 38 U/L (ref 38–126)
Anion gap: 12 (ref 5–15)
BUN: 26 mg/dL — ABNORMAL HIGH (ref 8–23)
CO2: 22 mmol/L (ref 22–32)
Calcium: 9.5 mg/dL (ref 8.9–10.3)
Chloride: 105 mmol/L (ref 98–111)
Creatinine, Ser: 1.21 mg/dL (ref 0.61–1.24)
GFR, Estimated: >60 mL/min (ref >60)
Glucose, Bld: 122 mg/dL — ABNORMAL HIGH (ref 70–99)
Potassium: 3.7 mmol/L (ref 3.5–5.1)
Sodium: 139 mmol/L (ref 135–145)
Total Bilirubin: 1.6 mg/dL — ABNORMAL HIGH (ref 0.0–1.2)
Total Protein: 6.4 g/dL — ABNORMAL LOW (ref 6.5–8.1)

## 2023-05-29 LAB — PHOSPHORUS: Phosphorus: 4.1 mg/dL (ref 2.5–4.6)

## 2023-05-29 MED ORDER — PROSOURCE TF20 ENFIT COMPATIBL EN LIQD
60.0000 mL | Freq: Every day | ENTERAL | Status: DC
  Administered 2023-05-29: 60 mL
  Filled 2023-05-29 (×3): qty 60

## 2023-05-29 MED ORDER — FENTANYL CITRATE PF 50 MCG/ML IJ SOSY
PREFILLED_SYRINGE | INTRAMUSCULAR | Status: AC
  Administered 2023-05-29: 50 ug via INTRAVENOUS
  Filled 2023-05-29: qty 1

## 2023-05-29 MED ORDER — FENTANYL CITRATE PF 50 MCG/ML IJ SOSY: 50.0000 ug | PREFILLED_SYRINGE | Freq: Once | INTRAMUSCULAR | Status: AC

## 2023-05-29 MED ORDER — OSMOLITE 1.5 CAL PO LIQD
1000.0000 mL | ORAL | Status: DC
Start: 2023-05-29 — End: 2023-06-02
  Filled 2023-05-29 (×7): qty 1000

## 2023-05-29 MED ORDER — KETOROLAC TROMETHAMINE 15 MG/ML IJ SOLN
15.0000 mg | Freq: Once | INTRAMUSCULAR | Status: AC
  Administered 2023-05-29: 15 mg via INTRAVENOUS
  Filled 2023-05-29: qty 1

## 2023-05-29 MED ORDER — DEXTROSE IN LACTATED RINGERS 5 % IV SOLN: INTRAVENOUS | Status: AC

## 2023-05-29 NOTE — Evaluation (Signed)
 Physical Therapy Evaluation Patient Details Name: Ralph [name] MRN: [id number] DOB: 17-Mar-1951 Today's Date: 05/29/2023  History of Present Illness  73 Y/O male with respiratory arrest admitted 2/3, developed etoh withdrawal, DTs. Intubated 2/6-2/7.  PMHx: hyperlipidemia, GERD, asthma, alcohol use.   Clinical Impression  Pt admitted with above diagnosis. Initially lethargic but more alert as we progressed with evaluation. 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. Re-oriented pt. Agreeable to work with PT. Able to stand with min assist for balance holding his RUE. Wide BOS and tremulous. Able to take several small steps along bed. Impulsively grabbing lines/leads at times, needs multimodal cues to orient and sequence for safety. Anticipate good functional recovery as he improves medically. Will progress as tolerated. Was on 4L supplemental O2 throughout session with VSS. Pt will potentially need OPPT at d/c; has a laborious job as a music therapist. Will update recs as appropriate. Pt currently with functional limitations due to the deficits listed below (see PT Problem List). Pt will benefit from acute skilled PT to increase their independence and safety with mobility to allow discharge.           If plan is discharge home, recommend the following: A lot of help with walking and/or transfers;A lot of help with bathing/dressing/bathroom;Assistance with cooking/housework;Direct supervision/assist for medications management;Direct supervision/assist for financial management;Assist for transportation;Supervision due to cognitive status   Can travel by private vehicle        Equipment Recommendations None recommended by PT  Recommendations for Other Services       Functional Status Assessment Patient has had a recent decline in their  functional status and demonstrates the ability to make significant improvements in function in a reasonable and predictable amount of time.     Precautions / Restrictions Precautions Precautions: Fall Restrictions Weight Bearing Restrictions Per Provider Order: No      Mobility  Bed Mobility Overal bed mobility: Needs Assistance Bed Mobility: Supine to Sit, Sit to Supine     Supine to sit: Min assist, HOB elevated, Used rails Sit to supine: Contact guard assist   General bed mobility comments: Min assist to facilitate LE and trunk to EOB. CGA to return to supine. Needs assist to reposition in bed due to confusion, pulling at cords rather than rail, needs assist to grasp and pull, pushing through LEs into bridge position.    Transfers Overall transfer level: Needs assistance Equipment used: 1 person hand held assist Transfers: Sit to/from Stand Sit to Stand: Min assist           General transfer comment: Min assist for boost and balance, wide BOS, tremulous. No overt buckling but demonstrates increased sway., Reaches for untable surfaces with LUE. Suspect this will look better with RW next visit.    Ambulation/Gait Ambulation/Gait assistance: Min assist Gait Distance (Feet): 3 Feet Assistive device: 1 person hand held assist Gait Pattern/deviations: Step-to pattern, Shuffle, Wide base of support       General Gait Details: Unstable, requires min assist for balance, VC to sequence. Able to step in place with wide BOS, reaching for rail on bed, IV pole. Cues for safety and technique, able to take several steps laterally along bed and reposition prior to sitting.  Stairs            Psychologist, [employer]  Tilt Bed    Modified Rankin (Stroke Patients Only)       Balance                                             Pertinent Vitals/Pain Pain Assessment Pain Assessment: Faces Faces Pain Scale: Hurts little more Pain Location: back Pain  Descriptors / Indicators: Aching Pain Intervention(s): Monitored during session, Repositioned    Home Living Family/patient expects to be discharged to:: Private residence Living Arrangements: Spouse/significant other Available Help at Discharge: Family;Available 24 hours/day Type of Home: House Home Access: Level entry       Home Layout: One level Home Equipment: None Additional Comments: Answers questions confidently but may need to confirm due to confusion.    Prior Function Prior Level of Function : Independent/Modified Independent;Working/employed;Driving             Mobility Comments: ind works as music therapist ADLs Comments: ind works as music therapist, owns business.     Extremity/Trunk Assessment   Upper Extremity Assessment Upper Extremity Assessment: Defer to OT evaluation    Lower Extremity Assessment Lower Extremity Assessment: Difficult to assess due to impaired cognition       Communication   Communication Communication: Difficulty communicating thoughts/reduced clarity of speech;Difficulty following commands/understanding Following commands: Follows one step commands inconsistently;Follows one step commands with increased time Cueing Techniques: Verbal cues;Gestural cues;Tactile cues;Visual cues  Cognition Arousal: Lethargic Behavior During Therapy: Flat affect, Impulsive Overall Cognitive Status: Impaired/Different from baseline Area of Impairment: Orientation, Following commands, Safety/judgement, Problem solving                 Orientation Level: Disoriented to, Place, Time, Situation (Thought he was at [place] [facility]; gave correct year, but thought it was July.)     Following Commands: Follows one step commands inconsistently, Follows one step commands with increased time Safety/Judgement: Decreased awareness of safety, Decreased awareness of deficits   Problem Solving: Slow processing, Decreased initiation, Difficulty sequencing, Requires verbal  cues, Requires tactile cues General Comments: Reaching for unstable surfaces and pulling on cords at times when attempting to readjust his position in bed. Requires multimodal cues to orient and sequence safe mobility techniques.        General Comments General comments (skin integrity, edema, etc.): VSS, on 4L [place].    Exercises     Assessment/Plan    PT Assessment Patient needs continued PT services  PT Problem List Decreased activity tolerance;Decreased balance;Decreased mobility;Decreased coordination;Decreased cognition;Decreased knowledge of use of DME;Decreased safety awareness;Decreased knowledge of precautions;Cardiopulmonary status limiting activity       PT Treatment Interventions DME instruction;Gait training;Functional mobility training;Therapeutic exercise;Therapeutic activities;Balance training;Neuromuscular re-education;Cognitive remediation;Patient/family education    PT Goals (Current goals can be found in the Care Plan section)  Acute Rehab PT Goals Patient Stated Goal: none stated PT Goal Formulation: Patient unable to participate in goal setting Time For Goal Achievement: 06/12/23 Potential to Achieve Goals: Good    Frequency Min 1X/week     Co-evaluation               AM-PAC PT 6 Clicks Mobility  Outcome Measure Help needed turning from your back to your side while in a flat bed without using bedrails?: A Little Help needed moving from lying on your back to sitting on the side of a flat bed without using bedrails?: A Little Help needed moving to and  from a bed to a chair (including a wheelchair)?: A Little Help needed standing up from a chair using your arms (e.g., wheelchair or bedside chair)?: A Lot Help needed to walk in hospital room?: A Lot Help needed climbing 3-5 steps with a railing? : Total 6 Click Score: 14    End of Session Equipment Utilized During Treatment: Gait belt;Oxygen Activity Tolerance: Patient tolerated treatment  well Patient left: in bed;with call bell/phone within reach;with bed alarm set;with nursing/sitter in room Nurse Communication: Mobility status PT Visit Diagnosis: Unsteadiness on feet (R26.81);Difficulty in walking, not elsewhere classified (R26.2);Other symptoms and signs involving the nervous system (R29.898)    Time: 8449-8392 PT Time Calculation (min) (ACUTE ONLY): 17 min   Charges:   PT Evaluation $PT Eval Moderate Complexity: 1 Mod   PT General Charges $$ ACUTE PT VISIT: 1 Visit         Leontine Roads, PT, DPT [facility]  Rehabilitation Services Physical Therapist Office: [phone] Website: [email or web address]   Leontine GORMAN Roads 05/29/2023, 4:22 PM

## 2023-05-29 NOTE — Progress Notes (Signed)
 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  GOAL:  Patient will meet greater than or equal to 90% of their needs  MONITOR:  TF tolerance, Labs  REASON FOR ASSESSMENT:  Ventilator, Consult Enteral/tube feeding initiation and management  ASSESSMENT:  Pt with hx of GERD, HLD, and EtOH abuse presented to ED in respiratory distress. Intubated shortly after arrival to ED.  2/3 - intubated, admitted to ICU extubated 2/4 - transferred out of ICU, began showing signs of withdrawal 2/6 - back to ICU, reintubated   Patient is currently intubated on ventilator support. Daughter at bedside at the time of assessment able to provide a history. Stated that prior to admission, pt was eating well and regularly ate 3 meals each day. Also reported that pt was active and always on his feet. Does report that pt has been getting more SOB with some activities and has to take breaks but otherwise in his normal state of health. Reports stable weight hx.  Pt with OGT in place confirmed gastric. Discussed with attending, will start enteral feeds today. Placed recommendations.   MV: 10.5 L/min Temp (24hrs), Avg:98.5 F (36.9 C), Min:97.5 F (36.4 C), Max:99.1 F (37.3 C)  Admit weight: 97 kg ? accuracy  Current weight: 84.6 kg No recent weight hx available in chart   Intake/Output Summary (Last 24 hours) at 05/29/2023 1209 Last data filed at 05/29/2023 1000 Gross per 24 hour  Intake 928.3 ml   Output 1180 ml  Net -251.7 ml  Net IO Since Admission: 350.23 mL [05/29/23 1209]  Drains/Lines: OGT (gastric) out x 24 hours UOP 580 mL x 24 hours  Average Meal Intake: 2/5: 100% intake x 2 recorded meals  Nutritionally Relevant Medications: Scheduled Meds:  famotidine   20 mg Per Tube BID   folic acid   1 mg Per Tube Daily   multivitamin with minerals  1 tablet Per Tube Daily   polyethylene glycol  17 g Per Tube BID   senna-docusate  1 tablet Per Tube BID   Continuous Infusions:  dextrose  5% lactated ringers  75 mL/hr at 05/29/23 0600   thiamine  (VITAMIN B1) injection Stopped (05/28/23 1623)   PRN Meds: polyethylene glycol  Labs Reviewed: BUN 26 CBG ranges from 106-129 mg/dL over the last 24 hours HgbA1c 5.2% (2/3)  NUTRITION - FOCUSED PHYSICAL EXAM: Flowsheet Row Most Recent Value  Orbital Region Mild depletion  Upper Arm Region Moderate depletion  Thoracic and Lumbar Region No depletion  Buccal Region Mild depletion  Temple Region No depletion  Clavicle Bone Region Mild depletion  Clavicle and Acromion Bone Region Moderate depletion  Scapular Bone Region Moderate depletion  Dorsal Hand No depletion  Patellar Region Moderate depletion  Anterior Thigh Region Moderate depletion  Posterior Calf Region Mild depletion  Edema (RD Assessment) None  Hair Reviewed  Eyes Reviewed  Mouth Reviewed  Skin Reviewed  Nails Reviewed    Diet Order:   Diet Order             Diet NPO  time specified  Diet effective now                   EDUCATION NEEDS:  Not appropriate for education at this time  Skin:  Skin Assessment: Reviewed RN Assessment  Last BM:  2/5 - type 6  Height:  Ht Readings from Last 1 Encounters:  05/28/23 5' 11 (1.803 m)    Weight:  Wt Readings from Last 1 Encounters:  05/29/23 84.6 kg    Ideal Body Weight:  78.2 kg  BMI:  Body mass index is 26.01 kg/m.  Estimated Nutritional Needs:  Kcal:  1900-2100 kcal/d Protein:   100-120g/d Fluid:  1.9-2.1L/d    Vernell Lukes, RD, LDN Registered Dietitian II Please reach out via secure chat Weekend on-call pager # available in [facility]

## 2023-05-29 NOTE — Progress Notes (Signed)
 eLink Physician-Brief Progress Note Patient Name: HENRRY FEIL DOB: Mar 07, 1951 MRN: [id number]   Date of Service  05/29/2023  HPI/Events of Note  Patient with agitated delirium secondary to ETOH withdrawal and he is at risk of falling out of bed.  eICU Interventions  Posey restraints ordered.        Bobbijo Holst U Kaylia Winborne 05/29/2023, 8:38 PM

## 2023-05-29 NOTE — Progress Notes (Signed)
 PT Cancellation Note  Patient Details Name: Ralph [name] MRN: [id number] DOB: 04-15-1951   Cancelled Treatment:    Reason Eval/Treat Not Completed: Other (comment)  Spoke with nurse, weaning, gradually coming off sedation, possible extubation this afternoon.  Will check status this afternoon and attempt evaluation as appropriate.  Ralph [name], PT, DPT [facility]  Rehabilitation Services Physical Therapist Office: [phone] Website: [email or web address]   Ralph [name] 05/29/2023, 11:28 AM

## 2023-05-29 NOTE — Procedures (Signed)
 Extubation Procedure Note  Patient Details:   Name: Ralph [name] DOB: [birth year] MRN: [id number]   Airway Documentation:    Vent end date: 05/29/23 Vent end time: 1446   Evaluation  O2 sats: stable throughout Complications: No apparent complications Patient did tolerate procedure well. Bilateral Breath Sounds: Diminished   Yes   Pt extubated to 4 lpm [place] w/o complications. + cuff leak noted. No stridor post extubation. Bari [name] 05/29/2023, 2:54 PM

## 2023-05-29 NOTE — Progress Notes (Signed)
 NAME:  Ralph [name], MRN:  [id number], DOB:  January 20, 1951, LOS: 4 ADMISSION DATE:  05/25/2023, CONSULTATION DATE:  05/25/23 REFERRING MD:  Melvenia, EDP, CHIEF COMPLAINT:  respiratory arrest   History of Present Illness:  73 year old male with past medical history of hyperlipidemia, GERD, asthma, alcohol use who presented to the emergency department with respiratory arrest and unresponsiveness.  Per ED provider, significant other found him on the floor unresponsive about 30 minutes prior to arrival to the emergency department after a fall.  He required BVM assistance by EMS.  Additionally, significant other notes that he had a syncopal episode yesterday, had some coughing today. She states these episodes seemed to happen after he coughs, feels mildly short of breath like his asthma is starting up and then falls to the floor.    On my interview, significant other is at the bedside. She says he has otherwise been in his normal state of health. She denies him complaining of chest pain, shortness of breath, fever, n/v/d. No recent long flights or drives. No complaints of headache, nausea, visual changes, weakness. She notes yesterday he got out of the car, began coughing and lowered himself to the ground. States he felt like he had passed out but maybe not. Additionally, today, she states he was clearing his throat at the sink, and then called out for her. She states he was laying on the ground stating he felt like he was going to die. States he does drink alcohol daily, about 4 shots of liquor and a few beers. She denies him every having alcohol withdrawals. States his asthma is under control with his daily maintenance inhalers.   Pertinent  Medical History  Hyperlipidemia, GERD, asthma, alcohol use   Significant Hospital Events: Including procedures, antibiotic start and stop dates in addition to other pertinent events   2/3: Admit to CCM 2/4 extubated and transferred to floor 2/6 PCCM called back to  floor for etoh withdrawal symptoms. Intubated.  Interim History / Subjective:  Remains intubated and sedated this morning. Reacts to forceful stimulation.  Objective   Blood pressure 101/70, pulse (!) 59, temperature 98.6 F (37 C), temperature source Oral, resp. rate 20, height 5' 11 (1.803 m), weight 84.6 kg, SpO2 100%.    Vent Mode: PRVC FiO2 (%):  [30 %-100 %] 30 % Set Rate:  [20 bmp] 20 bmp Vt Set:  [60 mL-600 mL] 600 mL PEEP:  [5 cmH20] 5 cmH20 Plateau Pressure:  [14 cmH20-15 cmH20] 15 cmH20   Intake/Output Summary (Last 24 hours) at 05/29/2023 9365 Last data filed at 05/29/2023 0600 Gross per 24 hour  Intake 708.63 ml  Output 580 ml  Net 128.63 ml   Filed Weights   05/25/23 0150 05/26/23 0500 05/29/23 0500  Weight: 97 kg 84.6 kg 84.6 kg    Examination: General: older adult male sedated in bed.  HENT: NCAT, PERRL Lungs: CTABL  Cardiovascular: RRR no MRG Abdomen: Soft, NT, ND Extremities: No acute deformity or edema.  Neuro: sedated  Resolved Hospital Problem list     Assessment & Plan:  Alcohol withdrawal Acute agitation onset midnight on am 2/6, approximately 3 days after last drink before hospitalization. Intubated 2/6 for airway protection. - On phenobarb taper through 2/11  - On thiamine , folic acid , mtvn  - Precedex  for rass goal 0 - Continue CIWA, phenobarb - Wean sedation today for extubation.   Acute respiratory failure 2/2 above and inability to protect airway: improved Asthma without exacerbation - change from Breo/incruse to  triple neb therapy.    Syncope: evaluated by neuology. Workup benign and they have signed off. - Supportive care   GERD - h2b   Deconditioning - PT  Best Practice (right click and Reselect all SmartList Selections daily)   Diet/type: NPO, tube feeds DVT prophylaxis: prophylactic heparin   Pressure ulcer(s): na GI prophylaxis: H2B Lines: N/A Foley:  Yes, and it is still needed Code Status:  full code Last date  of multidisciplinary goals of care discussion [updated wife on plan of care while interviewing her]  Labs   CBC: Recent Labs  Lab 05/25/23 0207 05/25/23 0211 05/25/23 0421 05/26/23 0416 05/27/23 0711 05/28/23 0806 05/28/23 1219 05/29/23 0205  WBC 9.6  --  6.7 9.2 8.9 11.1*  --  8.5  NEUTROABS 5.5  --   --   --   --  9.0*  --   --   HGB 15.4   < > 13.3 13.5 14.2 14.5 12.9* 14.2  HCT 46.1   < > 39.7 39.4 40.1 40.8 38.0* 40.8  MCV 107.7*  --  109.1* 107.7* 103.6* 103.3*  --  105.2*  PLT 198  --  176 157 163 179  --  174   < > = values in this interval not displayed.    Basic Metabolic Panel: Recent Labs  Lab 05/25/23 0421 05/26/23 0416 05/27/23 0711 05/28/23 0806 05/28/23 1219 05/29/23 0205  NA 140 140 140 140 139 139  K 3.3* 4.2 3.6 3.7 3.9 3.7  CL 104 104 104 104  --  105  CO2 21* 26 24 22   --  22  GLUCOSE 110* 98 106* 106*  --  122*  BUN 16 13 16 15   --  26*  CREATININE 1.34* 1.11 1.03 0.96  --  1.21  CALCIUM  8.8* 8.9 9.4 9.5  --  9.5  MG 1.8  --   --  2.0  --   --   PHOS  --   --   --   --   --  4.1   GFR: Estimated Creatinine Clearance: 58.8 mL/min (by C-G formula based on SCr of 1.21 mg/dL). Recent Labs  Lab 05/26/23 0416 05/27/23 0711 05/28/23 0806 05/29/23 0205  WBC 9.2 8.9 11.1* 8.5    Liver Function Tests: Recent Labs  Lab 05/25/23 0207 05/29/23 0205  AST 50* 21  ALT 36 35  ALKPHOS 46 38  BILITOT 1.1 1.6*  PROT 6.5 6.4*  ALBUMIN 3.8 3.4*   No results for input(s): LIPASE, AMYLASE in the last 168 hours. Recent Labs  Lab 05/25/23 0421  AMMONIA 56*    ABG    Component Value Date/Time   PHART 7.434 05/28/2023 1219   PCO2ART 36.5 05/28/2023 1219   PO2ART 410 (H) 05/28/2023 1219   HCO3 24.4 05/28/2023 1219   TCO2 26 05/28/2023 1219   ACIDBASEDEF 3.0 (H) 05/25/2023 0251   O2SAT 100 05/28/2023 1219     Coagulation Profile: Recent Labs  Lab 05/25/23 0421  INR 1.1    Cardiac Enzymes: No results for input(s): CKTOTAL,  CKMB, CKMBINDEX, TROPONINI in the last 168 hours.  HbA1C: Hgb A1c MFr Bld  Date/Time Value Ref Range Status  05/25/2023 04:21 AM 5.2 4.8 - 5.6 % Final    Comment:    (NOTE) Pre diabetes:          5.7%-6.4%  Diabetes:              >6.4%  Glycemic control for   <7.0% adults with diabetes  CBG: Recent Labs  Lab 05/28/23 1113 05/28/23 1512 05/28/23 1916 05/28/23 2325 05/29/23 0335  GLUCAP 126* 120* 124* 126* 106*    Review of Systems:   Negative per above  Past Medical History:  He,  has a past medical history of Arthritis, Asthma, GERD (gastroesophageal reflux disease), History of kidney stones, Hyperlipidemia, Renal disorder, and Varicose veins.   Surgical History:   Past Surgical History:  Procedure Laterality Date   COLONOSCOPY WITH PROPOFOL  N/A 03/20/2015   Procedure: COLONOSCOPY WITH PROPOFOL ;  Surgeon: Gladis MARLA Louder, MD;  Location: [facility];  Service: Endoscopy;  Laterality: N/A;   HERNIA REPAIR     inguinal hernia   TONSILLECTOMY     VARICOSE VEIN SURGERY Bilateral    2 years ago   VASECTOMY       Social History:   reports that he quit smoking about 10 years ago. His smoking use included cigarettes. He started smoking about 30 years ago. He has a 10 pack-year smoking history. He has never used smokeless tobacco. He reports current alcohol use. He reports that he does not use drugs.   Family History:  His family history includes COPD in his father; Dementia in his mother.   Allergies Allergies  Allergen Reactions   Latex Hives and Rash   Morphine And Codeine Hives     Home Medications  Prior to Admission medications   Medication Sig Start Date End Date Taking? Authorizing Provider  ALPRAZolam (XANAX) 0.5 MG tablet Take 0.5 mg by mouth daily as needed for anxiety.   Yes [provider]  traMADol (ULTRAM) 50 MG tablet Take 50 mg by mouth daily as needed for moderate pain (pain score 4-6).   Yes [provider]   Budeson-Glycopyrrol-Formoterol  (BREZTRI AEROSPHERE) 160-9-4.8 MCG/ACT AERO Inhale 1 puff into the lungs daily.    [provider]  escitalopram  (LEXAPRO ) 20 MG tablet Take 20 mg by mouth daily.    [provider]  omeprazole (PRILOSEC) 20 MG capsule Take 20 mg by mouth daily.    [provider]  rosuvastatin  (CRESTOR ) 40 MG tablet Take 40 mg by mouth daily. Patient not taking: Reported on 05/25/2023    [provider]  tamsulosin  (FLOMAX ) 0.4 MG CAPS capsule Take 0.4 mg by mouth at bedtime.    [provider]     Critical care time: 35 mins

## 2023-05-30 DIAGNOSIS — E440 Moderate protein-calorie malnutrition: Secondary | ICD-10-CM | POA: Insufficient documentation

## 2023-05-30 DIAGNOSIS — J9600 Acute respiratory failure, unspecified whether with hypoxia or hypercapnia: Secondary | ICD-10-CM | POA: Diagnosis not present

## 2023-05-30 DIAGNOSIS — F10930 Alcohol use, unspecified with withdrawal, uncomplicated: Secondary | ICD-10-CM | POA: Diagnosis not present

## 2023-05-30 LAB — GLUCOSE, CAPILLARY
Glucose-Capillary: 130 mg/dL — ABNORMAL HIGH (ref 70–99)
Glucose-Capillary: 107 mg/dL — ABNORMAL HIGH (ref 70–99)
Glucose-Capillary: 104 mg/dL — ABNORMAL HIGH (ref 70–99)
Glucose-Capillary: 101 mg/dL — ABNORMAL HIGH (ref 70–99)
Glucose-Capillary: 92 mg/dL (ref 70–99)
Glucose-Capillary: 80 mg/dL (ref 70–99)

## 2023-05-30 LAB — CBC
HCT: 39 % (ref 39.0–52.0)
Hemoglobin: 13.4 g/dL (ref 13.0–17.0)
MCH: 36.3 pg — ABNORMAL HIGH (ref 26.0–34.0)
MCHC: 34.4 g/dL (ref 30.0–36.0)
MCV: 105.7 fL — ABNORMAL HIGH (ref 80.0–100.0)
Platelets: 158 10*3/uL (ref 150–400)
RBC: 3.69 MIL/uL — ABNORMAL LOW (ref 4.22–5.81)
RDW: 12.1 % (ref 11.5–15.5)
WBC: 8.3 10*3/uL (ref 4.0–10.5)
nRBC: 0 % (ref 0.0–0.2)

## 2023-05-30 LAB — BASIC METABOLIC PANEL
Anion gap: 12 (ref 5–15)
BUN: 31 mg/dL — ABNORMAL HIGH (ref 8–23)
CO2: 22 mmol/L (ref 22–32)
Calcium: 9.2 mg/dL (ref 8.9–10.3)
Chloride: 107 mmol/L (ref 98–111)
Creatinine, Ser: 1.01 mg/dL (ref 0.61–1.24)
GFR, Estimated: >60 mL/min (ref >60)
Glucose, Bld: 128 mg/dL — ABNORMAL HIGH (ref 70–99)
Potassium: 3.3 mmol/L — ABNORMAL LOW (ref 3.5–5.1)
Sodium: 141 mmol/L (ref 135–145)

## 2023-05-30 MED ORDER — POTASSIUM CHLORIDE 10 MEQ/100ML IV SOLN
10.0000 meq | Freq: Once | INTRAVENOUS | Status: AC
  Administered 2023-05-30: 10 meq via INTRAVENOUS
  Filled 2023-05-30: qty 100

## 2023-05-30 MED ORDER — POLYETHYLENE GLYCOL 3350 17 G PO PACK: 17.0000 g | PACK | Freq: Every day | ORAL | Status: DC | PRN

## 2023-05-30 MED ORDER — ORAL CARE MOUTH RINSE: 15.0000 mL | OROMUCOSAL | Status: DC | PRN

## 2023-05-30 MED ORDER — FOLIC ACID 1 MG PO TABS
1.0000 mg | ORAL_TABLET | Freq: Every day | ORAL | Status: DC
  Administered 2023-06-01 – 2023-06-09 (×9): 1 mg via ORAL
  Filled 2023-05-30 (×10): qty 1

## 2023-05-30 MED ORDER — SENNOSIDES-DOCUSATE SODIUM 8.6-50 MG PO TABS
1.0000 | ORAL_TABLET | Freq: Two times a day (BID) | ORAL | Status: DC
  Administered 2023-05-31 – 2023-06-08 (×7): 1 via ORAL
  Filled 2023-05-30 (×9): qty 1

## 2023-05-30 MED ORDER — ADULT MULTIVITAMIN W/MINERALS CH
1.0000 | ORAL_TABLET | Freq: Every day | ORAL | Status: DC
  Administered 2023-06-01 – 2023-06-09 (×9): 1 via ORAL
  Filled 2023-05-30 (×10): qty 1

## 2023-05-30 MED ORDER — ACETAMINOPHEN 325 MG PO TABS
650.0000 mg | ORAL_TABLET | ORAL | Status: DC | PRN
  Administered 2023-06-02 – 2023-06-08 (×6): 650 mg via ORAL
  Filled 2023-05-30 (×6): qty 2

## 2023-05-30 MED ORDER — POLYETHYLENE GLYCOL 3350 17 G PO PACK
17.0000 g | PACK | Freq: Two times a day (BID) | ORAL | Status: DC
  Administered 2023-05-31 – 2023-06-08 (×4): 17 g via ORAL
  Filled 2023-05-30 (×6): qty 1

## 2023-05-30 MED ORDER — ESCITALOPRAM OXALATE 10 MG PO TABS
20.0000 mg | ORAL_TABLET | Freq: Every day | ORAL | Status: DC
  Administered 2023-06-01 – 2023-06-09 (×9): 20 mg via ORAL
  Filled 2023-05-30 (×11): qty 2

## 2023-05-30 MED ORDER — POTASSIUM CHLORIDE 10 MEQ/100ML IV SOLN
10.0000 meq | INTRAVENOUS | Status: AC
  Administered 2023-05-30 (×5): 10 meq via INTRAVENOUS
  Filled 2023-05-30 (×6): qty 100

## 2023-05-30 MED ORDER — FAMOTIDINE 20 MG PO TABS
20.0000 mg | ORAL_TABLET | Freq: Two times a day (BID) | ORAL | Status: DC
  Administered 2023-05-31 – 2023-06-04 (×8): 20 mg via ORAL
  Filled 2023-05-30 (×9): qty 1

## 2023-05-30 NOTE — Progress Notes (Signed)
 NAME:  Ralph [name], MRN:  [id number], DOB:  04/24/1950, LOS: 5 ADMISSION DATE:  05/25/2023, CONSULTATION DATE:  05/25/23 REFERRING MD:  Melvenia, EDP, CHIEF COMPLAINT:  respiratory arrest   History of Present Illness:  73 year old male with past medical history of hyperlipidemia, GERD, asthma, alcohol use who presented to the emergency department with respiratory arrest and unresponsiveness.  Per ED provider, significant other found him on the floor unresponsive about 30 minutes prior to arrival to the emergency department after a fall.  He required BVM assistance by EMS.  Additionally, significant other notes that he had a syncopal episode yesterday, had some coughing today. She states these episodes seemed to happen after he coughs, feels mildly short of breath like his asthma is starting up and then falls to the floor.    On my interview, significant other is at the bedside. She says he has otherwise been in his normal state of health. She denies him complaining of chest pain, shortness of breath, fever, n/v/d. No recent long flights or drives. No complaints of headache, nausea, visual changes, weakness. She notes yesterday he got out of the car, began coughing and lowered himself to the ground. States he felt like he had passed out but maybe not. Additionally, today, she states he was clearing his throat at the sink, and then called out for her. She states he was laying on the ground stating he felt like he was going to die. States he does drink alcohol daily, about 4 shots of liquor and a few beers. She denies him every having alcohol withdrawals. States his asthma is under control with his daily maintenance inhalers.   Pertinent  Medical History  Hyperlipidemia, GERD, asthma, alcohol use   Significant Hospital Events: Including procedures, antibiotic start and stop dates in addition to other pertinent events   2/3: Admit to CCM 2/4 extubated and transferred to floor 2/6 PCCM called back to  floor for etoh withdrawal symptoms. Intubated. 2/7 Extubated  Interim History / Subjective:   Extubated yesterday.  Agitated delirium overnight.  Remains on Precedex  drip  Objective   Blood pressure 136/60, pulse (!) 52, temperature 97.6 F (36.4 C), temperature source Axillary, resp. rate 20, height 5' 11 (1.803 m), weight 84.6 kg, SpO2 99%.    Vent Mode: CPAP;PSV FiO2 (%):  [30 %-36 %] 36 % PEEP:  [5 cmH20] 5 cmH20 Pressure Support:  [5 cmH20-8 cmH20] 5 cmH20   Intake/Output Summary (Last 24 hours) at 05/30/2023 0713 Last data filed at 05/30/2023 0700 Gross per 24 hour  Intake 2239.03 ml  Output 1500 ml  Net 739.03 ml   Filed Weights   05/26/23 0500 05/29/23 0500 05/30/23 0500  Weight: 84.6 kg 84.6 kg 84.6 kg    Examination: Blood pressure 136/60, pulse (!) 52, temperature 97.6 F (36.4 C), temperature source Axillary, resp. rate 20, height 5' 11 (1.803 m), weight 84.6 kg, SpO2 99%. Gen:      No acute distress HEENT:  EOMI, sclera anicteric Neck:     No masses; no thyromegaly Lungs:    Clear to auscultation bilaterally; normal respiratory effort CV:         Regular rate and rhythm; no murmurs Abd:      + bowel sounds; soft, non-tender; no palpable masses, no distension Ext:    No edema; adequate peripheral perfusion Skin:      Warm and dry; no rash Neuro: Somnolent  Lab/imaging reviewed Significant for potassium 3.3, BUN/creatinine 31/1.01 AST 21, ALT 35, total  bilirubin 1.6 WBC 8.3, hemoglobin 13.4, platelets 158  Resolved Hospital Problem list     Assessment & Plan:  Alcohol withdrawal Acute agitation onset midnight on am 2/6, approximately 3 days after last drink before hospitalization. Intubated 2/6 for airway protection. - On phenobarb taper through 2/11  - On thiamine , folic acid , mtvn  -Wean Precedex  as tolerated - Continue CIWA    Acute respiratory failure 2/2 above and inability to protect airway: improved Asthma without exacerbation - changed from  Breo/incruse to triple neb therapy.  -Wean down FiO2 as tolerated   Syncope: evaluated by neuology. Workup benign and they have signed off. - Supportive care   GERD - h2b   Deconditioning - PT  Best Practice (right click and Reselect all SmartList Selections daily)   Diet/type: NPO, tube feeds DVT prophylaxis: prophylactic heparin   Pressure ulcer(s): na GI prophylaxis: H2B Lines: N/A Foley:  Yes, and it is still needed Code Status:  full code Last date of multidisciplinary goals of care discussion [updated wife on plan of care while interviewing her]  Critical care time: 35 mins   The patient is critically ill with multiple organ system failure and requires high complexity decision making for assessment and support, frequent evaluation and titration of therapies, advanced monitoring, review of radiographic studies and interpretation of complex data.   Critical Care Time devoted to patient care services, exclusive of separately billable procedures, described in this note is 35 minutes.   Litzy Dicker MD [facility] See Amion for pager  If no response to pager , please call [phone] until 7pm After 7:00 pm call Elink  [phone] 05/30/2023, 7:18 AM

## 2023-05-30 NOTE — Progress Notes (Signed)
Dear Doctor:  This patient has been identified as a candidate for PICC for the following reason (s): poor veins/poor circulatory system (CHF, COPD, emphysema, diabetes, steroid use, IV drug abuse, etc.) If you agree, please write an order for the indicated device. For any questions contact the Vascular Access Team at [phone] if no answer, please leave a message.  Thank you for supporting the early vascular access assessment program. 

## 2023-05-30 NOTE — Progress Notes (Signed)
 [facility] ADULT ICU REPLACEMENT PROTOCOL  does The patient  apply for the [facility] Adult ICU Electrolyte Replacment Protocol based on the criteria listed below:   1.Exclusion criteria: TCTS, ECMO, Dialysis, and Myasthenia Gravis patients 2. Is GFR >/= 30 ml/min? Yes.    Patient's GFR today is >60 3. Is SCr </= 2? Yes.   Patient's SCr is 1.01 mg/dL 4. Did SCr increase >/= 0.5 in 24 hours? No. 5.Pt's weight >40kg  Yes.   6. Abnormal electrolyte(s): K+3.3  7. Electrolytes replaced per protocol 8.  Call MD STAT for K+ </= 2.5, Phos </= 1, or Mag </= 1 Physician:  Epimenio Ole BIRCH [facility] 05/30/2023 3:48 AM

## 2023-05-30 NOTE — Progress Notes (Addendum)
 eLink Physician-Brief Progress Note Patient Name: NUCHEM GRATTAN DOB: 1950-06-13 MRN: [id number]   Date of Service  05/30/2023  HPI/Events of Note  Requesting meds for chronic back/knee pain N.p.o. status  eICU Interventions  Changed to n.p.o. except meds. If the patient is unable to tolerate meds p.o. then will need to place enteric tube   0104 -patient has not received oral meds.  Patient still remains uncomfortable appearing, appears to be withdrawing more than anything.  Requesting alternative IV medications.  Patient needs enteral access for the administration of his remaining meds.  Proceed with NG-tube and enteral meds including.  One-time phenobarbital  for withdrawal  Intervention Category Intermediate Interventions: Pain - evaluation and management  Willette Mudry 05/30/2023, 11:42 PM

## 2023-05-31 ENCOUNTER — Inpatient Hospital Stay (HOSPITAL_COMMUNITY): Payer: Medicare Other

## 2023-05-31 DIAGNOSIS — F10930 Alcohol use, unspecified with withdrawal, uncomplicated: Secondary | ICD-10-CM | POA: Diagnosis not present

## 2023-05-31 DIAGNOSIS — K219 Gastro-esophageal reflux disease without esophagitis: Secondary | ICD-10-CM | POA: Diagnosis not present

## 2023-05-31 LAB — GLUCOSE, CAPILLARY
Glucose-Capillary: 92 mg/dL (ref 70–99)
Glucose-Capillary: 105 mg/dL — ABNORMAL HIGH (ref 70–99)

## 2023-05-31 LAB — BASIC METABOLIC PANEL
Anion gap: 12 (ref 5–15)
BUN: 20 mg/dL (ref 8–23)
CO2: 20 mmol/L — ABNORMAL LOW (ref 22–32)
Calcium: 9.3 mg/dL (ref 8.9–10.3)
Chloride: 109 mmol/L (ref 98–111)
Creatinine, Ser: 0.87 mg/dL (ref 0.61–1.24)
GFR, Estimated: >60 mL/min (ref >60)
Glucose, Bld: 106 mg/dL — ABNORMAL HIGH (ref 70–99)
Potassium: 3.7 mmol/L (ref 3.5–5.1)
Sodium: 141 mmol/L (ref 135–145)

## 2023-05-31 LAB — CBC
HCT: 37.3 % — ABNORMAL LOW (ref 39.0–52.0)
Hemoglobin: 13.5 g/dL (ref 13.0–17.0)
MCH: 37.5 pg — ABNORMAL HIGH (ref 26.0–34.0)
MCHC: 36.2 g/dL — ABNORMAL HIGH (ref 30.0–36.0)
MCV: 103.6 fL — ABNORMAL HIGH (ref 80.0–100.0)
Platelets: 186 10*3/uL (ref 150–400)
RBC: 3.6 MIL/uL — ABNORMAL LOW (ref 4.22–5.81)
RDW: 12 % (ref 11.5–15.5)
WBC: 8.7 10*3/uL (ref 4.0–10.5)
nRBC: 0 % (ref 0.0–0.2)

## 2023-05-31 LAB — MAGNESIUM: Magnesium: 1.9 mg/dL (ref 1.7–2.4)

## 2023-05-31 MED ORDER — PHENOBARBITAL SODIUM 65 MG/ML IJ SOLN
65.0000 mg | Freq: Once | INTRAMUSCULAR | Status: AC
  Administered 2023-05-31: 65 mg via INTRAVENOUS
  Filled 2023-05-31: qty 1

## 2023-05-31 MED ORDER — PHENOBARBITAL SODIUM 65 MG/ML IJ SOLN: 65.0000 mg | Freq: Three times a day (TID) | INTRAMUSCULAR | Status: DC

## 2023-05-31 MED ORDER — PHENOBARBITAL SODIUM 65 MG/ML IJ SOLN: 32.5000 mg | Freq: Three times a day (TID) | INTRAMUSCULAR | Status: DC

## 2023-05-31 MED ORDER — PHENOBARBITAL SODIUM 130 MG/ML IJ SOLN
97.5000 mg | Freq: Three times a day (TID) | INTRAMUSCULAR | Status: AC
  Administered 2023-05-31 – 2023-06-02 (×6): 97.5 mg via INTRAVENOUS
  Filled 2023-05-31 (×7): qty 1

## 2023-05-31 MED ORDER — WHITE PETROLATUM EX OINT
TOPICAL_OINTMENT | CUTANEOUS | Status: DC | PRN
  Filled 2023-05-31: qty 28.35

## 2023-05-31 NOTE — Progress Notes (Signed)
 NAME:  Ralph [name], MRN:  [id number], DOB:  01/13/1951, LOS: 6 ADMISSION DATE:  05/25/2023, CONSULTATION DATE:  05/25/23 REFERRING MD:  Melvenia, EDP, CHIEF COMPLAINT:  respiratory arrest   History of Present Illness:  73 year old male with past medical history of hyperlipidemia, GERD, asthma, alcohol use who presented to the emergency department with respiratory arrest and unresponsiveness.  Per ED provider, significant other found him on the floor unresponsive about 30 minutes prior to arrival to the emergency department after a fall.  He required BVM assistance by EMS.  Additionally, significant other notes that he had a syncopal episode yesterday, had some coughing today. She states these episodes seemed to happen after he coughs, feels mildly short of breath like his asthma is starting up and then falls to the floor.    On my interview, significant other is at the bedside. She says he has otherwise been in his normal state of health. She denies him complaining of chest pain, shortness of breath, fever, n/v/d. No recent long flights or drives. No complaints of headache, nausea, visual changes, weakness. She notes yesterday he got out of the car, began coughing and lowered himself to the ground. States he felt like he had passed out but maybe not. Additionally, today, she states he was clearing his throat at the sink, and then called out for her. She states he was laying on the ground stating he felt like he was going to die. States he does drink alcohol daily, about 4 shots of liquor and a few beers. She denies him every having alcohol withdrawals. States his asthma is under control with his daily maintenance inhalers.   Pertinent  Medical History  Hyperlipidemia, GERD, asthma, alcohol use   Significant Hospital Events: Including procedures, antibiotic start and stop dates in addition to other pertinent events   2/3: Admit to CCM 2/4 extubated and transferred to floor 2/6 PCCM called back to  floor for etoh withdrawal symptoms. Intubated. 2/7 Extubated 2/8 remains delirious on Precedex  drip  Interim History / Subjective:   Continues to be delirious and agitated.  On Precedex  drip  Objective   Blood pressure (!) 152/86, pulse (!) 57, temperature 98.9 F (37.2 C), temperature source Axillary, resp. rate (!) 31, height 5' 11 (1.803 m), weight 84.6 kg, SpO2 98%.    FiO2 (%):  [21 %] 21 %   Intake/Output Summary (Last 24 hours) at 05/31/2023 0738 Last data filed at 05/31/2023 0600 Gross per 24 hour  Intake 1241.09 ml  Output 1200 ml  Net 41.09 ml   Filed Weights   05/29/23 0500 05/30/23 0500 05/31/23 0400  Weight: 84.6 kg 84.6 kg 84.6 kg    Examination: Gen:      No acute distress, Chronically ill appearing HEENT:  EOMI, sclera anicteric Neck:     No masses; no thyromegaly Lungs:    Clear to auscultation bilaterally; normal respiratory effort CV:         Regular rate and rhythm; no murmurs Abd:      + bowel sounds; soft, non-tender; no palpable masses, no distension Ext:    No edema; adequate peripheral perfusion Skin:      Warm and dry; no rash Neuro: Sedated, opens eyes to stimuli  Lab/imaging reviewed Labs are stable.  No new imaging  Resolved Hospital Problem list     Assessment & Plan:  Alcohol withdrawal Acute agitation onset midnight on am 2/6, approximately 3 days after last drink before hospitalization. Intubated 2/6 for airway protection. -  On phenobarb taper through 2/11 but remains agitated.  Will will resume taper at a higher dose. - On thiamine , folic acid , mtvn  -Wean Precedex  as tolerated - Continue CIWA  Acute respiratory failure 2/2 above and inability to protect airway: improved Asthma without exacerbation - changed from Breo/incruse to triple neb therapy.  -Wean down FiO2 as tolerated   Syncope: evaluated by neuology. Workup benign and they have signed off. - Supportive care   GERD - H2B   Deconditioning - PT  Best Practice  (right click and Reselect all SmartList Selections daily)   Diet/type: NPO, tube feeds DVT prophylaxis: prophylactic heparin   Pressure ulcer(s): na GI prophylaxis: H2B Lines: N/A Foley:  Yes, and it is still needed Code Status:  full code Last date of multidisciplinary goals of care discussion []   Critical care time: 35 mins   The patient is critically ill with multiple organ system failure and requires high complexity decision making for assessment and support, frequent evaluation and titration of therapies, advanced monitoring, review of radiographic studies and interpretation of complex data.   Critical Care Time devoted to patient care services, exclusive of separately billable procedures, described in this note is 35 minutes.   Renna Kilmer MD [facility] See Amion for pager  If no response to pager , please call [phone] until 7pm After 7:00 pm call Elink  [phone] 05/31/2023, 7:38 AM

## 2023-05-31 NOTE — Progress Notes (Signed)
 Per MD attempt NG tube for P.O meds. This RN did attempted but was unsuccessful and CCM consulted again after pt experienced a nose bleed.

## 2023-05-31 NOTE — Progress Notes (Addendum)
 eLink Physician-Brief Progress Note Patient Name: Ralph [name] DOB: [birth year] MRN: [id number]   Date of Service  05/31/2023  HPI/Events of Note  73 year old male with past medical history of hyperlipidemia, GERD, asthma, alcohol use who presented to the emergency department with respiratory arrest and unresponsiveness.   Remains agitated.  eICU Interventions  Renew Posey belt   2255 -remains agitated and trying to get out of bed despite Posey belt.  Phenobarb x 1.   Intervention Category Minor Interventions: Agitation / anxiety - evaluation and management  Karem Farha 05/31/2023, 9:19 PM

## 2023-06-01 DIAGNOSIS — J9601 Acute respiratory failure with hypoxia: Secondary | ICD-10-CM | POA: Diagnosis not present

## 2023-06-01 DIAGNOSIS — F10939 Alcohol use, unspecified with withdrawal, unspecified: Secondary | ICD-10-CM | POA: Diagnosis not present

## 2023-06-01 LAB — CBC
HCT: 38.3 % — ABNORMAL LOW (ref 39.0–52.0)
Hemoglobin: 13 g/dL (ref 13.0–17.0)
MCH: 35.7 pg — ABNORMAL HIGH (ref 26.0–34.0)
MCHC: 33.9 g/dL (ref 30.0–36.0)
MCV: 105.2 fL — ABNORMAL HIGH (ref 80.0–100.0)
Platelets: 181 10*3/uL (ref 150–400)
RBC: 3.64 MIL/uL — ABNORMAL LOW (ref 4.22–5.81)
RDW: 12.2 % (ref 11.5–15.5)
WBC: 8.9 10*3/uL (ref 4.0–10.5)
nRBC: 0 % (ref 0.0–0.2)

## 2023-06-01 LAB — BASIC METABOLIC PANEL
Anion gap: 14 (ref 5–15)
BUN: 18 mg/dL (ref 8–23)
CO2: 23 mmol/L (ref 22–32)
Calcium: 9.6 mg/dL (ref 8.9–10.3)
Chloride: 106 mmol/L (ref 98–111)
Creatinine, Ser: 0.92 mg/dL (ref 0.61–1.24)
GFR, Estimated: >60 mL/min (ref >60)
Glucose, Bld: 91 mg/dL (ref 70–99)
Potassium: 4.1 mmol/L (ref 3.5–5.1)
Sodium: 143 mmol/L (ref 135–145)

## 2023-06-01 LAB — GLUCOSE, CAPILLARY
Glucose-Capillary: 106 mg/dL — ABNORMAL HIGH (ref 70–99)
Glucose-Capillary: 112 mg/dL — ABNORMAL HIGH (ref 70–99)
Glucose-Capillary: 133 mg/dL — ABNORMAL HIGH (ref 70–99)
Glucose-Capillary: 140 mg/dL — ABNORMAL HIGH (ref 70–99)
Glucose-Capillary: 53 mg/dL — ABNORMAL LOW (ref 70–99)
Glucose-Capillary: 83 mg/dL (ref 70–99)

## 2023-06-01 MED ORDER — HYDRALAZINE HCL 20 MG/ML IJ SOLN
10.0000 mg | INTRAMUSCULAR | Status: DC | PRN
Start: 2023-06-01 — End: 2023-06-09
  Administered 2023-06-03: 10 mg via INTRAVENOUS
  Filled 2023-06-01 (×2): qty 1

## 2023-06-01 MED ORDER — HYDRALAZINE HCL 20 MG/ML IJ SOLN
INTRAMUSCULAR | Status: AC
  Filled 2023-06-01: qty 1

## 2023-06-01 MED ORDER — DEXTROSE 50 % IV SOLN
INTRAVENOUS | Status: AC
  Administered 2023-06-01: 25 g via INTRAVENOUS
  Filled 2023-06-01: qty 50

## 2023-06-01 MED ORDER — DEXTROSE 50 % IV SOLN: 25.0000 g | INTRAVENOUS | Status: AC

## 2023-06-01 MED ORDER — HYDRALAZINE HCL 20 MG/ML IJ SOLN
5.0000 mg | INTRAMUSCULAR | Status: DC | PRN
  Administered 2023-06-01: 5 mg via INTRAVENOUS
  Filled 2023-06-01: qty 1

## 2023-06-01 MED ORDER — LORAZEPAM 2 MG/ML IJ SOLN
1.0000 mg | INTRAMUSCULAR | Status: DC | PRN
  Administered 2023-06-01 – 2023-06-02 (×2): 1 mg via INTRAVENOUS
  Filled 2023-06-01 (×2): qty 1

## 2023-06-01 NOTE — TOC Progression Note (Addendum)
 Transition of Care [facility]) - Progression Note    Patient Details  Name: Ralph [name] MRN: [id number] Date of Birth: Sep 21, 1950  Transition of Care [facility]) CM/SW Contact  Juliane Och, LCSW Phone Number: 06/01/2023, 8:50 AM  Clinical Narrative:     8:50 AM Per chart review, patient has been extubated and recommended to Outpatient Physical Therapy upon discharge, but is disoriented x3.  Expected Discharge Plan: OPPT Barriers to Discharge: Continued Medical Work up  Expected Discharge Plan and Services In-house Referral: Clinical Social Work Discharge Planning Services: CM Consult   Living arrangements for the past 2 months: Single Family Home                                       Social Determinants of Health (SDOH) Interventions SDOH Screenings   Food Insecurity: Patient Unable To Answer (05/25/2023)  Housing: Patient Unable To Answer (05/25/2023)  Transportation Needs: Patient Unable To Answer (05/25/2023)  Utilities: Patient Unable To Answer (05/25/2023)  Social Connections: Patient Unable To Answer (05/25/2023)  Tobacco Use: Medium Risk (05/26/2023)    Readmission Risk Interventions     No data to display

## 2023-06-01 NOTE — Progress Notes (Signed)
 eLink Physician-Brief Progress Note Patient Name: Ralph [name] DOB: Oct 25, 1950 MRN: [id number]   Date of Service  06/01/2023  HPI/Events of Note  Increased agitation. Trying to get out of bed  On phenobarb taper and ativan  q4h PRN to q2h PRN  eICU Interventions  If unable to control with  change of ativan  to q2h PRN, then advised to restart precedex  at lower doses if able     Intervention Category Minor Interventions: Agitation / anxiety - evaluation and management  [name] 06/01/2023, 10:58 PM

## 2023-06-01 NOTE — Progress Notes (Addendum)
 NAME:  Ralph [name], MRN:  [id number], DOB:  1950-05-24, LOS: 7 ADMISSION DATE:  05/25/2023, CONSULTATION DATE:  05/25/23 REFERRING MD:  Kermit Ped, EDP, CHIEF COMPLAINT:  respiratory arrest   History of Present Illness:  73 year old male with past medical history of hyperlipidemia, GERD, asthma, alcohol use who presented to the emergency department with respiratory arrest and unresponsiveness.  Per ED provider, significant other found him on the floor unresponsive about 30 minutes prior to arrival to the emergency department after a fall.  He required BVM assistance by EMS.  Additionally, significant other notes that he had a syncopal episode yesterday, had some coughing today. She states these episodes seemed to happen after he coughs, feels mildly short of breath "like his asthma is starting up" and then falls to the floor.    On my interview, significant other is at the bedside. She says he has otherwise been in his normal state of health. She denies him complaining of chest pain, shortness of breath, fever, n/v/d. No recent long flights or drives. No complaints of headache, nausea, visual changes, weakness. She notes yesterday he got out of the car, began coughing and lowered himself to the ground. States he felt like he had passed out but maybe not. Additionally, today, she states he was clearing his throat at the sink, and then called out for her. She states he was laying on the ground stating he felt like he was going to die. States he does drink alcohol daily, about "4 shots of liquor and a few beers." She denies him every having alcohol withdrawals. States his asthma is under control with his daily maintenance inhalers.   Pertinent  Medical History  Hyperlipidemia, GERD, asthma, alcohol use   Significant Hospital Events: Including procedures, antibiotic start and stop dates in addition to other pertinent events   2/3: Admit to CCM 2/4 extubated and transferred to floor 2/6 PCCM called back to  floor for etoh withdrawal symptoms. Intubated. 2/7 Extubated 2/8 remains delirious on Precedex  drip 2/10 remains delirious on Precedex  drip   Interim History / Subjective:  Continues to be delirious but sleeping after phenobarb and ativan  last night for agitation. Remains on Precedex  drip. CBG 53, got amp D50  Objective   Blood pressure (!) 161/56, pulse 63, temperature 98.5 F (36.9 C), temperature source Axillary, resp. rate (!) 22, height 5\' 11"  (1.803 m), weight 84.6 kg, SpO2 100%.        Intake/Output Summary (Last 24 hours) at 06/01/2023 4098 Last data filed at 06/01/2023 0418 Gross per 24 hour  Intake 254.8 ml  Output 600 ml  Net -345.2 ml   Filed Weights   05/29/23 0500 05/30/23 0500 05/31/23 0400  Weight: 84.6 kg 84.6 kg 84.6 kg    Examination: Gen: No acute distress, Chronically ill appearing Lungs: Clear to auscultation bilaterally; normal respiratory effort on room air CV: RRR Abd:  + bowel sounds; soft, non-tender; no palpable masses, no distension Ext: No edema; adequate peripheral perfusion Skin:  Warm and dry Neuro:  Sedated, unable to follow commands  Lab/imaging reviewed - BMP and CBC stable   Resolved Hospital Problem list     Assessment & Plan:  Alcohol withdrawal Acute agitation onset midnight on am 2/6, approximately 3 days after last drink before hospitalization. Intubated 2/6 for airway protection, extubated on 2/7. CIWA last was 3.  - Continue phenobarb taper through 2/11  - Wean Precedex  as tolerated, once more awake/alert, restart diet - On thiamine , folic acid , mtvn  -  Continue CIWA  - Ativan  1 mg PRN for agitation   Acute respiratory failure 2/2 above and inability to protect airway: resolved Asthma without exacerbation - changed from Breo/incruse to triple neb therapy.  - stable on room air now   Syncope: evaluated by neuology. Workup benign and they have signed off. - Supportive care   GERD - H2B   Deconditioning - PT if more  awake/alert this afternoon  Best Practice (right click and "Reselect all SmartList Selections" daily)   Diet/type: NPO, restart diet once more alert DVT prophylaxis: prophylactic heparin   Pressure ulcer(s): na GI prophylaxis: H2B Lines: N/A Foley:  No, external  Code Status:  full code Last date of multidisciplinary goals of care discussion [will need to update significant other]  Critical care time:

## 2023-06-01 NOTE — Plan of Care (Signed)
  Problem: Safety: Goal: Non-violent Restraint(s) Outcome: Not Progressing   Problem: Education: Goal: Ability to describe self-care measures that may prevent or decrease complications (Diabetes Survival Skills Education) will improve Outcome: Not Progressing

## 2023-06-01 NOTE — Evaluation (Signed)
 Physical Therapy Evaluation Patient Details Name: TANISHA EDELMAN MRN: [id number] DOB: April 14, 1951 Today's Date: 06/01/2023  History of Present Illness  73 Y/O male with respiratory arrest admitted 2/3, developed etoh withdrawal, DTs. Intubated 2/6-2/7.  PMHx: hyperlipidemia, GERD, asthma, alcohol use.   Clinical Impression  Limited progress since initial visit, in fact, pt required additional assist to safely transfer from bed to standing position today (Mod assist +2) and was unable to safely take any steps with RW at +2 assist. Heavy posterior lean and difficulty extending knees and hips in standing, great difficulty sequencing movements. More impulsive and restless. Able to re-direct easily but required redirection frequently. Goals downgraded for now; will need to make substantial progress to return home safely - Very likely to need post-acute rehab. Updated recs for CIR to assess for appropriateness. Will continue to progress as tolerate during admission.   (skin integrity, edema, etc.): HR109 at rest, elevated to 130s with standing. BP 156/86 in bed. Spo2 98-100% on RA.       If plan is discharge home, recommend the following: Assistance with cooking/housework;Direct supervision/assist for medications management;Direct supervision/assist for financial management;Assist for transportation;Supervision due to cognitive status;Two people to help with walking and/or transfers;Two people to help with bathing/dressing/bathroom   Can travel by private vehicle        Equipment Recommendations None recommended by PT  Recommendations for Other Services  Rehab consult    Functional Status Assessment       Precautions / Restrictions Precautions Precautions: Fall Restrictions Weight Bearing Restrictions Per Provider Order: No      Mobility  Bed Mobility Overal bed mobility: Needs Assistance Bed Mobility: Supine to Sit, Sit to Supine     Supine to sit: Min assist, HOB elevated,  Used rails Sit to supine: Mod assist, +2 for physical assistance, +2 for safety/equipment   General bed mobility comments: Min assist to rise to EOB. pt impulsive to lie down, poor awareness of alignment. Required mod assist To assist with trunk and LEs.    Transfers Overall transfer level: Needs assistance Equipment used: Rolling walker (2 wheels) Transfers: Sit to/from Stand Sit to Stand: Mod assist, +2 physical assistance, +2 safety/equipment, From elevated surface           General transfer comment: Mod assist +2 for boost and balance. Max cues for technique. Pushes RW away from BOS, leaning backwards, difficulty fully extending knees and hips. Tolerated for short periods of time before needing assist to sit down again. Impulsive to rise and sit despite multimodal cues.    Ambulation/Gait               General Gait Details: Unable to safely take any steps today.  Stairs            Wheelchair Mobility     Tilt Bed    Modified Rankin (Stroke Patients Only)       Balance                                             Pertinent Vitals/Pain Pain Assessment Pain Assessment: No/denies pain    Home Living                          Prior Function  Extremity/Trunk Assessment                Communication      Cognition Arousal: Alert Behavior During Therapy: Impulsive, Restless Overall Cognitive Status: Impaired/Different from baseline Area of Impairment: Orientation, Following commands, Safety/judgement, Problem solving                 Orientation Level: Disoriented to, Place, Time, Situation (Oriented to month, gave incorrect year several times after reorienting.)     Following Commands: Follows one step commands inconsistently, Follows one step commands with increased time Safety/Judgement: Decreased awareness of safety, Decreased awareness of deficits   Problem Solving: Slow  processing, Decreased initiation, Difficulty sequencing, Requires verbal cues, Requires tactile cues General Comments: Restless, trying to climb out of bed as PT entered room. Mitts and posey belt in place.        General Comments General comments (skin integrity, edema, etc.): HR109 at rest, elevated to 130s with standing. BP 156/86 in bed. Spo2 98-100% on RA.    Exercises     Assessment/Plan    PT Assessment    PT Problem List         PT Treatment Interventions      PT Goals (Current goals can be found in the Care Plan section)  Acute Rehab PT Goals Patient Stated Goal: none stated PT Goal Formulation: Patient unable to participate in goal setting Time For Goal Achievement: 06/12/23 Potential to Achieve Goals: Good    Frequency Min 1X/week     Co-evaluation               AM-PAC PT "6 Clicks" Mobility  Outcome Measure Help needed turning from your back to your side while in a flat bed without using bedrails?: A Little Help needed moving from lying on your back to sitting on the side of a flat bed without using bedrails?: A Little Help needed moving to and from a bed to a chair (including a wheelchair)?: A Lot Help needed standing up from a chair using your arms (e.g., wheelchair or bedside chair)?: A Lot Help needed to walk in hospital room?: Total Help needed climbing 3-5 steps with a railing? : Total 6 Click Score: 12    End of Session Equipment Utilized During Treatment: Gait belt Activity Tolerance: Other (comment);Patient limited by fatigue (Limited by restlessness/confusion) Patient left: in bed;with call bell/phone within reach;with bed alarm set;with family/visitor present   PT Visit Diagnosis: Unsteadiness on feet (R26.81);Difficulty in walking, not elsewhere classified (R26.2);Other symptoms and signs involving the nervous system (R29.898)    Time: 7846-9629 PT Time Calculation (min) (ACUTE ONLY): 21 min   Charges:     PT  Treatments $Therapeutic Activity: 8-22 mins PT General Charges $$ ACUTE PT VISIT: 1 Visit         Jory Ng, PT, DPT [facility]  Rehabilitation Services Physical Therapist Office: [phone] Website: [email or web address]   Alinda Irani 06/01/2023, 3:03 PM

## 2023-06-01 NOTE — Progress Notes (Signed)
 Inpatient Rehab Admissions Coordinator:   Per therapy recommendations,  patient was screened for CIR candidacy by Wandalee Gust, MS, CCC-SLP  At this time, Pt. is not medically ready for CIR. I will not pursue a rehab consult for this Pt. at this time, but CIR admissions team will follow and monitor for medical readiness and place consult order if Pt. appears to be an appropriate candidate. I would like to re-screen pt. Once fully off precedex .  Please contact me with any questions.   Wandalee Gust, MS, CCC-SLP Rehab Admissions Coordinator  [phone] (celll) [phone] (office)

## 2023-06-02 DIAGNOSIS — J9601 Acute respiratory failure with hypoxia: Secondary | ICD-10-CM | POA: Diagnosis not present

## 2023-06-02 DIAGNOSIS — F10939 Alcohol use, unspecified with withdrawal, unspecified: Secondary | ICD-10-CM | POA: Diagnosis not present

## 2023-06-02 LAB — GLUCOSE, CAPILLARY
Glucose-Capillary: 107 mg/dL — ABNORMAL HIGH (ref 70–99)
Glucose-Capillary: 124 mg/dL — ABNORMAL HIGH (ref 70–99)
Glucose-Capillary: 60 mg/dL — ABNORMAL LOW (ref 70–99)
Glucose-Capillary: 73 mg/dL (ref 70–99)
Glucose-Capillary: 98 mg/dL (ref 70–99)

## 2023-06-02 LAB — BASIC METABOLIC PANEL
Anion gap: 12 (ref 5–15)
BUN: 18 mg/dL (ref 8–23)
CO2: 22 mmol/L (ref 22–32)
Calcium: 9.3 mg/dL (ref 8.9–10.3)
Chloride: 107 mmol/L (ref 98–111)
Creatinine, Ser: 1.11 mg/dL (ref 0.61–1.24)
GFR, Estimated: >60 mL/min (ref >60)
Glucose, Bld: 93 mg/dL (ref 70–99)
Potassium: 3.7 mmol/L (ref 3.5–5.1)
Sodium: 141 mmol/L (ref 135–145)

## 2023-06-02 LAB — MAGNESIUM: Magnesium: 2 mg/dL (ref 1.7–2.4)

## 2023-06-02 MED ORDER — DIPHENHYDRAMINE HCL 50 MG/ML IJ SOLN
50.0000 mg | Freq: Every evening | INTRAMUSCULAR | Status: DC | PRN
  Administered 2023-06-02: 50 mg via INTRAVENOUS
  Filled 2023-06-02: qty 1

## 2023-06-02 MED ORDER — DEXTROSE 50 % IV SOLN
INTRAVENOUS | Status: AC
  Administered 2023-06-02: 12.5 g via INTRAVENOUS
  Filled 2023-06-02: qty 50

## 2023-06-02 MED ORDER — DEXTROSE 50 % IV SOLN: 12.5000 g | INTRAVENOUS | Status: AC

## 2023-06-02 MED ORDER — PHENOBARBITAL SODIUM 130 MG/ML IJ SOLN
130.0000 mg | Freq: Once | INTRAMUSCULAR | Status: AC
  Administered 2023-06-02: 130 mg via INTRAVENOUS
  Filled 2023-06-02: qty 1

## 2023-06-02 MED ORDER — ENSURE ENLIVE PO LIQD
237.0000 mL | Freq: Two times a day (BID) | ORAL | Status: DC
  Administered 2023-06-02 – 2023-06-09 (×13): 237 mL via ORAL

## 2023-06-02 MED ORDER — QUETIAPINE FUMARATE 25 MG PO TABS
25.0000 mg | ORAL_TABLET | Freq: Once | ORAL | Status: AC | PRN
  Administered 2023-06-02: 25 mg via ORAL
  Filled 2023-06-02: qty 1

## 2023-06-02 MED ORDER — LACTATED RINGERS IV BOLUS
500.0000 mL | Freq: Once | INTRAVENOUS | Status: DC
Start: 2023-06-02 — End: 2023-06-02

## 2023-06-02 MED ORDER — PHENOBARBITAL SODIUM 65 MG/ML IJ SOLN: 32.5000 mg | Freq: Three times a day (TID) | INTRAMUSCULAR | Status: DC

## 2023-06-02 MED ORDER — PHENOBARBITAL SODIUM 65 MG/ML IJ SOLN
65.0000 mg | Freq: Three times a day (TID) | INTRAMUSCULAR | Status: AC
  Administered 2023-06-02 – 2023-06-04 (×6): 65 mg via INTRAVENOUS
  Filled 2023-06-02 (×6): qty 1

## 2023-06-02 MED ORDER — QUETIAPINE FUMARATE 25 MG PO TABS
25.0000 mg | ORAL_TABLET | Freq: Every day | ORAL | Status: DC
  Administered 2023-06-02: 25 mg via ORAL
  Filled 2023-06-02: qty 1

## 2023-06-02 NOTE — Progress Notes (Addendum)
eLink Physician-Brief Progress Note Patient Name: Ralph [name] DOB: [birth year] MRN: [id number]   Date of Service  06/02/2023  HPI/Events of Note  Intermittently restless and agitated and trying to get out of bed.  eICU Interventions  Re-ordered posey belt Additional PRN seroquel 25 mg ordered if needed   11:30 PM Additional seroquel given. Still having hallucinations. CIWA 17. Phenobarb 130 mg once ordered.  Intervention Category Minor Interventions: Agitation / anxiety - evaluation and management  Rajendra Spiller Mechele Collin 06/02/2023, 10:05 PM

## 2023-06-02 NOTE — Progress Notes (Signed)
Nutrition Follow-up  DOCUMENTATION CODES:  Non-severe (moderate) malnutrition in context of chronic illness  INTERVENTION:  Advance diet to regular consistency / finger foods Pt with fluctuations in agitation and aggression, no metal utensils at this time. Once delirium clears can have normal utensils.  Feeding assistance while confused Ensure Enlive po BID, each supplement provides 350 kcal and 20 grams of protein. 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  GOAL:  Patient will meet greater than or equal to 90% of their needs  MONITOR:  TF tolerance, Labs  REASON FOR ASSESSMENT:  Ventilator, Consult Enteral/tube feeding initiation and management  ASSESSMENT:  Pt with hx of GERD, HLD, and EtOH abuse presented to ED in respiratory distress. Intubated shortly after arrival to ED.  2/3 - intubated, admitted to ICU extubated 2/4 - transferred out of ICU, began showing signs of withdrawal 2/6 - back to ICU, reintubated  2/7 - extubated  Pt resting in bed at the time of assessment. Awake and conversant, still a little confused. Pt able to take medications with no issues this AM. Breakfast tray noted at bedside untouched. On a full liquid diet at this time. MD ok to advance but there is some concern with pt's agitation and delirium that seems to be sudden onset. Requested that pt not receive metal utensils, add modifier in kitchen software but can be removed when delirium clears.   Admit weight: 97 kg ? accuracy  Current weight: 84.6 kg No recent weight hx available in chart   Intake/Output Summary (Last 24 hours) at 06/02/2023 1159 Last data filed at 06/02/2023 1000 Gross per 24 hour  Intake 874.12 ml  Output 600 ml  Net 274.12 ml  Net IO Since Admission: 733.68 mL [06/02/23 1159]  Drains/Lines: UOP 870  mL x 24 hours FMS placed 2/11  Average Meal Intake: 2/5: 100% intake x 2 recorded meals 2/10: 75% x 2 recorded meals  Nutritionally Relevant Medications: Scheduled Meds:  famotidine  20 mg Oral BID   Ensure Enlive  237 mL Oral BID BM   PROSource TF20  60 mL Per Tube Daily   folic acid  1 mg Oral Daily   multivitamin with minerals  1 tablet Oral Daily   PHENObarbital  65 mg Intravenous Q8H   polyethylene glycol  17 g Oral BID   senna-docusate  1 tablet Oral BID   VITAMIN B1  100 mg Intravenous Daily   Continuous Infusions:  dexmedetomidine (PRECEDEX) IV infusion 0.4 mcg/kg/hr (06/02/23 0800)   feeding supplement (OSMOLITE 1.5 CAL)     PRN Meds: polyethylene glycol  Labs Reviewed: CBG ranges from 53-140 mg/dL over the last 24 hours HgbA1c 5.2% (2/3)  NUTRITION - FOCUSED PHYSICAL EXAM: Flowsheet Row Most Recent Value  Orbital Region Mild depletion  Upper Arm Region Moderate depletion  Thoracic and Lumbar Region No depletion  Buccal Region Mild depletion  Temple Region No depletion  Clavicle Bone Region Mild depletion  Clavicle and Acromion Bone Region Moderate depletion  Scapular Bone Region Moderate depletion  Dorsal Hand No depletion  Patellar Region Moderate depletion  Anterior Thigh Region Moderate depletion  Posterior Calf Region Mild depletion  Edema (RD Assessment) None  Hair Reviewed  Eyes Reviewed  Mouth Reviewed  Skin Reviewed  Nails Reviewed    Diet Order:   Diet Order  DIET FINGER FOODS Room service appropriate? Yes; Fluid consistency: Thin  Diet effective now                   EDUCATION NEEDS:  Not appropriate for education at this time  Skin:  Skin Assessment: Reviewed RN Assessment  Last BM:  2/10  Height:  Ht Readings from Last 1 Encounters:  05/28/23 5\' 11"  (1.803 m)    Weight:  Wt Readings from Last 1 Encounters:  05/31/23 84.6 kg    Ideal Body Weight:  78.2 kg  BMI:  Body mass index is 26.01  kg/m.  Estimated Nutritional Needs:  Kcal:  1900-2100 kcal/d Protein:  100-120g/d Fluid:  1.9-2.1L/d    Greig Castilla, RD, LDN Registered Dietitian II Please reach out via secure chat Weekend on-call pager # available in [facility]

## 2023-06-02 NOTE — Progress Notes (Signed)
Elink contacted due to patient's increased agitation and delirium. Patient restless, confused, intermittently attempting to get out of bed, intermittently hallucinating, and intermittently yelling at staff. All scheduled and PRN medications given with little relief. Patient also complaining of 10/10 pain in his feet. This RN noted there to be erythema/redness in the lateral aspect of both feet. Patient screams in pain upon the area being touched. Suspect these to be pressure injuries related to restraint use. These finding also reported to [facility].  Patient currently in bed with bed alarm on and fall mats in place.   0010: Patient still highly agitated, yelling, trying to take off posey belt and mitts after giving extra 25mg  dose of Seroquel and 130mg  dose of phenobarb. Patient only oriented to person. Safety measures still in place.  Elink notified.   0400: This RN noted that left foot is more swollen. +1 pitting edema. Patient still complaining of pain in both feet but endorses greater pain in left foot. Elink aware.

## 2023-06-02 NOTE — Progress Notes (Addendum)
NAME:  Ralph [name], MRN:  [id number], DOB:  05-Nov-1950, LOS: 8 ADMISSION DATE:  05/25/2023, CONSULTATION DATE:  05/25/23 REFERRING MD:  Durwin Nora, EDP, CHIEF COMPLAINT:  respiratory arrest   History of Present Illness:  73 year old male with past medical history of hyperlipidemia, GERD, asthma, alcohol use who presented to the emergency department with respiratory arrest and unresponsiveness.  Per ED provider, significant other found him on the floor unresponsive about 30 minutes prior to arrival to the emergency department after a fall.  He required BVM assistance by EMS.  Additionally, significant other notes that he had a syncopal episode yesterday, had some coughing today. She states these episodes seemed to happen after he coughs, feels mildly short of breath "like his asthma is starting up" and then falls to the floor.    On my interview, significant other is at the bedside. She says he has otherwise been in his normal state of health. She denies him complaining of chest pain, shortness of breath, fever, n/v/d. No recent long flights or drives. No complaints of headache, nausea, visual changes, weakness. She notes yesterday he got out of the car, began coughing and lowered himself to the ground. States he felt like he had passed out but maybe not. Additionally, today, she states he was clearing his throat at the sink, and then called out for her. She states he was laying on the ground stating he felt like he was going to die. States he does drink alcohol daily, about "4 shots of liquor and a few beers." She denies him every having alcohol withdrawals. States his asthma is under control with his daily maintenance inhalers.   Pertinent  Medical History  Hyperlipidemia, GERD, asthma, alcohol use   Significant Hospital Events: Including procedures, antibiotic start and stop dates in addition to other pertinent events   2/3: Admit to CCM 2/4 extubated and transferred to floor 2/6 PCCM called back to  floor for etoh withdrawal symptoms. Intubated. 2/7 Extubated 2/8 remains delirious on Precedex drip 2/10 remains delirious on Precedex drip   Interim History / Subjective:  Somnolent but can be aroused. On Precedex still due to agitation not improved with Ativan.   Objective   Blood pressure (!) 93/50, pulse (!) 55, temperature 98.2 F (36.8 C), temperature source Axillary, resp. rate (!) 29, height 5\' 11"  (1.803 m), weight 84.6 kg, SpO2 100%.        Intake/Output Summary (Last 24 hours) at 06/02/2023 4098 Last data filed at 06/02/2023 1191 Gross per 24 hour  Intake 903.33 ml  Output 870 ml  Net 33.33 ml   Filed Weights   05/29/23 0500 05/30/23 0500 05/31/23 0400  Weight: 84.6 kg 84.6 kg 84.6 kg    Examination: Gen: No acute distress, Chronically ill appearing Lungs: Clear to auscultation bilaterally; normal respiratory effort on room air CV: mild bradycardia, normal rhythm Abd:  + bowel sounds; soft, non-tender; no distension Ext: No edema; adequate peripheral perfusion Skin:  Warm and dry Neuro:  Sedated, unable to follow commands  Lab/imaging reviewed - BMP and CBC stable   Resolved Hospital Problem list     Assessment & Plan:  Alcohol withdrawal Acute agitation onset midnight on am 2/6, approximately 3 days after last drink before hospitalization. Intubated 2/6 for airway protection, extubated on 2/7. CIWA overnight was 31-33, Precedex restarted.   - Continue phenobarb taper through 2/11  - Wean Precedex as tolerated - Add on Seroquel 25 mg at bedtime  - On thiamine, folic acid, mtvn  -  Continue CIWA  - Stopped Ativan PRN   Acute respiratory failure 2/2 above and inability to protect airway: resolved Asthma without exacerbation - changed from Breo/incruse to triple neb therapy.  - stable on room air now  Hypotension Notably this morning BP 80-90/50 with borderline MAP but improved. No signs of CHF on recent echo. Has not been on pressors.   - Keep MAP >65    Syncope: evaluated by neuology. Workup benign and they have signed off. - Supportive care   GERD - H2B   Deconditioning - Working with PT, possible CIR once more alert/awake  Best Practice (right click and "Reselect all SmartList Selections" daily)   Diet/type: FLD DVT prophylaxis: prophylactic heparin  Pressure ulcer(s): na GI prophylaxis: H2B Lines: N/A Foley:  No, external  Code Status:  full code Last date of multidisciplinary goals of care discussion [updated wife on 2/10]  Critical care time:

## 2023-06-02 NOTE — Progress Notes (Signed)
Patient continuing agitation and attempting to climb out of bed. No longer redirectable and pulling at all lines, cussing at staff. Gave an additional 1mg  of ativan per MD order.

## 2023-06-02 NOTE — Progress Notes (Signed)
Patient becoming more agitated, 1mg  of ativan given. Attempting to get out of bed with posey and pulling at lines.

## 2023-06-02 NOTE — Progress Notes (Signed)
2mg  of Ativan not sufficient in decreasing agitation. Patient hallucinating and pulling at lines, and trying to climb out of bed. Not redirectable and physically threatening staff. Precedex restarted.

## 2023-06-02 NOTE — TOC Progression Note (Signed)
Transition of Care [facility]) - Progression Note    Patient Details  Name: Ralph [name] MRN: [id number] Date of Birth: March 01, 1951  [facility] CM/SW Contact  Marliss Coots, LCSW Phone Number: 06/02/2023, 12:12 PM  Clinical Narrative:     12:12 PM Per progressions and chart review, patient has been screened by CIR for potential admission and will continue to follow for medical readiness. Patient is disoriented x4 and on restraints. Substance use resources will be offered upon patient's orientation improvement.  Expected Discharge Plan: IP Rehab Facility Barriers to Discharge: Continued Medical Work up  Expected Discharge Plan and Services In-house Referral: Clinical Social Work Discharge Planning Services: CM Consult   Living arrangements for the past 2 months: Single Family Home                                       Social Determinants of Health (SDOH) Interventions SDOH Screenings   Food Insecurity: Patient Unable To Answer (05/25/2023)  Housing: Patient Unable To Answer (05/25/2023)  Transportation Needs: Patient Unable To Answer (05/25/2023)  Utilities: Patient Unable To Answer (05/25/2023)  Social Connections: Patient Unable To Answer (05/25/2023)  Tobacco Use: Medium Risk (05/26/2023)    Readmission Risk Interventions     No data to display

## 2023-06-03 ENCOUNTER — Inpatient Hospital Stay (HOSPITAL_COMMUNITY): Payer: Medicare Other

## 2023-06-03 ENCOUNTER — Other Ambulatory Visit: Payer: Self-pay

## 2023-06-03 DIAGNOSIS — F10939 Alcohol use, unspecified with withdrawal, unspecified: Secondary | ICD-10-CM | POA: Diagnosis not present

## 2023-06-03 DIAGNOSIS — J9601 Acute respiratory failure with hypoxia: Secondary | ICD-10-CM | POA: Diagnosis not present

## 2023-06-03 LAB — AMMONIA: Ammonia: 20 umol/L (ref 9–35)

## 2023-06-03 LAB — CBC
HCT: 43.3 % (ref 39.0–52.0)
Hemoglobin: 15.3 g/dL (ref 13.0–17.0)
MCH: 36.3 pg — ABNORMAL HIGH (ref 26.0–34.0)
MCHC: 35.3 g/dL (ref 30.0–36.0)
MCV: 102.9 fL — ABNORMAL HIGH (ref 80.0–100.0)
Platelets: 193 10*3/uL (ref 150–400)
RBC: 4.21 MIL/uL — ABNORMAL LOW (ref 4.22–5.81)
RDW: 12 % (ref 11.5–15.5)
WBC: 9.9 10*3/uL (ref 4.0–10.5)
nRBC: 0 % (ref 0.0–0.2)

## 2023-06-03 LAB — GLUCOSE, CAPILLARY
Glucose-Capillary: 102 mg/dL — ABNORMAL HIGH (ref 70–99)
Glucose-Capillary: 112 mg/dL — ABNORMAL HIGH (ref 70–99)
Glucose-Capillary: 113 mg/dL — ABNORMAL HIGH (ref 70–99)
Glucose-Capillary: 121 mg/dL — ABNORMAL HIGH (ref 70–99)
Glucose-Capillary: 137 mg/dL — ABNORMAL HIGH (ref 70–99)
Glucose-Capillary: 82 mg/dL (ref 70–99)
Glucose-Capillary: 91 mg/dL (ref 70–99)

## 2023-06-03 LAB — HEPATIC FUNCTION PANEL
ALT: 56 U/L — ABNORMAL HIGH (ref 0–44)
AST: 43 U/L — ABNORMAL HIGH (ref 15–41)
Albumin: 3.5 g/dL (ref 3.5–5.0)
Alkaline Phosphatase: 57 U/L (ref 38–126)
Bilirubin, Direct: 0.3 mg/dL — ABNORMAL HIGH (ref 0.0–0.2)
Indirect Bilirubin: 0.8 mg/dL (ref 0.3–0.9)
Total Bilirubin: 1.1 mg/dL (ref 0.0–1.2)
Total Protein: 6.9 g/dL (ref 6.5–8.1)

## 2023-06-03 LAB — BASIC METABOLIC PANEL
Anion gap: 17 — ABNORMAL HIGH (ref 5–15)
BUN: 15 mg/dL (ref 8–23)
CO2: 20 mmol/L — ABNORMAL LOW (ref 22–32)
Calcium: 8.9 mg/dL (ref 8.9–10.3)
Chloride: 101 mmol/L (ref 98–111)
Creatinine, Ser: 0.92 mg/dL (ref 0.61–1.24)
GFR, Estimated: >60 mL/min (ref >60)
Glucose, Bld: 96 mg/dL (ref 70–99)
Potassium: 4.2 mmol/L (ref 3.5–5.1)
Sodium: 138 mmol/L (ref 135–145)

## 2023-06-03 MED ORDER — KETOROLAC TROMETHAMINE 15 MG/ML IJ SOLN
15.0000 mg | Freq: Once | INTRAMUSCULAR | Status: AC
  Administered 2023-06-03: 15 mg via INTRAVENOUS
  Filled 2023-06-03: qty 1

## 2023-06-03 MED ORDER — PHENOBARBITAL SODIUM 130 MG/ML IJ SOLN
130.0000 mg | Freq: Once | INTRAMUSCULAR | Status: AC
Start: 2023-06-03 — End: 2023-06-03
  Administered 2023-06-03: 130 mg via INTRAVENOUS
  Filled 2023-06-03: qty 1

## 2023-06-03 MED ORDER — QUETIAPINE FUMARATE 100 MG PO TABS
100.0000 mg | ORAL_TABLET | Freq: Every day | ORAL | Status: DC
  Administered 2023-06-03 – 2023-06-08 (×6): 100 mg via ORAL
  Filled 2023-06-03 (×6): qty 1

## 2023-06-03 MED ORDER — HALOPERIDOL LACTATE 5 MG/ML IJ SOLN: 5.0000 mg | Freq: Four times a day (QID) | INTRAMUSCULAR | Status: DC | PRN

## 2023-06-03 MED ORDER — HALOPERIDOL LACTATE 5 MG/ML IJ SOLN
5.0000 mg | Freq: Four times a day (QID) | INTRAMUSCULAR | Status: DC | PRN
  Administered 2023-06-03 – 2023-06-08 (×4): 5 mg via INTRAVENOUS
  Filled 2023-06-03 (×5): qty 1

## 2023-06-03 MED ORDER — PHENOBARBITAL 32.4 MG PO TABS
64.8000 mg | ORAL_TABLET | Freq: Once | ORAL | Status: AC
  Administered 2023-06-04: 64.8 mg via ORAL
  Filled 2023-06-03 (×2): qty 2

## 2023-06-03 MED ORDER — DICLOFENAC SODIUM 1 % EX GEL
4.0000 g | Freq: Four times a day (QID) | CUTANEOUS | Status: DC
  Administered 2023-06-03 – 2023-06-08 (×17): 4 g via TOPICAL
  Filled 2023-06-03 (×2): qty 100

## 2023-06-03 NOTE — Consult Note (Signed)
WOC Nurse Consult Note: Reason for Consult: foot wound  Reviewed chart and photos. [place] to bedside nursing Wound type: none Pressure Injury POA: NA Wound bed: redness on the bilateral lateral dorsal feet Drainage (amount, consistency, odor) NA Periwound:NA Dressing procedure/placement/frequency: No topical tx for red skin outside of monitoring, would recommend using prophylactic dressings under restraints if possible.    Re consult if needed, will not follow at this time. Thanks  Elorah Dewing [employer], RN,CWOCN, CNS, CWON-AP [phone])

## 2023-06-03 NOTE — Plan of Care (Signed)
Signed by [facility] RN

## 2023-06-03 NOTE — Progress Notes (Signed)
Patient's only IV access that was placed by IV team last night via [place] has infiltrated and had to be removed. Elink made aware of poor vasculature, multiple failed IVs over past few days, and need for PICC. STAT IV team consult made for new IV if able.   2245: CCM at bedside to assess for vascular access (see note).  2330: IV team able to place US guided IV in left forearm. IV flushing and drawing back blood at this time. PICC team not available/on site.  0240: US guided IV placed by IV team at 2330 infiltrated while giving scheduled IV Phenobarb. Pt did receive oral dose of oral phenobarb. NO IV access at this time. CCM aware of lack of access. PICC line ordered for morning when PICC team is on site.

## 2023-06-03 NOTE — Progress Notes (Signed)
Inpatient Rehab Admissions Coordinator:   At this time we are recommending a CIR consult and I will place an order per our protocol.   Estill Dooms, PT, DPT Admissions Coordinator [phone] 06/03/23  2:35 PM

## 2023-06-03 NOTE — Progress Notes (Signed)
eLink Physician-Brief Progress Note Patient Name: Ralph [name] DOB: 24-May-1950 MRN: [id number]   Date of Service  06/03/2023  HPI/Events of Note  64M with alcohol dependence who is admitted for acute alcohol withdrawal and acute hypoxemic respiratory failure. He was started on phenobarbital taper and precedex for withdrawal. He has been weaned to room air.  Social  Infiltrated his IV, has difficult IV access and has lost numerous IVs the last several days.  IV team consulted  eICU Interventions  Discussed with ground team of potential CVC placement   0108 - Renew Posey belt  Intervention Category Minor Interventions: Routine modifications to care plan (e.g. PRN medications for pain, fever)  Malikah Lakey 06/03/2023, 10:13 PM

## 2023-06-03 NOTE — Progress Notes (Signed)
 Physical Therapy Treatment Patient Details Name: Ralph [name] MRN: [id number] DOB: Jan 31, 1951 Today's Date: 06/03/2023   History of Present Illness 73 Y/O male with respiratory arrest admitted 2/3, developed etoh withdrawal, DTs. Intubated 2/6-2/7.  PMHx: hyperlipidemia, GERD, asthma, alcohol use.    PT Comments  Pt with agitation and attempts to get out of bed throughout the night requiring haldol dose, restraints, and posey belt. Pt initially lethargic however once assisted to EOB did open eyes and attempt to participate in therapy. Pt with impaired balance, inability to stand, impaired coordination, decreased insight to safety, situation and deficits, and requires assist for all mobility and adls at this time. PTA pt was indep and working. Continue to recommend inpatient rehab program > 3 hrs a day to address above deficits and progress towards independence. Acute PT to cont to follow.    If plan is discharge home, recommend the following: Assistance with cooking/housework;Direct supervision/assist for medications management;Direct supervision/assist for financial management;Assist for transportation;Supervision due to cognitive status;Two people to help with walking and/or transfers;Two people to help with bathing/dressing/bathroom   Can travel by private vehicle        Equipment Recommendations  None recommended by PT    Recommendations for Other Services Rehab consult     Precautions / Restrictions Precautions Precautions: Fall Precaution/Restrictions Comments: pt in posey belt Restrictions Weight Bearing Restrictions Per Provider Order: No     Mobility  Bed Mobility Overal bed mobility: Needs Assistance Bed Mobility: Supine to Sit, Sit to Supine     Supine to sit: HOB elevated, Used rails, +2 for physical assistance, Max assist Sit to supine: Mod assist, +2 for physical assistance, +2 for safety/equipment   General bed mobility comments: max directional verbal and  tactile cues, pt with no initiation requiring physical assist to complete    Transfers Overall transfer level: Needs assistance Equipment used: 2 person hand held assist Transfers: Sit to/from Stand Sit to Stand: Mod assist, +2 physical assistance, +2 safety/equipment, From elevated surface           General transfer comment: Mod assist +2 for boost and balance. Max cues for technique.verbal cues to push down into PT and RNs hands, posterior bias, narrow base of support, pt trying to step forward, unable to stand up right despite maxA physically and max directional cues. attempted 3x    Ambulation/Gait               General Gait Details: unable this date   Stairs             Wheelchair Mobility     Tilt Bed    Modified Rankin (Stroke Patients Only)       Balance Overall balance assessment: Needs assistance Sitting-balance support: Feet supported, Bilateral upper extremity supported Sitting balance-Leahy Scale: Poor Sitting balance - Comments: pt with no righting response, no protective response. required modA to maintain EOB balance with short bouts < 5 sec of close contact guard sitting EOB balance. Postural control: Other (comment) Standing balance support: During functional activity, Bilateral upper extremity supported Standing balance-Leahy Scale: Zero Standing balance comment: unable to achieve full stand                            Communication Communication Communication: Impaired Factors Affecting Communication: Reduced clarity of speech  Cognition Arousal: Lethargic Behavior During Therapy: Restless   PT - Cognitive impairments: No family/caregiver present to determine baseline, Safety/Judgement, Problem solving, Attention, Sequencing  PT - Cognition Comments: pt able to state name and birthdate, Ginette Otto, [place] but not hospital. initially lethargic and not following commands however once sitting up on  EOB, pt opened eyes and kept them open. pt able to follow simple commands majority of time ie. thumbs up, show me 2 fingers, kick Following commands: Impaired Following commands impaired: Follows one step commands inconsistently, Follows one step commands with increased time    Cueing Cueing Techniques: Verbal cues, Gestural cues, Tactile cues, Visual cues  Exercises      General Comments General comments (skin integrity, edema, etc.): VSS. bilat feet red and swollen.      Pertinent Vitals/Pain Pain Assessment Pain Assessment: Faces Faces Pain Scale: Hurts little more Pain Location: bilat feet with WBing Pain Descriptors / Indicators: Grimacing    Home Living                          Prior Function            PT Goals (current goals can now be found in the care plan section) Acute Rehab PT Goals Patient Stated Goal: none stated PT Goal Formulation: Patient unable to participate in goal setting Time For Goal Achievement: 06/12/23 Potential to Achieve Goals: Good Progress towards PT goals: Progressing toward goals    Frequency    Min 1X/week      PT Plan      Co-evaluation              AM-PAC PT "6 Clicks" Mobility   Outcome Measure  Help needed turning from your back to your side while in a flat bed without using bedrails?: A Little Help needed moving from lying on your back to sitting on the side of a flat bed without using bedrails?: A Little Help needed moving to and from a bed to a chair (including a wheelchair)?: A Lot Help needed standing up from a chair using your arms (e.g., wheelchair or bedside chair)?: A Lot Help needed to walk in hospital room?: Total Help needed climbing 3-5 steps with a railing? : Total 6 Click Score: 12    End of Session Equipment Utilized During Treatment: Gait belt Activity Tolerance: Other (comment);Patient limited by fatigue (Limited by restlessness/confusion) Patient left: in bed;with call bell/phone  within reach;with bed alarm set;with restraints reapplied (posey belt reapplied) Nurse Communication: Mobility status PT Visit Diagnosis: Unsteadiness on feet (R26.81);Difficulty in walking, not elsewhere classified (R26.2);Other symptoms and signs involving the nervous system (R29.898)     Time: 1610-9604 PT Time Calculation (min) (ACUTE ONLY): 22 min  Charges:    $Therapeutic Activity: 8-22 mins PT General Charges $$ ACUTE PT VISIT: 1 Visit                     Lewis Shock, PT, DPT Acute Rehabilitation Services Secure chat preferred Office #: [phone]    Iona Hansen 06/03/2023, 1:27 PM

## 2023-06-03 NOTE — Progress Notes (Signed)
PCCM Brief Note  Called to assess for IV access as prior IV's had infiltrated. Attempted US guided R forearm but pt moving a fair bit despite RN assistance holding arm. Attempted L EJ with US guidance and pt moved head and lifted head multiple times despite trying to talk him through things.  Unsafe for CVL placement given risks of pneumothorax and bleeding complications with pt not able to be still (also not ideal to have CVL for basic IV access in this scenario). Much more appropriate for PICC if able. Placed urgent PICC consult and RN calling them to see if they can accommodate.  Also discussed with Dr. Delia Chimes in [place]. Will give additional 65mg  PO Phenobarb now in attempts to keep pt calm throughout the night. He did receive 100mg  Seroquel earlier and is resting comfortably at the moment. If further anxiolytics/sedatives needed, Dr. Delia Chimes will consider additional PO Phenobarb vs IM benzos or haldol.  If no IV team tonight, then re-attempt first thing in AM.   Rutherford Guys, PA - [facility] For pager details, please see AMION or use Epic chat  After 1900, please call [facility] for cross coverage needs 06/03/2023, 10:45 PM

## 2023-06-03 NOTE — Progress Notes (Addendum)
NAME:  Ralph [name], MRN:  [id number], DOB:  [birth year], LOS: 9 ADMISSION DATE:  05/25/2023, CONSULTATION DATE:  05/25/23 REFERRING MD:  Durwin Nora, EDP, CHIEF COMPLAINT:  respiratory arrest   History of Present Illness:  73 year old male with past medical history of hyperlipidemia, GERD, asthma, alcohol use who presented to the emergency department with respiratory arrest and unresponsiveness.  Per ED provider, significant other found him on the floor unresponsive about 30 minutes prior to arrival to the emergency department after a fall.  He required BVM assistance by EMS.  Additionally, significant other notes that he had a syncopal episode yesterday, had some coughing today. She states these episodes seemed to happen after he coughs, feels mildly short of breath "like his asthma is starting up" and then falls to the floor.    On my interview, significant other is at the bedside. She says he has otherwise been in his normal state of health. She denies him complaining of chest pain, shortness of breath, fever, n/v/d. No recent long flights or drives. No complaints of headache, nausea, visual changes, weakness. She notes yesterday he got out of the car, began coughing and lowered himself to the ground. States he felt like he had passed out but maybe not. Additionally, today, she states he was clearing his throat at the sink, and then called out for her. She states he was laying on the ground stating he felt like he was going to die. States he does drink alcohol daily, about "4 shots of liquor and a few beers." She denies him every having alcohol withdrawals. States his asthma is under control with his daily maintenance inhalers.   Pertinent  Medical History  Hyperlipidemia, GERD, asthma, alcohol use   Significant Hospital Events: Including procedures, antibiotic start and stop dates in addition to other pertinent events   2/3: Admit to CCM 2/4 extubated and transferred to floor 2/6 PCCM called back to  floor for etoh withdrawal symptoms. Intubated. 2/7 Extubated 2/8 remains delirious on Precedex drip 2/10 remains delirious on Precedex drip  2/12 got extra dose phenobarb and haldol   Interim History / Subjective:  Awake oriented to self. Agitated overnight got phenobarb and haldol. Has feet pain.   Objective   Blood pressure 133/79, pulse 96, temperature 98 F (36.7 C), temperature source Oral, resp. rate (!) 28, height 5\' 11"  (1.803 m), weight 83.2 kg, SpO2 95%.        Intake/Output Summary (Last 24 hours) at 06/03/2023 0865 Last data filed at 06/03/2023 0300 Gross per 24 hour  Intake 34.97 ml  Output 800 ml  Net -765.03 ml   Filed Weights   05/30/23 0500 05/31/23 0400 06/03/23 0437  Weight: 84.6 kg 84.6 kg 83.2 kg    Examination: Gen: No acute distress, Chronically ill appearing Lungs: Clear to auscultation bilaterally; normal respiratory effort on room air CV: RRR Abd:  + bowel sounds; soft, non-tender; no distension Ext: erythema of dorsal bilateral feet, mild swelling and tenderness of L foot, no open wounds, bleeding or drainage; adequate peripheral perfusion Skin:  erythema of bilateral feet Neuro:  Awakes to voice, oriented to self, follows few commands  Lab/imaging reviewed - BMP with decreased bicarb and AG, CBC stable   Resolved Hospital Problem list     Assessment & Plan:  Alcohol withdrawal Hospital Delirium Acute agitation onset midnight on am 2/6, approximately 3 days after last drink before hospitalization. Intubated 2/6 for airway protection, extubated on 2/7. CIWA overnight was 14-18.  Got phenobarb  and haldol.  - Continue phenobarb taper through 2/11  - Increase Seroquel to 100 mg at bedtime  - Can add back Precedex tonight if agitated after Seroquel dose - Avoid further phenobarb, stopped PRN ativan  - On thiamine, folic acid, mtvn  - Continue CIWA  - Delirium precautions, keep awake/active during day   Acute respiratory failure 2/2 above and  inability to protect airway: resolved Asthma without exacerbation AGMA - changed from Breo/incruse to triple neb therapy.  - stable on room air now - Repeat CXR - Hepatic function panel - Ammonia    Syncope: evaluated by neuology. Workup benign and they have signed off. - Supportive care   GERD - H2B   Deconditioning - Working with PT, possible CIR once more alert/awake  Bilateral Foot Erythema  Stage 1 Pressure Injury  Erythema and mild swelling of bilateral feet, wonder if injury in bed while agitated against bed rails. If fevers or leukocytosis and concern for infectious process, could initiate abx.  - got bilateral foot xray  - no fever, no leukocytosis  - Prevalon boots   Best Practice (right click and "Reselect all SmartList Selections" daily)   Diet/type: finger foods DVT prophylaxis: prophylactic heparin  Pressure ulcer(s): na GI prophylaxis: H2B Lines: N/A Foley:  No, external  Code Status:  full code Last date of multidisciplinary goals of care discussion [updated wife on 2/10]  Critical care time:

## 2023-06-04 LAB — BASIC METABOLIC PANEL
Anion gap: 12 (ref 5–15)
BUN: 14 mg/dL (ref 8–23)
CO2: 22 mmol/L (ref 22–32)
Calcium: 8.9 mg/dL (ref 8.9–10.3)
Chloride: 104 mmol/L (ref 98–111)
Creatinine, Ser: 1.02 mg/dL (ref 0.61–1.24)
GFR, Estimated: >60 mL/min (ref >60)
Glucose, Bld: 120 mg/dL — ABNORMAL HIGH (ref 70–99)
Potassium: 3.1 mmol/L — ABNORMAL LOW (ref 3.5–5.1)
Sodium: 138 mmol/L (ref 135–145)

## 2023-06-04 LAB — CBC
HCT: 40.5 % (ref 39.0–52.0)
Hemoglobin: 14.3 g/dL (ref 13.0–17.0)
MCH: 36.5 pg — ABNORMAL HIGH (ref 26.0–34.0)
MCHC: 35.3 g/dL (ref 30.0–36.0)
MCV: 103.3 fL — ABNORMAL HIGH (ref 80.0–100.0)
Platelets: 250 10*3/uL (ref 150–400)
RBC: 3.92 MIL/uL — ABNORMAL LOW (ref 4.22–5.81)
RDW: 11.9 % (ref 11.5–15.5)
WBC: 7.4 10*3/uL (ref 4.0–10.5)
nRBC: 0 % (ref 0.0–0.2)

## 2023-06-04 LAB — GLUCOSE, CAPILLARY
Glucose-Capillary: 105 mg/dL — ABNORMAL HIGH (ref 70–99)
Glucose-Capillary: 100 mg/dL — ABNORMAL HIGH (ref 70–99)
Glucose-Capillary: 97 mg/dL (ref 70–99)
Glucose-Capillary: 107 mg/dL — ABNORMAL HIGH (ref 70–99)
Glucose-Capillary: 158 mg/dL — ABNORMAL HIGH (ref 70–99)
Glucose-Capillary: 115 mg/dL — ABNORMAL HIGH (ref 70–99)

## 2023-06-04 LAB — MAGNESIUM: Magnesium: 1.8 mg/dL (ref 1.7–2.4)

## 2023-06-04 MED ORDER — SODIUM CHLORIDE 0.9% FLUSH
10.0000 mL | Freq: Two times a day (BID) | INTRAVENOUS | Status: DC
Start: 2023-06-04 — End: 2023-06-09
  Administered 2023-06-04: 20 mL
  Administered 2023-06-05 – 2023-06-09 (×9): 10 mL

## 2023-06-04 MED ORDER — SODIUM CHLORIDE 0.9% FLUSH: 10.0000 mL | INTRAVENOUS | Status: DC | PRN

## 2023-06-04 MED ORDER — PHENOBARBITAL 32.4 MG PO TABS
32.4000 mg | ORAL_TABLET | Freq: Three times a day (TID) | ORAL | Status: AC
  Administered 2023-06-04 – 2023-06-05 (×6): 32.4 mg via ORAL
  Filled 2023-06-04 (×6): qty 1

## 2023-06-04 MED ORDER — THIAMINE MONONITRATE 100 MG PO TABS
100.0000 mg | ORAL_TABLET | Freq: Every day | ORAL | Status: DC
  Administered 2023-06-04 – 2023-06-09 (×6): 100 mg via ORAL
  Filled 2023-06-04 (×6): qty 1

## 2023-06-04 MED ORDER — POTASSIUM CHLORIDE CRYS ER 20 MEQ PO TBCR
40.0000 meq | EXTENDED_RELEASE_TABLET | ORAL | Status: AC
  Administered 2023-06-04 (×2): 40 meq via ORAL
  Filled 2023-06-04 (×2): qty 2

## 2023-06-04 MED ORDER — PANTOPRAZOLE SODIUM 40 MG PO TBEC
40.0000 mg | DELAYED_RELEASE_TABLET | Freq: Every day | ORAL | Status: DC
  Administered 2023-06-05 – 2023-06-09 (×5): 40 mg via ORAL
  Filled 2023-06-04 (×5): qty 1

## 2023-06-04 NOTE — Progress Notes (Signed)
Spoke with Francene Finders RN regarding PICC placement order. RN to verify if PICC is still needed at this time.

## 2023-06-04 NOTE — Progress Notes (Addendum)
NAME:  Ralph [name], MRN:  [id number], DOB:  11/10/1950, LOS: 10 ADMISSION DATE:  05/25/2023, CONSULTATION DATE:  05/25/23 REFERRING MD:  Durwin Nora, EDP, CHIEF COMPLAINT:  respiratory arrest   History of Present Illness:  73 year old male with past medical history of hyperlipidemia, GERD, asthma, alcohol use who presented to the emergency department with respiratory arrest and unresponsiveness.  Per ED provider, significant other found him on the floor unresponsive about 30 minutes prior to arrival to the emergency department after a fall.  He required BVM assistance by EMS.  Additionally, significant other notes that he had a syncopal episode yesterday, had some coughing today. She states these episodes seemed to happen after he coughs, feels mildly short of breath "like his asthma is starting up" and then falls to the floor.    On my interview, significant other is at the bedside. She says he has otherwise been in his normal state of health. She denies him complaining of chest pain, shortness of breath, fever, n/v/d. No recent long flights or drives. No complaints of headache, nausea, visual changes, weakness. She notes yesterday he got out of the car, began coughing and lowered himself to the ground. States he felt like he had passed out but maybe not. Additionally, today, she states he was clearing his throat at the sink, and then called out for her. She states he was laying on the ground stating he felt like he was going to die. States he does drink alcohol daily, about "4 shots of liquor and a few beers." She denies him every having alcohol withdrawals. States his asthma is under control with his daily maintenance inhalers.   Pertinent  Medical History  Hyperlipidemia, GERD, asthma, alcohol use   Significant Hospital Events: Including procedures, antibiotic start and stop dates in addition to other pertinent events   2/3: Admit to CCM 2/4 extubated and transferred to floor 2/6 PCCM called back  to floor for etoh withdrawal symptoms. Intubated. 2/7 Extubated 2/8 remains delirious on Precedex drip 2/10 remains delirious on Precedex drip  2/12 got extra dose phenobarb and haldol, off precedex  Interim History / Subjective:  Awake oriented to self and place. Denies any new pains or concerns.   Objective   Blood pressure (!) 110/59, pulse 72, temperature 98.3 F (36.8 C), temperature source Oral, resp. rate (!) 24, height 5\' 11"  (1.803 m), weight 85.8 kg, SpO2 98%.        Intake/Output Summary (Last 24 hours) at 06/04/2023 4098 Last data filed at 06/04/2023 0212 Gross per 24 hour  Intake --  Output 700 ml  Net -700 ml   Filed Weights   05/31/23 0400 06/03/23 0437 06/04/23 0500  Weight: 84.6 kg 83.2 kg 85.8 kg    Examination: Gen: No acute distress, Chronically ill appearing Lungs: Clear to auscultation bilaterally; normal respiratory effort on room air CV: RRR Abd:  + bowel sounds; soft, non-tender; no distension Ext: erythema of dorsal bilateral feet, mild swelling and tenderness of L foot, no open wounds, bleeding or drainage; adequate peripheral perfusion Skin:  improved erythema of bilateral feet Neuro:  Alert and oriented to self and place not year, follows few commands  Lab/imaging reviewed - BMP low K, CBC stable   Resolved Hospital Problem list     Assessment & Plan:  Alcohol withdrawal Hospital Delirium Acute agitation onset midnight on am 2/6, approximately 3 days after last drink before hospitalization. Intubated 2/6 for airway protection, extubated on 2/7. CIWA overnight was 4-10.  -  Complete phenobarb taper  - Continue Seroquel 100 mg at bedtime  - No further Precedex  - On thiamine, folic acid, mtvn  - Continue CIWA  - Delirium precautions, keep awake/active during day  - Can consider transfer from ICU given improvement, may need 1:1 sitter  Acute respiratory failure 2/2 above and inability to protect airway: resolved Asthma without  exacerbation AGMA - changed from Breo/incruse to triple neb therapy.  - stable on room air now - Repeat CXR with bibasilar atelectasis vs mild edema - IS - Hepatic function panel minimal elevated of LFTs - Ammonia normal   Syncope: evaluated by neuology. Workup benign and they have signed off. - Supportive care   GERD - H2B   Deconditioning - Working with PT, possible CIR evaluation  Bilateral Foot Erythema  Stage 1 Pressure Injury  Erythema and mild swelling of bilateral feet, wonder if injury in bed while agitated against bed rails. If fevers or leukocytosis and concern for infectious process, could initiate abx. Xray showed remote fractures bilaterally. - no fever, no leukocytosis  - tylenol PRN and voltaren gel QID - Prevalon boots   Best Practice (right click and "Reselect all SmartList Selections" daily)   Diet/type: finger foods DVT prophylaxis: prophylactic heparin  Pressure ulcer(s): na GI prophylaxis: H2B Lines: N/A Foley:  No, external  Code Status:  full code Last date of multidisciplinary goals of care discussion [updated wife and daughter on 2/12]  Critical care time:

## 2023-06-04 NOTE — Progress Notes (Signed)
eLink Physician-Brief Progress Note Patient Name: Ralph [name] DOB: November 18, 1950 MRN: [id number]   Date of Service  06/04/2023  HPI/Events of Note  73M with alcohol dependence who is admitted for acute alcohol withdrawal and acute hypoxemic respiratory failure. He was started on phenobarbital taper and precedex for withdrawal. He has been weaned to room air and weaned off precedex.   Ongoing severe agitation  eICU Interventions  Renew safety precautions-TeleSitter     Intervention Category Minor Interventions: Routine modifications to care plan (e.g. PRN medications for pain, fever)  Oluwadarasimi Redmon 06/04/2023, 10:00 PM

## 2023-06-04 NOTE — Progress Notes (Signed)
All vessels in bilateral forearm are noncompressible.  Spoke with bedside RN and attending on unit regarding PICC versus midline.  Doctor would like to have PICC placed.

## 2023-06-04 NOTE — Progress Notes (Signed)
Peripherally Inserted Central Catheter Placement  The IV Nurse has discussed with the patient and/or persons authorized to consent for the patient, the purpose of this procedure and the potential benefits and risks involved with this procedure.  The benefits include less needle sticks, lab draws from the catheter, and the patient may be discharged home with the catheter. Risks include, but not limited to, infection, bleeding, blood clot (thrombus formation), and puncture of an artery; nerve damage and irregular heartbeat and possibility to perform a PICC exchange if needed/ordered by physician.  Alternatives to this procedure were also discussed.  Bard Power PICC patient education guide, fact sheet on infection prevention and patient information card has been provided to patient /or left at bedside.  Telephone consent obtained from [facility], significant other, due to altered mental status.  PICC Placement Documentation  PICC Double Lumen 06/04/23 Right Brachial 46 cm 0 cm (Active)  Indication for Insertion or Continuance of Line Poor Vasculature-patient has had multiple peripheral attempts or PIVs lasting less than 24 hours 06/04/23 1635  Exposed Catheter (cm) 0 cm 06/04/23 1635  Site Assessment Clean, Dry, Intact 06/04/23 1635  Lumen #1 Status Saline locked;Flushed;Blood return noted 06/04/23 1635  Lumen #2 Status Saline locked;Flushed;Blood return noted 06/04/23 1635  Dressing Type Transparent;Securing device 06/04/23 1635  Dressing Status Antimicrobial disc/dressing in place;Clean, Dry, Intact 06/04/23 1635  Line Care Connections checked and tightened 06/04/23 1635  Line Adjustment (NICU/IV Team Only) No 06/04/23 1635  Dressing Intervention New dressing;Adhesive placed at insertion site (IV team only) 06/04/23 1635  Dressing Change Due 06/11/23 06/04/23 1635       Jasdeep Kepner, Lajean Manes 06/04/2023, 4:36 PM

## 2023-06-04 NOTE — Progress Notes (Signed)
[facility] ADULT ICU REPLACEMENT PROTOCOL   The patient does apply for the [facility] Adult ICU Electrolyte Replacment Protocol based on the criteria listed below:   1.Exclusion criteria: TCTS, ECMO, Dialysis, and Myasthenia Gravis patients 2. Is GFR >/= 30 ml/min? Yes.    Patient's GFR today is >60 3. Is SCr </= 2? Yes.   Patient's SCr is 1.02 mg/dL 4. Did SCr increase >/= 0.5 in 24 hours? No. 5.Pt's weight >40kg  Yes.   6. Abnormal electrolyte(s): Potassium  7. Electrolytes replaced per protocol 8.  Call MD STAT for K+ </= 2.5, Phos </= 1, or Mag </= 1 Physician:  Dr. Faye Ramsay A Ralph [name] 06/04/2023 4:04 AM

## 2023-06-05 ENCOUNTER — Encounter (HOSPITAL_COMMUNITY): Payer: Self-pay | Admitting: Critical Care Medicine

## 2023-06-05 DIAGNOSIS — R092 Respiratory arrest: Secondary | ICD-10-CM | POA: Diagnosis not present

## 2023-06-05 LAB — BASIC METABOLIC PANEL
Anion gap: 9 (ref 5–15)
BUN: 13 mg/dL (ref 8–23)
CO2: 27 mmol/L (ref 22–32)
Calcium: 9.1 mg/dL (ref 8.9–10.3)
Chloride: 104 mmol/L (ref 98–111)
Creatinine, Ser: 0.94 mg/dL (ref 0.61–1.24)
GFR, Estimated: >60 mL/min (ref >60)
Glucose, Bld: 100 mg/dL — ABNORMAL HIGH (ref 70–99)
Potassium: 4.3 mmol/L (ref 3.5–5.1)
Sodium: 140 mmol/L (ref 135–145)

## 2023-06-05 LAB — GLUCOSE, CAPILLARY
Glucose-Capillary: 89 mg/dL (ref 70–99)
Glucose-Capillary: 117 mg/dL — ABNORMAL HIGH (ref 70–99)
Glucose-Capillary: 111 mg/dL — ABNORMAL HIGH (ref 70–99)
Glucose-Capillary: 105 mg/dL — ABNORMAL HIGH (ref 70–99)
Glucose-Capillary: 150 mg/dL — ABNORMAL HIGH (ref 70–99)

## 2023-06-05 MED ORDER — LORAZEPAM 1 MG PO TABS
2.0000 mg | ORAL_TABLET | Freq: Once | ORAL | Status: AC
  Administered 2023-06-05: 2 mg via ORAL
  Filled 2023-06-05: qty 2

## 2023-06-05 NOTE — TOC Progression Note (Signed)
Transition of Care [facility]) - Progression Note    Patient Details  Name: Ralph [name] MRN: [id number] Date of Birth: 1951/03/05  [facility] CM/SW Contact  Marliss Coots, LCSW Phone Number: 06/05/2023, 12:04 PM  Clinical Narrative:     12:04 PM Per progressions, patient remains not fully oriented but is off restraints and is followed by CIR for possible admission.  Expected Discharge Plan: IP Rehab Facility Barriers to Discharge: Continued Medical Work up  Expected Discharge Plan and Services In-house Referral: Clinical Social Work Discharge Planning Services: CM Consult   Living arrangements for the past 2 months: Single Family Home                                       Social Determinants of Health (SDOH) Interventions SDOH Screenings   Food Insecurity: Patient Unable To Answer (05/25/2023)  Housing: Patient Unable To Answer (05/25/2023)  Transportation Needs: Patient Unable To Answer (05/25/2023)  Utilities: Patient Unable To Answer (05/25/2023)  Social Connections: Patient Unable To Answer (05/25/2023)  Tobacco Use: Medium Risk (05/26/2023)    Readmission Risk Interventions     No data to display

## 2023-06-05 NOTE — Progress Notes (Signed)
PROGRESS NOTE    Ralph [name]  ZOX:[id number] DOB: 11/06/1950 DOA: 05/25/2023 PCP: Sigmund Hazel, MD   Brief Narrative: This 73 year old male with PMH significant for hyperlipidemia, GERD, asthma, alcohol use disorder who was brought by family , He was found unresponsive. He was in respiratory distress. He required BVM assistance by EMS.  Significant other notes that he had a syncopal episode one day prior, had some coughing . She states these episodes seemed to happen after he coughs, feels mildly short of breath "like his asthma is starting up" and then falls to the floor.  Patient had similar episode, he felt like he was going to die.  Patient drinks alcohol daily,  he denies any recent alcohol withdrawals. He reports asthma is under control with his daily maintenance medication.  Patient was intubated for airway protection and admitted in the ICU.  He required Precedex for agitation.  Successfully extubated now remains on phenobarb taper and Seroquel. TRH pickup 06/05/2023.  Assessment & Plan:   Principal Problem:   Respiratory arrest (HCC) Active Problems:   Malnutrition of moderate degree  Alcohol withdrawal: Hospital delirium: Patient presented with agitation and respiratory distress requiring intubation. Patient became acutely agitated on the night of 2/6, approximately 3 days after last drink. Intubated 2/6 for airway protection, extubated on 2/7. CIWA overnight was 4-10.  Complete phenobarb taper. Continue Seroquel 100 mg at bedtime  Successfully off Precedex  Continue thiamine, folic acid, mtvn  Continue CIWA  Delirium precautions, keep awake/active during day     Acute hypoxic respiratory failure requiring mechanical ventilation: Asthma without exacerbation He was changed from Breo/incruse to triple neb therapy.  Now remains on room air. Repeat CXR with bibasilar atelectasis vs mild edema Continue inspiratory spirometry and flutter. Hepatic function panel minimal  elevated of LFTs -Ammonia normal   Syncope:  He was evaluated by Neurology.  Workup benign and they have signed off. Continue Supportive care   GERD: Continue pantoprazole.   Deconditioning: Working with PT, possible CIR evaluation   Bilateral Foot Erythema /  Pressure Injury Stage I Erythema and mild swelling of bilateral feet, wonder if injury in bed while agitated against bed rails. If fevers or leukocytosis and concern for infectious process, could initiate abx. Xray showed remote fractures bilaterally. No fever, No leukocytosis  Continue tylenol PRN and voltaren gel QID Prevalon boots     DVT prophylaxis: Heparin Code Status: Full code Family Communication: No family at bedside Disposition Plan:   Status is: Inpatient Remains inpatient appropriate because: Severity of illness  Consultants:  PCCM  Procedures: Intubated > extubated.  Antimicrobials: Anti-infectives (From admission, onward)    Start     Dose/Rate Route Frequency Ordered Stop   05/28/23 1000  imipenem-cilastatin (PRIMAXIN) 500 mg in sodium chloride 0.9 % 100 mL IVPB  Status:  Discontinued        500 mg 200 mL/hr over 30 Minutes Intravenous Every 12 hours 05/28/23 0737 05/28/23 0738   05/25/23 1545  doxycycline (VIBRA-TABS) tablet 100 mg  Status:  Discontinued        100 mg Oral Every 12 hours 05/25/23 1446 05/26/23 1131      Subjective: Patient was seen and examined at bedside.  Overnight events noted.   Patient seems much improved. Denies any withdrawal symptoms.   Patient remains on phenobarbital taper and CIWA protocol.  Objective: Vitals:   06/05/23 0800 06/05/23 0900 06/05/23 1000 06/05/23 1056  BP: 128/71 (!) 136/93 (!) 143/92   Pulse: 91 85 80  Resp: (!) 24 (!) 27 16   Temp:    98.5 F (36.9 C)  TempSrc:    Oral  SpO2: 95% 97% 95%   Weight:      Height:        Intake/Output Summary (Last 24 hours) at 06/05/2023 1122 Last data filed at 06/05/2023 0554 Gross per 24 hour  Intake  240 ml  Output 1500 ml  Net -1260 ml   Filed Weights   05/31/23 0400 06/03/23 0437 06/04/23 0500  Weight: 84.6 kg 83.2 kg 85.8 kg    Examination:  General exam: Appears calm and comfortable, not in any acute distress. Respiratory system: CTA Bilaterally . Respiratory effort normal.  RR 15 Cardiovascular system: S1 & S2 heard, RRR. No JVD, murmurs, rubs, gallops or clicks.  Gastrointestinal system: Abdomen is non distended, soft and non tender. Normal bowel sounds heard. Central nervous system: Alert and oriented x 3. No focal neurological deficits. Extremities: No edema, no cyanosis, no clubbing. Skin: No rashes, lesions or ulcers Psychiatry: Judgement and insight appear normal. Mood & affect appropriate.     Data Reviewed: I have personally reviewed following labs and imaging studies  CBC: Recent Labs  Lab 05/30/23 0247 05/31/23 0632 06/01/23 0254 06/03/23 0343 06/04/23 0211  WBC 8.3 8.7 8.9 9.9 7.4  HGB 13.4 13.5 13.0 15.3 14.3  HCT 39.0 37.3* 38.3* 43.3 40.5  MCV 105.7* 103.6* 105.2* 102.9* 103.3*  PLT 158 186 181 193 250   Basic Metabolic Panel: Recent Labs  Lab 05/31/23 0632 06/01/23 0254 06/02/23 0632 06/02/23 0839 06/03/23 0343 06/04/23 0211 06/05/23 0317  NA 141 143  --  141 138 138 140  K 3.7 4.1  --  3.7 4.2 3.1* 4.3  CL 109 106  --  107 101 104 104  CO2 20* 23  --  22 20* 22 27  GLUCOSE 106* 91  --  93 96 120* 100*  BUN 20 18  --  18 15 14 13   CREATININE 0.87 0.92  --  1.11 0.92 1.02 0.94  CALCIUM 9.3 9.6  --  9.3 8.9 8.9 9.1  MG 1.9  --  2.0  --   --  1.8  --    GFR: Estimated Creatinine Clearance: 75.7 mL/min (by C-G formula based on SCr of 0.94 mg/dL). Liver Function Tests: Recent Labs  Lab 06/03/23 1041  AST 43*  ALT 56*  ALKPHOS 57  BILITOT 1.1  PROT 6.9  ALBUMIN 3.5   No results for input(s): "LIPASE", "AMYLASE" in the last 168 hours. Recent Labs  Lab 06/03/23 1041  AMMONIA 20   Coagulation Profile: No results for  input(s): "INR", "PROTIME" in the last 168 hours. Cardiac Enzymes: No results for input(s): "CKTOTAL", "CKMB", "CKMBINDEX", "TROPONINI" in the last 168 hours. BNP (last 3 results) No results for input(s): "PROBNP" in the last 8760 hours. HbA1C: No results for input(s): "HGBA1C" in the last 72 hours. CBG: Recent Labs  Lab 06/04/23 1911 06/04/23 2259 06/05/23 0307 06/05/23 0710 06/05/23 1059  GLUCAP 158* 115* 89 117* 111*   Lipid Profile: No results for input(s): "CHOL", "HDL", "LDLCALC", "TRIG", "CHOLHDL", "LDLDIRECT" in the last 72 hours. Thyroid Function Tests: No results for input(s): "TSH", "T4TOTAL", "FREET4", "T3FREE", "THYROIDAB" in the last 72 hours. Anemia Panel: No results for input(s): "VITAMINB12", "FOLATE", "FERRITIN", "TIBC", "IRON", "RETICCTPCT" in the last 72 hours. Sepsis Labs: No results for input(s): "PROCALCITON", "LATICACIDVEN" in the last 168 hours.  No results found for this or any previous visit (from  the past 240 hours).   Radiology Studies: [place] EKG SITE RITE Result Date: 06/03/2023 If [facility] image not attached, placement could not be confirmed due to current cardiac rhythm.  Scheduled Meds:  arformoterol  15 mcg Nebulization BID   budesonide (PULMICORT) nebulizer solution  0.5 mg Nebulization BID   Chlorhexidine Gluconate Cloth  6 each Topical Daily   diclofenac Sodium  4 g Topical QID   escitalopram  20 mg Oral Daily   feeding supplement  237 mL Oral BID BM   folic acid  1 mg Oral Daily   heparin  5,000 Units Subcutaneous Q8H   multivitamin with minerals  1 tablet Oral Daily   pantoprazole  40 mg Oral Daily   phenobarbital  32.4 mg Oral TID   pneumococcal 20-valent conjugate vaccine  0.5 mL Intramuscular Tomorrow-1000   polyethylene glycol  17 g Oral BID   QUEtiapine  100 mg Oral QHS   revefenacin  175 mcg Nebulization Daily   senna-docusate  1 tablet Oral BID   sodium chloride flush  10-40 mL Intracatheter Q12H   thiamine  100 mg Oral  Daily   Continuous Infusions:   LOS: 11 days    Time spent: 50 mins    Willeen Niece, MD [facility]   If 7PM-7AM, please contact night-coverage

## 2023-06-05 NOTE — Evaluation (Signed)
Occupational Therapy Evaluation Patient Details Name: Ralph [name] MRN: [id number] DOB: Jun 17, 1950 Today's Date: 06/05/2023   History of Present Illness   73 Y/O male with respiratory arrest admitted 2/3, developed etoh withdrawal, DTs. Intubated 2/6-2/7.  PMHx: hyperlipidemia, GERD, asthma, alcohol use.     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 Patient reports full independence in ADLs and functional mobility. Patient presenting with cognitive impairment, impulsivity, up to mod A for functional mobility with RW and ADL management. Patient tangential throughout OT evaluation, and requiring cues to maintain attention to task. OT recommending intensive therapy > 3 hours prior to discharge home. OT will follow.      If plan is discharge home, recommend the following:   A lot of help with walking and/or transfers;A lot of help with bathing/dressing/bathroom;Assistance with cooking/housework;Direct supervision/assist for financial management;Direct supervision/assist for medications management;Assist for transportation;Help with stairs or ramp for entrance;Supervision due to cognitive status     Functional Status Assessment   Patient has had a recent decline in their functional status and demonstrates the ability to make significant improvements in function in a reasonable and predictable amount of time.     Equipment Recommendations   Other (comment) (defer to next venue)     Recommendations for Other Services         Precautions/Restrictions   Precautions Precautions: Fall Restrictions Weight Bearing Restrictions Per Provider Order: No     Mobility Bed Mobility Overal bed mobility: Needs Assistance Bed Mobility: Sit to Supine           General bed mobility comments: sitting EOB upon arrival, increased cues to return to supine position    Transfers Overall transfer level: Needs  assistance Equipment used: Rolling walker (2 wheels) Transfers: Sit to/from Stand Sit to Stand: Min assist, +2 physical assistance, +2 safety/equipment           General transfer comment: requiring +2 min A at times due to R lateral and posterior lean decreased awareness noted throughout      Balance Overall balance assessment: Needs assistance Sitting-balance support: Feet supported, Bilateral upper extremity supported Sitting balance-Leahy Scale: Fair Sitting balance - Comments: sitting EOB but could not accept challenge   Standing balance support: Reliant on assistive device for balance, During functional activity, Bilateral upper extremity supported Standing balance-Leahy Scale: Poor Standing balance comment: reliant on RW and up to mod A from PT or OT                           ADL either performed or assessed with clinical judgement   ADL Overall ADL's : Needs assistance/impaired Eating/Feeding: Set up;Sitting   Grooming: Set up;Sitting   Upper Body Bathing: Minimal assistance;Sitting   Lower Body Bathing: Moderate assistance;Sitting/lateral leans;Sit to/from stand;Maximal assistance   Upper Body Dressing : Minimal assistance;Sitting   Lower Body Dressing: Moderate assistance;Maximal assistance;Sitting/lateral leans;Sit to/from stand   Toilet Transfer: Moderate assistance;Ambulation;Rolling walker (2 wheels) Toilet Transfer Details (indicate cue type and reason): increased assist due to R lateral lean in standing Toileting- Clothing Manipulation and Hygiene: Moderate assistance;Sit to/from stand;Sitting/lateral lean       Functional mobility during ADLs: Moderate assistance;Cueing for sequencing;Cueing for safety;Rolling walker (2 wheels) General ADL Comments: Prior to this admission, 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 Patient reports full independence in ADLs and functional mobility. Patient presenting with cognitive impairment,  impulsivity, up to  mod A for functional mobility with RW and ADL management. Patient tangential throughout OT evaluation, and requiring cues to maintain attention to task. OT recommending intensive therapy > 3 hours prior to discharge home. OT will follow.     Vision Baseline Vision/History: 1 Wears glasses Ability to See in Adequate Light: 0 Adequate Patient Visual Report: No change from baseline Vision Assessment?: No apparent visual deficits     Perception Perception: Not tested       Praxis Praxis: Not tested       Pertinent Vitals/Pain Pain Assessment Pain Assessment: Faces Faces Pain Scale: Hurts little more Pain Location: L leg Pain Descriptors / Indicators: Discomfort, Grimacing, Guarding Pain Intervention(s): Limited activity within patient's tolerance, Monitored during session, Repositioned     Extremity/Trunk Assessment Upper Extremity Assessment Upper Extremity Assessment: Generalized weakness   Lower Extremity Assessment Lower Extremity Assessment: Defer to PT evaluation   Cervical / Trunk Assessment Cervical / Trunk Assessment: Normal   Communication Communication Communication: Impaired   Cognition Arousal: Alert Behavior During Therapy: Impulsive Cognition: Cognition impaired   Orientation impairments: Time Awareness: Intellectual awareness impaired, Online awareness impaired Memory impairment (select all impairments): Working memory Attention impairment (select first level of impairment): Selective attention Executive functioning impairment (select all impairments): Organization, Sequencing, Reasoning, Problem solving OT - Cognition Comments: Impulsive and signficant decreased safety awareness, tangential with all discussion                 Following commands: Impaired Following commands impaired: Follows one step commands inconsistently, Follows one step commands with increased time     Cueing  General Comments   Cueing Techniques:  Verbal cues;Gestural cues;Tactile cues;Visual cues      Exercises     Shoulder Instructions      Home Living Family/patient expects to be discharged to:: Private residence Living Arrangements: Spouse/significant other Available Help at Discharge: Family;Available 24 hours/day Type of Home: House Home Access: Level entry     Home Layout: One level     Bathroom Shower/Tub: Walk-in shower         Home Equipment: None   Additional Comments: Potential inconsistencies due to confusion      Prior Functioning/Environment Prior Level of Function : Independent/Modified Independent;Working/employed;Driving             Mobility Comments: ind works as Music therapist ADLs Comments: ind works as Music therapist, owns business.    OT Problem List: Decreased strength;Decreased range of motion;Decreased activity tolerance;Impaired balance (sitting and/or standing);Decreased coordination;Decreased cognition;Decreased safety awareness   OT Treatment/Interventions: Self-care/ADL training;Therapeutic exercise;Energy conservation;DME and/or AE instruction;Manual therapy;Cognitive remediation/compensation;Patient/family education;Balance training;Therapeutic activities      OT Goals(Current goals can be found in the care plan section)   Acute Rehab OT Goals Patient Stated Goal: to get better OT Goal Formulation: With patient Time For Goal Achievement: 06/19/23 Potential to Achieve Goals: Good   OT Frequency:  Min 1X/week    Co-evaluation              AM-PAC OT "6 Clicks" Daily Activity     Outcome Measure Help from another person eating meals?: A Little Help from another person taking care of personal grooming?: A Little Help from another person toileting, which includes using toliet, bedpan, or urinal?: A Lot Help from another person bathing (including washing, rinsing, drying)?: A Little Help from another person to put on and taking off regular upper body clothing?: A  Little Help from another person to put on and taking off regular lower body clothing?:  A Lot 6 Click Score: 16   End of Session Equipment Utilized During Treatment: Gait belt;Rolling walker (2 wheels) Nurse Communication: Mobility status  Activity Tolerance: Patient tolerated treatment well Patient left: in bed;with call bell/phone within reach;with bed alarm set  OT Visit Diagnosis: Unsteadiness on feet (R26.81);Other abnormalities of gait and mobility (R26.89);Muscle weakness (generalized) (M62.81);Other symptoms and signs involving cognitive function;Pain Pain - Right/Left: Left Pain - part of body: Ankle and joints of foot                Time: 1202-1220 OT Time Calculation (min): 18 min Charges:  OT General Charges $OT Visit: 1 Visit OT Evaluation $OT Eval Moderate Complexity: 1 Mod  Pollyann Glen E. Winslow Verrill, OTR/L Acute Rehabilitation Services [phone]   Cherlyn Cushing 06/05/2023, 1:29 PM

## 2023-06-05 NOTE — Plan of Care (Signed)
  Problem: Clinical Measurements: Goal: Diagnostic test results will improve Outcome: Progressing Goal: Respiratory complications will improve Outcome: Progressing Goal: Cardiovascular complication will be avoided Outcome: Progressing   Problem: Activity: Goal: Risk for activity intolerance will decrease Outcome: Progressing   Problem: Coping: Goal: Level of anxiety will decrease Outcome: Progressing   Problem: Pain Managment: Goal: General experience of comfort will improve and/or be controlled Outcome: Progressing   Problem: Safety: Goal: Ability to remain free from injury will improve Outcome: Progressing

## 2023-06-05 NOTE — Progress Notes (Signed)
Inpatient Rehab Coordinator Note:  I met with patient at bedside to discuss CIR recommendations and goals/expectations of CIR stay.  We reviewed 3 hrs/day of therapy, physician follow up, and average length of stay 2 weeks (dependent upon progress) with goals of supervision.  States spouse is home and can provide 24/7 supervision.  Confirms [employer] as coverage and I explained prior auth process.  He is in agreement to pursue and I will start request today.  Estill Dooms, PT, DPT Admissions Coordinator [phone] 06/05/23  1:57 PM

## 2023-06-06 DIAGNOSIS — R092 Respiratory arrest: Secondary | ICD-10-CM | POA: Diagnosis not present

## 2023-06-06 LAB — AMMONIA: Ammonia: 19 umol/L (ref 9–35)

## 2023-06-06 MED ORDER — LORAZEPAM 2 MG/ML IJ SOLN
INTRAMUSCULAR | Status: AC
  Filled 2023-06-06: qty 1

## 2023-06-06 MED ORDER — LORAZEPAM 2 MG/ML IJ SOLN
2.0000 mg | Freq: Once | INTRAMUSCULAR | Status: AC
  Administered 2023-06-06: 2 mg via INTRAVENOUS

## 2023-06-06 NOTE — Plan of Care (Signed)
   Problem: Education: Goal: Ability to describe self-care measures that may prevent or decrease complications (Diabetes Survival Skills Education) will improve Outcome: Progressing   Problem: Education: Goal: Individualized Educational Video(s) Outcome: Progressing   Problem: Coping: Goal: Ability to adjust to condition or change in health will improve Outcome: Progressing

## 2023-06-06 NOTE — Progress Notes (Addendum)
 Patient becoming more agitated and restless.  Mild hallucinations. Increasing confusion after previously being alert and oriented to person, place, time. Now just oriented to person and time.  Patient asking for alcohol.  Night meds given at 2146, and then haldol around 2300 with no improvement.  2330-CIWA score now, 12.  MD notified. Orders placed for one time dose 2mg  PO ativan and ammonia level.  0030-ammonia level 19-patient currently asleep.   0154-CIWA 16, patient more confused and more attempts at getting out of bed. MD notified. 2mg  IV ativan added.  0307-CIWA score 3.

## 2023-06-06 NOTE — Progress Notes (Signed)
 PROGRESS NOTE    Ralph [name]  HKV:[id number] DOB: 10-Apr-1951 DOA: 05/25/2023 PCP: Sigmund Hazel, MD   Brief Narrative: This 73 year old male with PMH significant for hyperlipidemia, GERD, asthma, alcohol use disorder who was brought by family , He was found unresponsive. He was in respiratory distress. He required BVM assistance by EMS.  Significant other notes that he had a syncopal episode one day prior, had some coughing . She states these episodes seemed to happen after he coughs, feels mildly short of breath "like his asthma is starting up" and then falls to the floor.  Patient had similar episode, he felt like he was going to die.  Patient drinks alcohol daily,  he denies any recent alcohol withdrawals. He reports asthma is under control with his daily maintenance medication.  Patient was intubated for airway protection and admitted in the ICU.  He required Precedex for agitation.  Successfully extubated now remains on phenobarb taper and Seroquel. TRH pickup 06/05/2023.  Assessment & Plan:   Principal Problem:   Respiratory arrest (HCC) Active Problems:   Malnutrition of moderate degree  Alcohol withdrawal: Hospital delirium: Patient presented with agitation and respiratory distress requiring intubation. Patient became acutely agitated on the night of 2/6, approximately 3 days after last drink. Intubated 2/6 for airway protection, extubated on 2/7. CIWA overnight was 4-10.  Complete phenobarb taper. Continue Seroquel 100 mg at bedtime. Successfully off Precedex . Continue thiamine, folic acid, Multivitamin. Continue CIWA . Delirium precautions, keep awake/active during day   Acute hypoxic respiratory failure requiring mechanical ventilation: Asthma without exacerbation: He was changed from Breo/incruse to triple neb therapy.  Now remains on room air. Repeat CXR with bibasilar atelectasis vs mild edema. Continue inspiratory spirometry and flutter. Hepatic function panel >  minimal elevated of LFTs -Ammonia normal   Syncope:  He was evaluated by Neurology.  Workup benign and they have signed off. Continue Supportive care   GERD: Continue pantoprazole.   Deconditioning: Working with PT, possible CIR evaluation   Bilateral Foot Erythema /  Pressure Injury Stage I Erythema and mild swelling of bilateral feet, wonder if injury in bed while agitated against bed rails. If fevers or leukocytosis and concern for infectious process, could initiate abx. Xray showed remote fractures bilaterally. No fever, No leukocytosis  Continue tylenol PRN and voltaren gel QID Prevalon boots.    DVT prophylaxis: Heparin Code Status: Full code Family Communication: No family at bedside Disposition Plan:   Status is: Inpatient Remains inpatient appropriate because: Severity of illness  Consultants:  PCCM  Procedures: Intubated > extubated.  Antimicrobials: Anti-infectives (From admission, onward)    Start     Dose/Rate Route Frequency Ordered Stop   05/28/23 1000  imipenem-cilastatin (PRIMAXIN) 500 mg in sodium chloride 0.9 % 100 mL IVPB  Status:  Discontinued        500 mg 200 mL/hr over 30 Minutes Intravenous Every 12 hours 05/28/23 0737 05/28/23 0738   05/25/23 1545  doxycycline (VIBRA-TABS) tablet 100 mg  Status:  Discontinued        100 mg Oral Every 12 hours 05/25/23 1446 05/26/23 1131      Subjective: Patient was seen and examined at bedside.  Overnight events noted.   Patient was agitated earlier in the morning, He was given Haldol.  Patient remains sedated when patient seen. Patient remains on CIWA protocol.  Objective: Vitals:   06/06/23 0753 06/06/23 0755 06/06/23 0821 06/06/23 1144  BP:   135/69 123/85  Pulse:   81 96  Resp:   19 19  Temp:    97.9 F (36.6 C)  TempSrc:    Oral  SpO2: 97% 97% 98% 96%  Weight:      Height:        Intake/Output Summary (Last 24 hours) at 06/06/2023 1250 Last data filed at 06/05/2023 2337 Gross per 24 hour   Intake 240 ml  Output 1250 ml  Net -1010 ml   Filed Weights   06/04/23 0500 06/05/23 1413 06/06/23 0307  Weight: 85.8 kg 84.3 kg 81.9 kg    Examination:  General exam: Appears sedated , deconditioned,  not in any acute distress. Respiratory system: CTA Bilaterally . Respiratory effort normal.  RR 14 Cardiovascular system: S1 & S2 heard, RRR. No JVD, murmurs, rubs, gallops or clicks.  Gastrointestinal system: Abdomen is non distended, soft and non tender. Normal bowel sounds heard. Central nervous system: Alert and oriented x 2. No focal neurological deficits. Extremities: No edema, no cyanosis, no clubbing. Skin: No rashes, lesions or ulcers Psychiatry: Judgement and insight appear normal. Mood & affect appropriate.     Data Reviewed: I have personally reviewed following labs and imaging studies  CBC: Recent Labs  Lab 05/31/23 0632 06/01/23 0254 06/03/23 0343 06/04/23 0211  WBC 8.7 8.9 9.9 7.4  HGB 13.5 13.0 15.3 14.3  HCT 37.3* 38.3* 43.3 40.5  MCV 103.6* 105.2* 102.9* 103.3*  PLT 186 181 193 250   Basic Metabolic Panel: Recent Labs  Lab 05/31/23 0632 06/01/23 0254 06/02/23 0632 06/02/23 0839 06/03/23 0343 06/04/23 0211 06/05/23 0317  NA 141 143  --  141 138 138 140  K 3.7 4.1  --  3.7 4.2 3.1* 4.3  CL 109 106  --  107 101 104 104  CO2 20* 23  --  22 20* 22 27  GLUCOSE 106* 91  --  93 96 120* 100*  BUN 20 18  --  18 15 14 13   CREATININE 0.87 0.92  --  1.11 0.92 1.02 0.94  CALCIUM 9.3 9.6  --  9.3 8.9 8.9 9.1  MG 1.9  --  2.0  --   --  1.8  --    GFR: Estimated Creatinine Clearance: 75.7 mL/min (by C-G formula based on SCr of 0.94 mg/dL). Liver Function Tests: Recent Labs  Lab 06/03/23 1041  AST 43*  ALT 56*  ALKPHOS 57  BILITOT 1.1  PROT 6.9  ALBUMIN 3.5   No results for input(s): "LIPASE", "AMYLASE" in the last 168 hours. Recent Labs  Lab 06/03/23 1041 06/05/23 2346  AMMONIA 20 19   Coagulation Profile: No results for input(s): "INR",  "PROTIME" in the last 168 hours. Cardiac Enzymes: No results for input(s): "CKTOTAL", "CKMB", "CKMBINDEX", "TROPONINI" in the last 168 hours. BNP (last 3 results) No results for input(s): "PROBNP" in the last 8760 hours. HbA1C: No results for input(s): "HGBA1C" in the last 72 hours. CBG: Recent Labs  Lab 06/05/23 0307 06/05/23 0710 06/05/23 1059 06/05/23 1524 06/05/23 1932  GLUCAP 89 117* 111* 105* 150*   Lipid Profile: No results for input(s): "CHOL", "HDL", "LDLCALC", "TRIG", "CHOLHDL", "LDLDIRECT" in the last 72 hours. Thyroid Function Tests: No results for input(s): "TSH", "T4TOTAL", "FREET4", "T3FREE", "THYROIDAB" in the last 72 hours. Anemia Panel: No results for input(s): "VITAMINB12", "FOLATE", "FERRITIN", "TIBC", "IRON", "RETICCTPCT" in the last 72 hours. Sepsis Labs: No results for input(s): "PROCALCITON", "LATICACIDVEN" in the last 168 hours.  No results found for this or any previous visit (from the past 240 hours).  Radiology Studies: No results found.  Scheduled Meds:  arformoterol  15 mcg Nebulization BID   budesonide (PULMICORT) nebulizer solution  0.5 mg Nebulization BID   Chlorhexidine Gluconate Cloth  6 each Topical Daily   diclofenac Sodium  4 g Topical QID   escitalopram  20 mg Oral Daily   feeding supplement  237 mL Oral BID BM   folic acid  1 mg Oral Daily   heparin  5,000 Units Subcutaneous Q8H   multivitamin with minerals  1 tablet Oral Daily   pantoprazole  40 mg Oral Daily   pneumococcal 20-valent conjugate vaccine  0.5 mL Intramuscular Tomorrow-1000   polyethylene glycol  17 g Oral BID   QUEtiapine  100 mg Oral QHS   revefenacin  175 mcg Nebulization Daily   senna-docusate  1 tablet Oral BID   sodium chloride flush  10-40 mL Intracatheter Q12H   thiamine  100 mg Oral Daily   Continuous Infusions:   LOS: 12 days    Time spent: 35 mins    Willeen Niece, MD [facility]   If 7PM-7AM, please contact night-coverage

## 2023-06-07 DIAGNOSIS — R092 Respiratory arrest: Secondary | ICD-10-CM | POA: Diagnosis not present

## 2023-06-07 NOTE — Progress Notes (Signed)
 PROGRESS NOTE    MERIT MAYBEE  BJY:[id number] DOB: 08/12/1950 DOA: 05/25/2023 PCP: Sigmund Hazel, MD   Brief Narrative: This 73 year old male with PMH significant for hyperlipidemia, GERD, asthma, alcohol use disorder who was brought by family , He was found unresponsive. He was in respiratory distress. He required BVM assistance by EMS.  Significant other notes that he had a syncopal episode one day prior, had some coughing . She states these episodes seemed to happen after he coughs, feels mildly short of breath "like his asthma is starting up" and then falls to the floor.  Patient had similar episode, he felt like he was going to die.  Patient drinks alcohol daily,  he denies any recent alcohol withdrawals. He reports asthma is under control with his daily maintenance medication.  Patient was intubated for airway protection and admitted in the ICU.  He required Precedex for agitation.  Successfully extubated now remains on phenobarb taper and Seroquel. TRH pickup 06/05/2023.  Assessment & Plan:   Principal Problem:   Respiratory arrest (HCC) Active Problems:   Malnutrition of moderate degree  Alcohol withdrawal: Hospital delirium: Patient presented with agitation and respiratory distress requiring intubation. Patient became acutely agitated on the night of 2/6, approximately 3 days after last drink. Intubated 2/6 for airway protection, extubated on 2/7. CIWA overnight was 4-10.  Complete phenobarb taper. Continue Seroquel 100 mg at bedtime. Successfully off Precedex . Continue thiamine, folic acid, Multivitamin. Continue CIWA . Delirium precautions, keep awake/active during day   Acute hypoxic respiratory failure requiring mechanical ventilation: Asthma without exacerbation: He was changed from [facility] / incruse to triple neb therapy.  Now remains on room air. Repeat CXR with bibasilar atelectasis vs mild edema. Continue inspiratory spirometry and flutter. Hepatic function panel  > minimal elevated of LFTs -Ammonia normal   Syncope:  He was evaluated by Neurology.  Workup benign and they have signed off. Continue Supportive care   GERD: Continue pantoprazole.   Deconditioning: Working with PT, possible CIR evaluation   Bilateral Foot Erythema /  Pressure Injury Stage I Erythema and mild swelling of bilateral feet, wonder if injury in bed while agitated against bed rails. If fevers or leukocytosis and concern for infectious process, could initiate abx. Xray showed remote fractures bilaterally. No fever, No leukocytosis  Continue tylenol PRN and voltaren gel QID Prevalon boots.    DVT prophylaxis: Heparin Code Status: Full code Family Communication: Wife at bed side. Disposition Plan:   Status is: Inpatient Remains inpatient appropriate because: Medically stable, awaiting CIR.  Consultants:  PCCM  Procedures: Intubated > extubated.  Antimicrobials: Anti-infectives (From admission, onward)    Start     Dose/Rate Route Frequency Ordered Stop   05/28/23 1000  imipenem-cilastatin (PRIMAXIN) 500 mg in sodium chloride 0.9 % 100 mL IVPB  Status:  Discontinued        500 mg 200 mL/hr over 30 Minutes Intravenous Every 12 hours 05/28/23 0737 05/28/23 0738   05/25/23 1545  doxycycline (VIBRA-TABS) tablet 100 mg  Status:  Discontinued        100 mg Oral Every 12 hours 05/25/23 1446 05/26/23 1131      Subjective: Patient was seen and examined at bedside.Overnight events noted.  Patient feels much improved, denies any agitation or restlessness. Patient remains on CIWA protocol.  Objective: Vitals:   06/07/23 0632 06/07/23 0743 06/07/23 0905 06/07/23 1159  BP:   129/75 (!) 142/94  Pulse:   84 84  Resp: 17  20 19   Temp:  97.9 F (36.6 C) 97.8 F (36.6 C)  TempSrc:   Oral Oral  SpO2:  96% 95% 92%  Weight: 81.6 kg     Height:        Intake/Output Summary (Last 24 hours) at 06/07/2023 1239 Last data filed at 06/07/2023 0905 Gross per 24 hour   Intake 600 ml  Output 850 ml  Net -250 ml   Filed Weights   06/05/23 1413 06/06/23 0307 06/07/23 8119  Weight: 84.3 kg 81.9 kg 81.6 kg    Examination:  General exam: Appears comfortable, deconditioned, not in any acute distress. Respiratory system: CTA Bilaterally . Respiratory effort normal.  RR 14 Cardiovascular system: S1 & S2 heard, RRR. No JVD, murmurs, rubs, gallops or clicks.  Gastrointestinal system: Abdomen is non distended, soft and non tender. Normal bowel sounds heard. Central nervous system: Alert and oriented x 2. No focal neurological deficits. Extremities: No edema, no cyanosis, no clubbing. Skin: No rashes, lesions or ulcers Psychiatry: Judgement and insight appear normal. Mood & affect appropriate.     Data Reviewed: I have personally reviewed following labs and imaging studies  CBC: Recent Labs  Lab 06/01/23 0254 06/03/23 0343 06/04/23 0211  WBC 8.9 9.9 7.4  HGB 13.0 15.3 14.3  HCT 38.3* 43.3 40.5  MCV 105.2* 102.9* 103.3*  PLT 181 193 250   Basic Metabolic Panel: Recent Labs  Lab 06/01/23 0254 06/02/23 0632 06/02/23 0839 06/03/23 0343 06/04/23 0211 06/05/23 0317  NA 143  --  141 138 138 140  K 4.1  --  3.7 4.2 3.1* 4.3  CL 106  --  107 101 104 104  CO2 23  --  22 20* 22 27  GLUCOSE 91  --  93 96 120* 100*  BUN 18  --  18 15 14 13   CREATININE 0.92  --  1.11 0.92 1.02 0.94  CALCIUM 9.6  --  9.3 8.9 8.9 9.1  MG  --  2.0  --   --  1.8  --    GFR: Estimated Creatinine Clearance: 75.7 mL/min (by C-G formula based on SCr of 0.94 mg/dL). Liver Function Tests: Recent Labs  Lab 06/03/23 1041  AST 43*  ALT 56*  ALKPHOS 57  BILITOT 1.1  PROT 6.9  ALBUMIN 3.5   No results for input(s): "LIPASE", "AMYLASE" in the last 168 hours. Recent Labs  Lab 06/03/23 1041 06/05/23 2346  AMMONIA 20 19   Coagulation Profile: No results for input(s): "INR", "PROTIME" in the last 168 hours. Cardiac Enzymes: No results for input(s): "CKTOTAL",  "CKMB", "CKMBINDEX", "TROPONINI" in the last 168 hours. BNP (last 3 results) No results for input(s): "PROBNP" in the last 8760 hours. HbA1C: No results for input(s): "HGBA1C" in the last 72 hours. CBG: Recent Labs  Lab 06/05/23 0307 06/05/23 0710 06/05/23 1059 06/05/23 1524 06/05/23 1932  GLUCAP 89 117* 111* 105* 150*   Lipid Profile: No results for input(s): "CHOL", "HDL", "LDLCALC", "TRIG", "CHOLHDL", "LDLDIRECT" in the last 72 hours. Thyroid Function Tests: No results for input(s): "TSH", "T4TOTAL", "FREET4", "T3FREE", "THYROIDAB" in the last 72 hours. Anemia Panel: No results for input(s): "VITAMINB12", "FOLATE", "FERRITIN", "TIBC", "IRON", "RETICCTPCT" in the last 72 hours. Sepsis Labs: No results for input(s): "PROCALCITON", "LATICACIDVEN" in the last 168 hours.  No results found for this or any previous visit (from the past 240 hours).   Radiology Studies: No results found.  Scheduled Meds:  arformoterol  15 mcg Nebulization BID   budesonide (PULMICORT) nebulizer solution  0.5 mg  Nebulization BID   Chlorhexidine Gluconate Cloth  6 each Topical Daily   diclofenac Sodium  4 g Topical QID   escitalopram  20 mg Oral Daily   feeding supplement  237 mL Oral BID BM   folic acid  1 mg Oral Daily   heparin  5,000 Units Subcutaneous Q8H   multivitamin with minerals  1 tablet Oral Daily   pantoprazole  40 mg Oral Daily   pneumococcal 20-valent conjugate vaccine  0.5 mL Intramuscular Tomorrow-1000   polyethylene glycol  17 g Oral BID   QUEtiapine  100 mg Oral QHS   revefenacin  175 mcg Nebulization Daily   senna-docusate  1 tablet Oral BID   sodium chloride flush  10-40 mL Intracatheter Q12H   thiamine  100 mg Oral Daily   Continuous Infusions:   LOS: 13 days    Time spent: 35 mins    Willeen Niece, MD [facility]   If 7PM-7AM, please contact night-coverage

## 2023-06-08 DIAGNOSIS — R092 Respiratory arrest: Secondary | ICD-10-CM | POA: Diagnosis not present

## 2023-06-08 LAB — COMPREHENSIVE METABOLIC PANEL
ALT: 57 U/L — ABNORMAL HIGH (ref 0–44)
AST: 37 U/L (ref 15–41)
Albumin: 3.2 g/dL — ABNORMAL LOW (ref 3.5–5.0)
Alkaline Phosphatase: 52 U/L (ref 38–126)
Anion gap: 9 (ref 5–15)
BUN: 15 mg/dL (ref 8–23)
CO2: 26 mmol/L (ref 22–32)
Calcium: 9.5 mg/dL (ref 8.9–10.3)
Chloride: 104 mmol/L (ref 98–111)
Creatinine, Ser: 1.07 mg/dL (ref 0.61–1.24)
GFR, Estimated: >60 mL/min (ref >60)
Glucose, Bld: 132 mg/dL — ABNORMAL HIGH (ref 70–99)
Potassium: 3.7 mmol/L (ref 3.5–5.1)
Sodium: 139 mmol/L (ref 135–145)
Total Bilirubin: 0.8 mg/dL (ref 0.0–1.2)
Total Protein: 6.6 g/dL (ref 6.5–8.1)

## 2023-06-08 LAB — CBC
HCT: 40.5 % (ref 39.0–52.0)
Hemoglobin: 13.9 g/dL (ref 13.0–17.0)
MCH: 35.5 pg — ABNORMAL HIGH (ref 26.0–34.0)
MCHC: 34.3 g/dL (ref 30.0–36.0)
MCV: 103.6 fL — ABNORMAL HIGH (ref 80.0–100.0)
Platelets: 360 10*3/uL (ref 150–400)
RBC: 3.91 MIL/uL — ABNORMAL LOW (ref 4.22–5.81)
RDW: 11.9 % (ref 11.5–15.5)
WBC: 9 10*3/uL (ref 4.0–10.5)
nRBC: 0 % (ref 0.0–0.2)

## 2023-06-08 LAB — PHOSPHORUS: Phosphorus: 3.2 mg/dL (ref 2.5–4.6)

## 2023-06-08 LAB — MAGNESIUM: Magnesium: 1.9 mg/dL (ref 1.7–2.4)

## 2023-06-08 MED ORDER — QUETIAPINE FUMARATE 100 MG PO TABS: 100.0000 mg | ORAL_TABLET | Freq: Every day | ORAL | 0 refills | Status: DC

## 2023-06-08 MED ORDER — LORAZEPAM 1 MG PO TABS
1.0000 mg | ORAL_TABLET | Freq: Once | ORAL | Status: AC
  Administered 2023-06-08: 1 mg via ORAL
  Filled 2023-06-08: qty 1

## 2023-06-08 MED ORDER — FOLIC ACID 1 MG PO TABS: 1.0000 mg | ORAL_TABLET | Freq: Every day | ORAL | 0 refills | Status: AC

## 2023-06-08 MED ORDER — VITAMIN B-1 100 MG PO TABS: 100.0000 mg | ORAL_TABLET | Freq: Every day | ORAL | 0 refills | Status: AC

## 2023-06-08 NOTE — Progress Notes (Addendum)
 Occupational Therapy Treatment Patient Details Name: Ralph [name] MRN: [id number] DOB: 09/23/1950 Today's Date: 06/08/2023   History of present illness 73 Y/O male with respiratory arrest admitted 2/3, developed etoh withdrawal, DTs. Intubated 2/6-2/7.  PMHx: hyperlipidemia, GERD, asthma, alcohol use.   OT comments  Pt progressing toward goals this session, completing ADLs with min A, supervision for bed mobility and min A for transfers with RW. Stands at sink x10 min for grooming tasks, no assist or cues needed for task sequencing. Pt did need min cues for hallway mobility to keep RW close, pt keeps RW to L side of his body. Pt with decr cognition, needs min cues to problem solve basic math and safety scenario situations. Pt presenting with impairments listed below, will follow acutely. Noted insurance denial for AIR, discussed with pt and spouse and pt/spouse feel comfortable with pt returning home at current level. Recommend HHOT at d/c.       If plan is discharge home, recommend the following:  A lot of help with walking and/or transfers;A lot of help with bathing/dressing/bathroom;Assistance with cooking/housework;Direct supervision/assist for financial management;Direct supervision/assist for medications management;Assist for transportation;Help with stairs or ramp for entrance;Supervision due to cognitive status   Equipment Recommendations  Other (comment) (RW (if they don't already have one))    Recommendations for Other Services      Precautions / Restrictions Precautions Precautions: Fall Restrictions Weight Bearing Restrictions Per Provider Order: No       Mobility Bed Mobility Overal bed mobility: Needs Assistance Bed Mobility: Sit to Supine       Sit to supine: Supervision        Transfers Overall transfer level: Needs assistance Equipment used: Rolling walker (2 wheels) Transfers: Sit to/from Stand Sit to Stand: Min assist                 Balance  Overall balance assessment: Needs assistance Sitting-balance support: Feet supported, Bilateral upper extremity supported Sitting balance-Leahy Scale: Good     Standing balance support: Reliant on assistive device for balance, During functional activity, Bilateral upper extremity supported Standing balance-Leahy Scale: Fair Standing balance comment: stands statically at sink for grooming tasks                           ADL either performed or assessed with clinical judgement   ADL Overall ADL's : Needs assistance/impaired     Grooming: Oral care;Standing;Contact guard assist Grooming Details (indicate cue type and reason): stands at sink x10 min for oral care and shaving task Upper Body Bathing: Minimal assistance   Lower Body Bathing: Minimal assistance   Upper Body Dressing : Minimal assistance   Lower Body Dressing: Minimal assistance   Toilet Transfer: Minimal assistance;Ambulation;Rolling walker (2 wheels);Regular Toilet   Toileting- Clothing Manipulation and Hygiene: Minimal assistance       Functional mobility during ADLs: Minimal assistance;Rolling walker (2 wheels)      Extremity/Trunk Assessment Upper Extremity Assessment Upper Extremity Assessment: Generalized weakness   Lower Extremity Assessment Lower Extremity Assessment: Defer to PT evaluation        Vision   Vision Assessment?: No apparent visual deficits   Perception Perception Perception: Not tested   Praxis Praxis Praxis: Not tested   Communication Communication Communication: No apparent difficulties   Cognition Arousal: Alert Behavior During Therapy: WFL for tasks assessed/performed               OT - Cognition Comments: some decr  safety awareness noted, pt keeping RW to L of him when ambulating in hallway, abandons RW when stepping around bed. Needs min cues to come to solution for simple money mgmt task, and safety awareness situations (i.e. what to do if theres a  fire, what to do if you are sick) etc; discussed having assist with med mgmt from spouse and pt verbalizes understanding.                   Following commands impaired: Follows one step commands inconsistently      Cueing      Exercises      Shoulder Instructions       General Comments VSS on RA    Pertinent Vitals/ Pain       Pain Assessment Pain Assessment: Faces Pain Score: 4  Faces Pain Scale: Hurts little more Pain Location: feet Pain Descriptors / Indicators: Discomfort, Grimacing, Guarding Pain Intervention(s): Limited activity within patient's tolerance, Monitored during session, Repositioned  Home Living                                          Prior Functioning/Environment              Frequency  Min 1X/week        Progress Toward Goals  OT Goals(current goals can now be found in the care plan section)  Progress towards OT goals: Progressing toward goals  Acute Rehab OT Goals Patient Stated Goal: none stated OT Goal Formulation: With patient Time For Goal Achievement: 06/19/23 Potential to Achieve Goals: Good ADL Goals Pt Will Perform Lower Body Bathing: with set-up;sit to/from stand;sitting/lateral leans Pt Will Perform Lower Body Dressing: with set-up;sit to/from stand;sitting/lateral leans Pt Will Transfer to Toilet: with set-up;ambulating;regular height toilet Pt Will Perform Toileting - Clothing Manipulation and hygiene: with set-up;sit to/from stand;sitting/lateral leans Additional ADL Goal #1: Patient will be able to complete upper level cognitive task without errors in order to return to prior level of independence. Additional ADL Goal #2: Patient will be able to complete functional task in standing with appropriate level of awareness and no need for supplemental cuing to promote safety and independence.  Plan      Co-evaluation                 AM-PAC OT "6 Clicks" Daily Activity     Outcome Measure    Help from another person eating meals?: None Help from another person taking care of personal grooming?: A Little Help from another person toileting, which includes using toliet, bedpan, or urinal?: A Little Help from another person bathing (including washing, rinsing, drying)?: A Little Help from another person to put on and taking off regular upper body clothing?: A Little Help from another person to put on and taking off regular lower body clothing?: A Little 6 Click Score: 19    End of Session Equipment Utilized During Treatment: Gait belt;Rolling walker (2 wheels)  OT Visit Diagnosis: Unsteadiness on feet (R26.81);Other abnormalities of gait and mobility (R26.89);Muscle weakness (generalized) (M62.81);Other symptoms and signs involving cognitive function;Pain Pain - Right/Left: Left Pain - part of body: Ankle and joints of foot   Activity Tolerance Patient tolerated treatment well   Patient Left in bed;with call bell/phone within reach;with bed alarm set   Nurse Communication Mobility status        Time: 4098-1191 OT Time Calculation (min):  35 min  Charges: OT General Charges $OT Visit: 1 Visit OT Treatments $Self Care/Home Management : 8-22 mins $Therapeutic Activity: 8-22 mins  Carver Fila, OTD, OTR/L SecureChat Preferred Acute Rehab [phone]   Dalphine Handing 06/08/2023, 4:27 PM

## 2023-06-08 NOTE — Discharge Instructions (Signed)
 Advised to follow-up with primary care physician in 1 week. Advised to take Seroquel 100 mg daily. Advised to refrain from drinking alcohol.

## 2023-06-08 NOTE — Progress Notes (Signed)
 Inpatient Rehab Admissions Coordinator:   Received a denial from [facility] for CIR stay.  TOC will need to pursue an alternate level of care.   Estill Dooms, PT, DPT Admissions Coordinator [phone] 06/08/23  2:45 PM

## 2023-06-08 NOTE — Plan of Care (Signed)
  Problem: Safety: Goal: Non-violent Restraint(s) Outcome: Progressing   Problem: Safety: Goal: Non-violent Restraint(s) Outcome: Progressing   Problem: Safety: Goal: Non-violent Restraint(s) Outcome: Progressing   Problem: Education: Goal: Ability to describe self-care measures that may prevent or decrease complications (Diabetes Survival Skills Education) will improve Outcome: Progressing Goal: Individualized Educational Video(s) Outcome: Progressing   Problem: Coping: Goal: Ability to adjust to condition or change in health will improve Outcome: Progressing   Problem: Fluid Volume: Goal: Ability to maintain a balanced intake and output will improve Outcome: Progressing   Problem: Health Behavior/Discharge Planning: Goal: Ability to identify and utilize available resources and services will improve Outcome: Progressing Goal: Ability to manage health-related needs will improve Outcome: Progressing   Problem: Metabolic: Goal: Ability to maintain appropriate glucose levels will improve Outcome: Progressing   Problem: Nutritional: Goal: Maintenance of adequate nutrition will improve Outcome: Progressing Goal: Progress toward achieving an optimal weight will improve Outcome: Progressing   Problem: Skin Integrity: Goal: Risk for impaired skin integrity will decrease Outcome: Progressing   Problem: Tissue Perfusion: Goal: Adequacy of tissue perfusion will improve Outcome: Progressing   Problem: Education: Goal: Knowledge of General Education information will improve Description: Including pain rating scale, medication(s)/side effects and non-pharmacologic comfort measures Outcome: Progressing   Problem: Health Behavior/Discharge Planning: Goal: Ability to manage health-related needs will improve Outcome: Progressing   Problem: Clinical Measurements: Goal: Ability to maintain clinical measurements within normal limits will improve Outcome: Progressing Goal:  Will remain free from infection Outcome: Progressing Goal: Diagnostic test results will improve Outcome: Progressing Goal: Respiratory complications will improve Outcome: Progressing Goal: Cardiovascular complication will be avoided Outcome: Progressing   Problem: Activity: Goal: Risk for activity intolerance will decrease Outcome: Progressing   Problem: Nutrition: Goal: Adequate nutrition will be maintained Outcome: Progressing   Problem: Coping: Goal: Level of anxiety will decrease Outcome: Progressing   Problem: Elimination: Goal: Will not experience complications related to bowel motility Outcome: Progressing Goal: Will not experience complications related to urinary retention Outcome: Progressing   Problem: Pain Managment: Goal: General experience of comfort will improve and/or be controlled Outcome: Progressing   Problem: Safety: Goal: Ability to remain free from injury will improve Outcome: Progressing   Problem: Skin Integrity: Goal: Risk for impaired skin integrity will decrease Outcome: Progressing   Problem: Activity: Goal: Ability to tolerate increased activity will improve Outcome: Progressing   Problem: Respiratory: Goal: Ability to maintain a clear airway and adequate ventilation will improve Outcome: Progressing   Problem: Role Relationship: Goal: Method of communication will improve Outcome: Progressing

## 2023-06-08 NOTE — Care Management Important Message (Signed)
 Important Message  Patient Details  Name: Ralph [name] MRN: [id number] Date of Birth: [birth year]   Important Message Given:  Yes - Medicare IM     Dorena Bodo 06/08/2023, 3:12 PM

## 2023-06-08 NOTE — Progress Notes (Signed)
 Inpatient Rehab Admissions Coordinator:   Awaiting insurance determination for CIR prior auth request.   Estill Dooms, PT, DPT Admissions Coordinator [phone] 06/08/23  11:15 AM

## 2023-06-08 NOTE — TOC Progression Note (Addendum)
 Transition of Care [facility]) - Progression Note    Patient Details  Name: Ralph [name] MRN: [id number] Date of Birth: 1950/11/25  Transition of Care [facility]) CM/SW Contact  Marliss Coots, LCSW Phone Number: 06/08/2023, 10:27 AM  Clinical Narrative:     10:27 AM CSW introduced herself and role to patient at bedside. CSW informed patient of TOC consult (substance use resources/counseling) and offered resources. Patient declined offer and informed CSW that he quit drinking 30 days and does not use other substances. Per CIR, insurance authorization request remains pending for CIR admission.  2:42 PM CIR informed medical team that insurance authorization for CIR was declined due to lack of medical necessity.   Expected Discharge Plan: IP Rehab Facility Barriers to Discharge: Continued Medical Work up  Expected Discharge Plan and Services In-house Referral: Clinical Social Work Discharge Planning Services: CM Consult   Living arrangements for the past 2 months: Single Family Home                                       Social Determinants of Health (SDOH) Interventions SDOH Screenings   Food Insecurity: Patient Unable To Answer (05/25/2023)  Housing: Patient Unable To Answer (05/25/2023)  Transportation Needs: Patient Unable To Answer (05/25/2023)  Utilities: Patient Unable To Answer (05/25/2023)  Social Connections: Patient Unable To Answer (05/25/2023)  Tobacco Use: Medium Risk (06/05/2023)    Readmission Risk Interventions     No data to display

## 2023-06-08 NOTE — H&P (Incomplete)
 Physical Medicine and Rehabilitation Admission H&P    Chief Complaint  Patient presents with   Respiratory Arrest  : HPI: Ralph [name] is a 73 year old right-handed male history significant for hyperlipidemia, GERD, alcohol use disorder, asthma, quit smoking 10 years ago.  Per chart review patient lives with spouse.  Works as a Surveyor, minerals.  1 level home with level entry.  Presented 05/25/2023 with reported respiratory arrest and unresponsiveness.  Per ED provider significant other found him on the floor unresponsive about 30 minutes prior to arrival to the emergency department after suspected fall.  Spouse had reported possible syncopal episode the day prior as well as noted cough and reports of shortness of breath..  Patient did require intubation for airway protection.  Chest x-ray showed no focal airspace consolidation no pleural effusion or pneumothorax.  CT cervical spine negative.  CTA showed no evidence of acute intracranial abnormality.  No emergent large vessel occlusion.  Admission chemistries unremarkable except creatinine 1.46, AST of 50, ammonia level within normal limits.  Alcohol 169, urine drug screen negative.  Hospital course patient was extubated 05/29/2023.  Placed on CIWA precautions and successfully weaned off Precedex.  Completed course of phenobarbital taper.  Close monitoring for any further delirium and remains on nightly Seroquel.  He did receive followed by neurology services after reported syncopal episode MRI showed no evidence of acute or subacute infarct and neurology has since signed off.  Wound care nurse follow-up for bilateral foot erythema pressure injury stage I with Prevalon boots in place.  X-ray showed remote fractures bilaterally.  He was cleared to begin subcutaneous heparin for DVT prophylaxis.  Acute hypoxic respiratory failure improved and remains on inhalers as directed and has been weaned to room air.  Therapy evaluations completed due to patient  decreased functional mobility was admitted for a comprehensive rehab program.  Review of Systems  Constitutional:  Negative for chills and fever.  HENT:  Negative for hearing loss.   Eyes:  Negative for blurred vision and double vision.  Respiratory:  Positive for cough. Negative for shortness of breath and wheezing.   Cardiovascular:  Negative for chest pain, palpitations and leg swelling.  Gastrointestinal:  Positive for constipation. Negative for heartburn, nausea and vomiting.       GERD  Genitourinary:  Negative for dysuria, flank pain and hematuria.  Musculoskeletal:  Positive for myalgias.  Skin:  Negative for rash.  All other systems reviewed and are negative.  Past Medical History:  Diagnosis Date   Arthritis    hands   Asthma    GERD (gastroesophageal reflux disease)    History of kidney stones    x2  -episodes   Hyperlipidemia    Renal disorder    Varicose veins    bilateral surgeries- no problems now   Past Surgical History:  Procedure Laterality Date   COLONOSCOPY WITH PROPOFOL N/A 03/20/2015   Procedure: COLONOSCOPY WITH PROPOFOL;  Surgeon: Charolett Bumpers, MD;  Location: [facility];  Service: Endoscopy;  Laterality: N/A;   HERNIA REPAIR     inguinal hernia   TONSILLECTOMY     VARICOSE VEIN SURGERY Bilateral    2 years ago   VASECTOMY     Family History  Problem Relation Age of Onset   Dementia Mother    COPD Father    Social History:  reports that he quit smoking about 10 years ago. His smoking use included cigarettes. He started smoking about 30 years ago. He has a 10  pack-year smoking history. He has never used smokeless tobacco. He reports current alcohol use. He reports that he does not use drugs. Allergies:  Allergies  Allergen Reactions   Latex Hives and Rash   Morphine And Codeine Hives   Medications Prior to Admission  Medication Sig Dispense Refill   ALPRAZolam (XANAX) 0.5 MG tablet Take 0.5 mg by mouth daily as needed for anxiety.      traMADol (ULTRAM) 50 MG tablet Take 50 mg by mouth daily as needed for moderate pain (pain score 4-6).     Budeson-Glycopyrrol-Formoterol (BREZTRI AEROSPHERE) 160-9-4.8 MCG/ACT AERO Inhale 1 puff into the lungs daily.     escitalopram (LEXAPRO) 20 MG tablet Take 20 mg by mouth daily.     omeprazole (PRILOSEC) 20 MG capsule Take 20 mg by mouth daily.     rosuvastatin (CRESTOR) 40 MG tablet Take 40 mg by mouth daily. (Patient not taking: Reported on 05/25/2023)     tamsulosin (FLOMAX) 0.4 MG CAPS capsule Take 0.4 mg by mouth at bedtime.        Home: Home Living Family/patient expects to be discharged to:: Private residence Living Arrangements: Spouse/significant other Available Help at Discharge: Family, Available 24 hours/day Type of Home: House Home Access: Level entry Home Layout: One level Bathroom Shower/Tub: Walk-in shower Home Equipment: None Additional Comments: Potential inconsistencies due to confusion   Functional History: Prior Function Prior Level of Function : Independent/Modified Independent, Working/employed, Driving Mobility Comments: ind works as Music therapist ADLs Comments: ind works as Music therapist, owns business.  Functional Status:  Mobility: Bed Mobility Overal bed mobility: Needs Assistance Bed Mobility: Supine to Sit Supine to sit: Contact guard Sit to supine: Mod assist, +2 for physical assistance, +2 for safety/equipment General bed mobility comments: HOB elevated, pt able to bring self to EOB without physical assist, no use of bed rails Transfers Overall transfer level: Needs assistance Equipment used: Rolling walker (2 wheels) Transfers: Sit to/from Stand Sit to Stand: Min assist General transfer comment: repeated verbal cues to push up from bed not pull up on walker, minA to power up and stedy during transition of hands from bed to RW Ambulation/Gait Ambulation/Gait assistance: Min assist Gait Distance (Feet): 200 Feet Assistive device: Rolling walker  (2 wheels) Gait Pattern/deviations: Wide base of support, Step-through pattern General Gait Details: pt with increased bilat UE dependence for balance, verbal cues for walker safety during turning. pt able to clear bilat feet and denies pain in either foot this date Gait velocity: dec Gait velocity interpretation: <1.31 ft/sec, indicative of household ambulator    ADL: ADL Overall ADL's : Needs assistance/impaired Eating/Feeding: Set up, Sitting Grooming: Set up, Sitting Upper Body Bathing: Minimal assistance, Sitting Lower Body Bathing: Moderate assistance, Sitting/lateral leans, Sit to/from stand, Maximal assistance Upper Body Dressing : Minimal assistance, Sitting Lower Body Dressing: Moderate assistance, Maximal assistance, Sitting/lateral leans, Sit to/from stand Toilet Transfer: Moderate assistance, Ambulation, Rolling walker (2 wheels) Toilet Transfer Details (indicate cue type and reason): increased assist due to R lateral lean in standing Toileting- Clothing Manipulation and Hygiene: Moderate assistance, Sit to/from stand, Sitting/lateral lean Functional mobility during ADLs: Moderate assistance, Cueing for sequencing, Cueing for safety, Rolling walker (2 wheels) General ADL Comments: Prior to this admission, 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 Patient reports full independence in ADLs and functional mobility. Patient presenting with cognitive impairment, impulsivity, up to mod A for functional mobility with RW and ADL management. Patient tangential throughout OT evaluation, and requiring cues to maintain  attention to task. OT recommending intensive therapy > 3 hours prior to discharge home. OT will follow.  Cognition: Cognition Overall Cognitive Status: Impaired/Different from baseline Orientation Level: Oriented X4 Cognition Arousal: Alert Behavior During Therapy: WFL for tasks assessed/performed Overall Cognitive Status: Impaired/Different from  baseline Area of Impairment: Orientation, Following commands, Safety/judgement, Problem solving Orientation Level: Disoriented to, Place, Time, Situation (Oriented to month, gave incorrect year several times after reorienting.) Following Commands: Follows one step commands inconsistently, Follows one step commands with increased time Safety/Judgement: Decreased awareness of safety, Decreased awareness of deficits Problem Solving: Slow processing, Decreased initiation, Difficulty sequencing, Requires verbal cues, Requires tactile cues General Comments: Restless, trying to climb out of bed as PT entered room. Mitts and posey belt in place.  Physical Exam: Blood pressure (!) 97/45, pulse 82, temperature 97.8 F (36.6 C), temperature source Oral, resp. rate 18, height 5\' 11"  (1.803 m), weight 81.7 kg, SpO2 97%. Physical Exam Neurological:     Comments: Patient is a bit restless.  Makes eye contact with examiner.  Follows simple commands.  He does answer simple questions but had difficulty recalling hospital course.     Results for orders placed or performed during the hospital encounter of 05/25/23 (from the past 48 hours)  Comprehensive metabolic panel     Status: Abnormal   Collection Time: 06/08/23  3:15 AM  Result Value Ref Range   Sodium 139 135 - 145 mmol/L   Potassium 3.7 3.5 - 5.1 mmol/L   Chloride 104 98 - 111 mmol/L   CO2 26 22 - 32 mmol/L   Glucose, Bld 132 (H) 70 - 99 mg/dL    Comment: Glucose reference range applies only to samples taken after fasting for at least 8 hours.   BUN 15 8 - 23 mg/dL   Creatinine, Ser 1.61 0.61 - 1.24 mg/dL   Calcium 9.5 8.9 - 09.6 mg/dL   Total Protein 6.6 6.5 - 8.1 g/dL   Albumin 3.2 (L) 3.5 - 5.0 g/dL   AST 37 15 - 41 U/L   ALT 57 (H) 0 - 44 U/L   Alkaline Phosphatase 52 38 - 126 U/L   Total Bilirubin 0.8 0.0 - 1.2 mg/dL   GFR, Estimated >04 >54 mL/min    Comment: (NOTE) Calculated using the CKD-EPI Creatinine Equation (2021)    Anion  gap 9 5 - 15    Comment: Performed at [facility], [address] [place], [place] [address]  Phosphorus     Status: None   Collection Time: 06/08/23  3:15 AM  Result Value Ref Range   Phosphorus 3.2 2.5 - 4.6 mg/dL    Comment: Performed at [facility], [address], [place], [place] [address]  Magnesium     Status: None   Collection Time: 06/08/23  3:15 AM  Result Value Ref Range   Magnesium 1.9 1.7 - 2.4 mg/dL    Comment: Performed at [facility], [address] [place], [place] [address]  CBC     Status: Abnormal   Collection Time: 06/08/23  3:15 AM  Result Value Ref Range   WBC 9.0 4.0 - 10.5 K/uL   RBC 3.91 (L) 4.22 - 5.81 MIL/uL   Hemoglobin 13.9 13.0 - 17.0 g/dL   HCT 13.0 86.5 - 78.4 %   MCV 103.6 (H) 80.0 - 100.0 fL   MCH 35.5 (H) 26.0 - 34.0 pg   MCHC 34.3 30.0 - 36.0 g/dL   RDW 69.6 29.5 -  15.5 %   Platelets 360 150 - 400 K/uL   nRBC 0.0 0.0 - 0.2 %    Comment: Performed at [facility], [address] [place], [place] [address]   No results found.    Blood pressure (!) 97/45, pulse 82, temperature 97.8 F (36.6 C), temperature source Oral, resp. rate 18, height 5\' 11"  (1.803 m), weight 81.7 kg, SpO2 97%.  Medical Problem List and Plan: 1. Functional deficits secondary to acute hypoxic respiratory failure requiring mechanical ventilation.  Patient extubated 2/7.  Hospital course complicated by encephalopathy with alcohol withdrawal  -patient may *** shower  -ELOS/Goals: *** 2.  Antithrombotics: -DVT/anticoagulation:  Pharmaceutical: Heparin  -antiplatelet therapy: N/A 3. Pain Management: Voltaren gel 4 times daily 4. Mood/Behavior/Sleep: Lexapro 20 mg daily.  -antipsychotic agents: Seroquel 100 mg nightly 5. Neuropsych/cognition: This patient is not capable of making decisions on his own behalf. 6. Skin/Wound Care/bilateral foot erythema: Follow-up wound care nurse.  Bilateral Prevalon boots 7. Fluids/Electrolytes/Nutrition:  Routine skin checks 8.  AKI/CKD.  Follow-up chemistries 9.  History of alcohol use.  CIWA protocol completed.  Provide counseling 10.  GERD.  Protonix 11.  Asthma/remote tobacco use.  Continue inhalers as directed patient has been weaned to room air 12.  Constipation.  MiraLAX twice daily.  Senokot 1 tablet twice daily.  ***  Mcarthur Rossetti Rhyse Skowron, PA-C 06/08/2023

## 2023-06-08 NOTE — Discharge Summary (Signed)
 Physician Discharge Summary  KATE SWEETMAN VWU:[id number] DOB: 03/28/1951 DOA: 05/25/2023  PCP: Sigmund Hazel, MD  Admit date: 05/25/2023  Discharge date: 06/09/2023  Admitted From: Home.  Disposition:  Home Health Services.  Recommendations for Outpatient Follow-up:  Follow up with PCP in 1-2 weeks. Please obtain BMP/CBC in one week. Advised to take Seroquel 100 mg daily. Advised to refrain from drinking alcohol. Advised to take thiamine and folic acid regularly.  Home Health: HHPT/OT Equipment/Devices: None  Discharge Condition: Stable CODE STATUS:Full code Diet recommendation: Heart Healthy   Brief [facility] Course: This 73 year old male with PMH significant for hyperlipidemia, GERD, asthma, alcohol use disorder who was brought by family , He was found unresponsive. He was in respiratory distress. He required BVM assistance by EMS.  Significant other notes that he had a syncopal episode one day prior, had some coughing . She states these episodes seemed to happen after he coughs, feels mildly short of breath "like his asthma is starting up" and then falls to the floor.  Patient had similar episode, he felt like he was going to die.  Patient drinks alcohol daily,  he denies any recent alcohol withdrawals. He reports asthma is under control with his daily maintenance medication.  Patient was intubated for airway protection and admitted in the ICU.  He required Precedex for agitation.  Successfully extubated now. remains on phenobarb taper and Seroquel. TRH pickup 06/05/2023.  Patient was continued on supportive care,  Seroquel daily,  IV hydration , thiamine and folic acid.  Patient has made significant improvement.  PT and OT recommended CIR which was declined by [employer].  Patient was evaluated by PT and OT,  recommended home health services.  Patient feels better and wants to be discharged,  Home health services arranged.  Discharge Diagnoses:  Principal Problem:    Respiratory arrest (HCC) Active Problems:   Malnutrition of moderate degree  Alcohol withdrawal: Hospital delirium: Patient presented with agitation and respiratory distress requiring intubation. Patient became acutely agitated on the night of 2/6, approximately 3 days after last drink. Intubated 2/6 for airway protection, extubated on 2/7. CIWA overnight was 4-10.  Completed phenobarb taper. Continue Seroquel 100 mg at bedtime. Successfully off Precedex . Continue thiamine, folic acid, Multivitamin. Continue CIWA . Delirium precautions, keep awake/active during day. Patient back to baseline.   Acute hypoxic respiratory failure requiring mechanical ventilation: Asthma without exacerbation: He was changed from Breo / incruse to triple neb therapy.  Now remains on room air. Repeat CXR with bibasilar atelectasis vs mild edema. Continue inspiratory spirometry and flutter. Hepatic function panel > minimal elevated of LFTs -Ammonia normal   Syncope:  He was evaluated by Neurology.  Workup benign and they have signed off. Continue Supportive care   GERD: Continue pantoprazole.   Deconditioning: Working with PT, possible CIR evaluation. PT > HHS arranged.   Bilateral Foot Erythema /  Pressure Injury Stage I Erythema and mild swelling of bilateral feet, wonder if injury in bed while agitated against bed rails.  If fevers or leukocytosis and concern for infectious process, could initiate abx. Xray showed remote fractures bilaterally. No fever, No leukocytosis > No need for antibiotics. Continue tylenol PRN and voltaren gel QID Prevalon boots.  Discharge Instructions  Discharge Instructions     Call MD for:  difficulty breathing, headache or visual disturbances   Complete by: As directed    Call MD for:  persistant dizziness or light-headedness   Complete by: As directed    Call  MD for:  persistant nausea and vomiting   Complete by: As directed    Diet - low sodium heart  healthy   Complete by: As directed    Diet Carb Modified   Complete by: As directed    Discharge instructions   Complete by: As directed    Advised to take Seroquel 100 mg daily. Advised to refrain from drinking alcohol.   Increase activity slowly   Complete by: As directed    No wound care   Complete by: As directed    No wound care   Complete by: As directed       Allergies as of 06/09/2023       Reactions   Latex Hives, Rash   Morphine And Codeine Hives        Medication List     STOP taking these medications    rosuvastatin 40 MG tablet Commonly known as: CRESTOR       TAKE these medications    ALPRAZolam 0.5 MG tablet Commonly known as: XANAX Take 0.5 mg by mouth daily as needed for anxiety.   Breztri Aerosphere 160-9-4.8 MCG/ACT Aero Generic drug: Budeson-Glycopyrrol-Formoterol Inhale 1 puff into the lungs daily.   escitalopram 20 MG tablet Commonly known as: LEXAPRO Take 20 mg by mouth daily.   folic acid 1 MG tablet Commonly known as: FOLVITE Take 1 tablet (1 mg total) by mouth daily.   omeprazole 20 MG capsule Commonly known as: PRILOSEC Take 20 mg by mouth daily.   QUEtiapine 100 MG tablet Commonly known as: SEROQUEL Take 1 tablet (100 mg total) by mouth at bedtime.   tamsulosin 0.4 MG Caps capsule Commonly known as: FLOMAX Take 0.4 mg by mouth at bedtime.   thiamine 100 MG tablet Commonly known as: Vitamin B-1 Take 1 tablet (100 mg total) by mouth daily.   traMADol 50 MG tablet Commonly known as: ULTRAM Take 50 mg by mouth daily as needed for moderate pain (pain score 4-6).               Durable Medical Equipment  (From admission, onward)           Start     Ordered   06/09/23 1020  For home use only DME 4 wheeled rolling walker with seat  Once       Question:  Patient needs a walker to treat with the following condition  Answer:  Generalized weakness   06/09/23 1019            Follow-up Information      Sigmund Hazel, MD Follow up in 1 week(s).   Specialty: Family Medicine Contact information: [address] [place] [address] [phone]         [facility] Follow up.   Specialty: Home Health Services Why: Someone will call you to schedule first home visit. Contact information: [address] [place] [address] [phone]                Allergies  Allergen Reactions   Latex Hives and Rash   Morphine And Codeine Hives    Consultations: PCCM   Procedures/Studies: [place] EKG SITE RITE Result Date: 06/03/2023 If Site Rite image not attached, placement could not be confirmed due to current cardiac rhythm.  DG Foot Complete Right Result Date: 06/03/2023 CLINICAL DATA:  Bilateral foot pain. EXAM: RIGHT FOOT COMPLETE - 3+ VIEW COMPARISON:  None Available. FINDINGS: Moderate plantar and mild posterior calcaneal heel spurs.  Mild dorsal talonavicular degenerative spurring. There is a well corticated ossicle measuring up to 6 mm within the expected medial base of the middle phalanx of the third toe, with the rest of the middle phalanx shifted approximately 6 mm laterally, favored to represent a remote/chronic oblique intra-articular fracture of the proximal medial aspect of the middle phalanx. There is an additional 4 mm well corticated ossicle just lateral to the distal aspect of the proximal phalanx of the fourth toe. There is mild irregularity and cortical thickening suggesting a remote healed fracture of the mid to distal shaft of the proximal phalanx of the second toe. Minimal degenerative spurring at the peripheral aspect of the great toe metatarsophalangeal joint. No definite acute fracture is seen.  No dislocation. IMPRESSION: 1. Moderate plantar and mild posterior calcaneal heel spurs. 2. Remote/chronic oblique intra-articular fracture of the proximal medial aspect of the middle phalanx of the third toe. This fracture appears nonfused, and  there is mild lateral subluxation/shifting of the dominant bone of the middle phalanx with respect of the proximal phalanx of the third toe. 3. Remote healed fracture of the mid to distal shaft of the proximal phalanx of the second toe. Electronically Signed   By: Neita Garnet M.D.   On: 06/03/2023 13:52   DG Foot Complete Left Result Date: 06/03/2023 CLINICAL DATA:  Bilateral foot pain. EXAM: LEFT FOOT - COMPLETE 3+ VIEW COMPARISON:  Left foot radiographs 10/31/2020 FINDINGS: There is diffuse decreased bone mineralization. Interval healing of the previously seen likely subacute fracture of the distal shaft of the proximal phalanx of the fourth toe on 10/31/2020 comparison radiographs. Interval healing of the previously seen nondisplaced fracture at the dorsal base of the distal phalanx of the great toe. Minimal degenerative spurring at the great toe metatarsophalangeal joint without significant joint space narrowing. Moderate to large plantar and mild posterior calcaneal heel spurs, unchanged. No acute fracture or dislocation. IMPRESSION: 1. Interval healing of the previously seen fracture of the distal shaft of the proximal phalanx of the fourth toe on 10/31/2020 comparison radiographs. 2. Interval healing of the previously seen nondisplaced fracture at the dorsal base of the distal phalanx of the great toe. 3. Moderate-to-large plantar calcaneal heel spur. 4. No acute fracture. Electronically Signed   By: Neita Garnet M.D.   On: 06/03/2023 13:46   DG CHEST PORT 1 VIEW Result Date: 06/03/2023 CLINICAL DATA:  [id number] AMS (altered mental status) [id number] EXAM: PORTABLE CHEST 1 VIEW COMPARISON:  05/28/2023 FINDINGS: Endotracheal and enteric tubes have been removed. Heart size within normal limits. Slightly low lung volumes. Mildly increased bibasilar interstitial markings. No pleural effusion or pneumothorax. IMPRESSION: Mildly increased bibasilar interstitial markings, which may reflect atelectasis versus  mild edema. Electronically Signed   By: Duanne Guess D.O.   On: 06/03/2023 12:06   DG Abd 1 View Result Date: 05/31/2023 CLINICAL DATA:  NG placement. EXAM: ABDOMEN - 1 VIEW COMPARISON:  Radiograph dated 05/28/2023. FINDINGS: Enteric tube with tip and side-port in the epigastric area in the region of the proximal stomach. IMPRESSION: Enteric tube with tip and side-port in the proximal stomach. Electronically Signed   By: Elgie Collard M.D.   On: 05/31/2023 14:09   DG Chest Port 1 View Result Date: 05/28/2023 CLINICAL DATA:  Enteric tube placement. EXAM: PORTABLE CHEST 1 VIEW COMPARISON:  Chest radiograph dated 05/25/2023. FINDINGS: Evaluation is limited due to patient's rotation and positioning. Endotracheal tube with tip approximately 4.5 cm above the carina. Enteric tube extends below the diaphragm  with tip beyond the inferior margin of the image. Left lung base density may represent atelectasis. Pneumonia is not excluded the right lung is clear. No pneumothorax. No acute osseous pathology. IMPRESSION: 1. Endotracheal tube with tip approximately 4.5 cm above the carina. 2. Enteric tube extends below the diaphragm with tip beyond the inferior margin of the image. 3. Left lung base atelectasis. Electronically Signed   By: Elgie Collard M.D.   On: 05/28/2023 11:09   DG Abd Portable 1V Result Date: 05/28/2023 CLINICAL DATA:  73 year old male enteric tube placement. EXAM: PORTABLE ABDOMEN - 1 VIEW COMPARISON:  05/25/2023 and earlier. FINDINGS: Portable AP upright view at 1033 hours. Enteric tube terminates in the left upper quadrant, side hole visible at the level of the proximal gastric body. Visible bowel-gas pattern within normal limits. Ongoing elevation of the left hemidiaphragm, left lung base hypo ventilation. Right lung base appears negative. No acute osseous abnormality identified. IMPRESSION: Satisfactory enteric tube placement in the stomach. Electronically Signed   By: Odessa Fleming M.D.   On:  05/28/2023 11:07   MR BRAIN WO CONTRAST Result Date: 05/27/2023 CLINICAL DATA:  Stroke suspected EXAM: MRI HEAD WITHOUT CONTRAST TECHNIQUE: Diffusion-weighted sequences of the brain and surrounding structures were obtained without intravenous contrast. COMPARISON:  02/03/2021 MRI head, correlation is also made with 05/25/2023 CT head FINDINGS: Evaluation is limited as the patient could not cooperate with the exam and only diffusion-weighted sequences were obtained. No restricted diffusion to suggest acute or subacute infarct. Advanced cerebral volume loss for age. IMPRESSION: Evaluation is limited as the patient could not cooperate with the exam. Within this limitation, no evidence of acute or subacute infarct. Electronically Signed   By: Wiliam Ke M.D.   On: 05/27/2023 13:52   Overnight EEG with video Result Date: 05/26/2023 Charlsie Quest, MD     05/26/2023  9:36 AM Patient Name: DAEJON LICH MRN: [id number] Epilepsy Attending: Charlsie Quest Referring Physician/Provider: Erick Blinks, MD Duration: 05/25/2023 1121 to 05/26/2023 4098 Patient history: 73 y.o. male with hx of alcohol use disorder who apparently had a fall at home and passed out following a bout of cough. Was talking to wife after the fall and then had a decline in mentation and not doing much for EMS and intubated upon arrival to the ED. EEG to evaluate for seizure Level of alertness: Awake, asleep AEDs during EEG study: Phenobarb Technical aspects: This EEG study was done with scalp electrodes positioned according to the 10-20 International system of electrode placement. Electrical activity was reviewed with band pass filter of 1-70Hz , sensitivity of 7 uV/mm, display speed of 46mm/sec with a 60Hz  notched filter applied as appropriate. EEG data were recorded continuously and digitally stored.  Video monitoring was available and reviewed as appropriate. Description: The posterior dominant rhythm consists of 8.5 Hz activity of moderate  voltage (25-35 uV) seen predominantly in posterior head regions, symmetric and reactive to eye opening and eye closing. Sleep was characterized by vertex waves, sleep spindles (12 to 14 Hz), maximal frontocentral region. Hyperventilation and photic stimulation were not performed.   IMPRESSION: This study is within normal limits. No seizures or epileptiform discharges were seen throughout the recording. A normal interictal EEG does not exclude the diagnosis of epilepsy. Charlsie Quest   ECHOCARDIOGRAM COMPLETE Result Date: 05/25/2023    ECHOCARDIOGRAM REPORT   Patient Name:   [name] Date of Exam: 05/25/2023 Medical Rec #:  [id number]         Height:  71.0 in Accession #:    [id number]        Weight:       213.8 lb Date of Birth:  1950/10/08         BSA:          2.169 m Patient Age:    72 years          BP:           143/84 mmHg Patient Gender: M                 HR:           59 bpm. Exam Location:  Inpatient Procedure: 2D Echo, Color Doppler and Cardiac Doppler Indications:    pulmonary embolus  History:        Patient has prior history of Echocardiogram examinations, most                 recent 02/01/2016. COPD; Risk Factors:Hypertension and                 Dyslipidemia.  Sonographer:    Delcie Roch RDCS Referring Phys: [id number] Lorin Glass  Sonographer Comments: Technically difficult study due to poor echo windows. Image acquisition challenging due to respiratory motion. IMPRESSIONS  1. Left ventricular ejection fraction, by estimation, is 55 to 60%. The left ventricle has normal function. The left ventricle has no regional wall motion abnormalities. Left ventricular diastolic parameters were normal.  2. Right ventricular systolic function is normal. The right ventricular size is not well visualized.  3. The mitral valve is normal in structure. No evidence of mitral valve regurgitation. No evidence of mitral stenosis.  4. The aortic valve was not well visualized. Aortic valve regurgitation  is not visualized. No aortic stenosis is present.  5. The inferior vena cava is dilated in size with >50% respiratory variability, suggesting right atrial pressure of 8 mmHg. Comparison(s): No significant change from prior study. Technically challenging images due to poor windows, but no significant changes from prior study noted. FINDINGS  Left Ventricle: Left ventricular ejection fraction, by estimation, is 55 to 60%. The left ventricle has normal function. The left ventricle has no regional wall motion abnormalities. The left ventricular internal cavity size was normal in size. There is  no left ventricular hypertrophy. Left ventricular diastolic parameters were normal. Right Ventricle: The right ventricular size is not well visualized. Right vetricular wall thickness was not well visualized. Right ventricular systolic function is normal. Left Atrium: Left atrial size was normal in size. Right Atrium: Right atrial size was not well visualized. Pericardium: There is no evidence of pericardial effusion. Mitral Valve: The mitral valve is normal in structure. No evidence of mitral valve regurgitation. No evidence of mitral valve stenosis. Tricuspid Valve: The tricuspid valve is not well visualized. Tricuspid valve regurgitation is not demonstrated. No evidence of tricuspid stenosis. Aortic Valve: The aortic valve was not well visualized. Aortic valve regurgitation is not visualized. No aortic stenosis is present. Pulmonic Valve: The pulmonic valve was not well visualized. Pulmonic valve regurgitation is not visualized. No evidence of pulmonic stenosis. Aorta: The aortic root and ascending aorta are structurally normal, with no evidence of dilitation. Venous: The inferior vena cava is dilated in size with greater than 50% respiratory variability, suggesting right atrial pressure of 8 mmHg. IAS/Shunts: The interatrial septum was not well visualized.  LEFT VENTRICLE PLAX 2D LVIDd:         4.40 cm   Diastology LVIDs:  3.00 cm   LV e' medial:    7.51 cm/s LV PW:         1.00 cm   LV E/e' medial:  9.4 LV IVS:        1.00 cm   LV e' lateral:   9.90 cm/s LVOT diam:     1.90 cm   LV E/e' lateral: 7.1 LV SV:         72 LV SV Index:   33 LVOT Area:     2.84 cm  RIGHT VENTRICLE         IVC TAPSE (M-mode): 1.9 cm  IVC diam: 2.50 cm LEFT ATRIUM             Index LA diam:        3.60 cm 1.66 cm/m LA Vol (A2C):   49.5 ml 22.82 ml/m LA Vol (A4C):   53.8 ml 24.80 ml/m LA Biplane Vol: 53.7 ml 24.75 ml/m  AORTIC VALVE LVOT Vmax:   140.00 cm/s LVOT Vmean:  92.400 cm/s LVOT VTI:    0.255 m  AORTA Ao Root diam: 3.40 cm Ao Asc diam:  3.50 cm MV E velocity: 70.40 cm/s MV A velocity: 73.10 cm/s  SHUNTS MV E/A ratio:  0.96        Systemic VTI:  0.26 m                            Systemic Diam: 1.90 cm Jodelle Red MD Electronically signed by Jodelle Red MD Signature Date/Time: 05/25/2023/7:29:22 PM    Final    VAS [place] LOWER EXTREMITY VENOUS (DVT) Result Date: 05/25/2023  Lower Venous DVT Study Patient Name:  BRADIN MCADORY  Date of Exam:   05/25/2023 Medical Rec #: [id number]          Accession #:    [id number] Date of Birth: May 08, 1950          Patient Gender: M Patient Age:   34 years Exam Location:  [facility] Procedure:      VAS [place] LOWER EXTREMITY VENOUS (DVT) Referring Phys: Mertha Baars --------------------------------------------------------------------------------  Indications: Syncope.  Risk Factors: None identified. Comparison Study: None. Performing Technologist: Shona Simpson  Examination Guidelines: A complete evaluation includes B-mode imaging, spectral Doppler, color Doppler, and power Doppler as needed of all accessible portions of each vessel. Bilateral testing is considered an integral part of a complete examination. Limited examinations for reoccurring indications may be performed as noted. The reflux portion of the exam is performed with the patient in reverse Trendelenburg.   +---------+---------------+---------+-----------+----------+--------------+ RIGHT    CompressibilityPhasicitySpontaneityPropertiesThrombus Aging +---------+---------------+---------+-----------+----------+--------------+ CFV      Full           Yes      Yes                                 +---------+---------------+---------+-----------+----------+--------------+ SFJ      Full                                                        +---------+---------------+---------+-----------+----------+--------------+ FV Prox  Full                                                        +---------+---------------+---------+-----------+----------+--------------+  FV Mid   Full                                                        +---------+---------------+---------+-----------+----------+--------------+ FV DistalFull                                                        +---------+---------------+---------+-----------+----------+--------------+ PFV      Full                                                        +---------+---------------+---------+-----------+----------+--------------+ POP      Full           Yes      Yes                                 +---------+---------------+---------+-----------+----------+--------------+ PTV      Full                                                        +---------+---------------+---------+-----------+----------+--------------+ PERO     Full                                                        +---------+---------------+---------+-----------+----------+--------------+   +---------+---------------+---------+-----------+----------+--------------+ LEFT     CompressibilityPhasicitySpontaneityPropertiesThrombus Aging +---------+---------------+---------+-----------+----------+--------------+ CFV      Full           Yes      Yes                                  +---------+---------------+---------+-----------+----------+--------------+ SFJ      Full                                                        +---------+---------------+---------+-----------+----------+--------------+ FV Prox  Full                                                        +---------+---------------+---------+-----------+----------+--------------+ FV Mid   Full                                                        +---------+---------------+---------+-----------+----------+--------------+  FV DistalFull                                                        +---------+---------------+---------+-----------+----------+--------------+ PFV      Full                                                        +---------+---------------+---------+-----------+----------+--------------+ POP      Full           Yes      Yes                                 +---------+---------------+---------+-----------+----------+--------------+ PTV      Full                                                        +---------+---------------+---------+-----------+----------+--------------+ PERO     Full                                                        +---------+---------------+---------+-----------+----------+--------------+     Summary: BILATERAL: - No evidence of deep vein thrombosis seen in the lower extremities, bilaterally. -No evidence of popliteal cyst, bilaterally.   *See table(s) above for measurements and observations. Electronically signed by Sherald Hess MD on 05/25/2023 at 3:19:35 PM.    Final    CT ANGIO HEAD NECK W WO CM Result Date: 05/25/2023 CLINICAL DATA:  Neuro deficit, acute, stroke suspected EXAM: CT ANGIOGRAPHY HEAD AND NECK WITH AND WITHOUT CONTRAST TECHNIQUE: Multidetector CT imaging of the head and neck was performed using the standard protocol during bolus administration of intravenous contrast. Multiplanar CT image reconstructions and  MIPs were obtained to evaluate the vascular anatomy. Carotid stenosis measurements (when applicable) are obtained utilizing NASCET criteria, using the distal internal carotid diameter as the denominator. RADIATION DOSE REDUCTION: This exam was performed according to the departmental dose-optimization program which includes automated exposure control, adjustment of the mA and/or kV according to patient size and/or use of iterative reconstruction technique. CONTRAST:  75mL OMNIPAQUE IOHEXOL 350 MG/ML SOLN COMPARISON:  None Available. FINDINGS: CT HEAD FINDINGS Brain: No evidence of acute infarction, hemorrhage, hydrocephalus, extra-axial collection or mass lesion/mass effect. Vascular: See below. Skull: No acute fracture. Sinuses/Orbits: Mucosal thickening with air-fluid level in the right maxillary sinus. Other: No mastoid effusions. Review of the MIP images confirms the above findings CTA NECK FINDINGS Aortic arch: Great vessel origins are patent without significant stenosis. Right carotid system: No evidence of dissection, stenosis (50% or greater), or occlusion. Left carotid system: No evidence of dissection, stenosis (50% or greater), or occlusion. Vertebral arteries: Codominant. No evidence of dissection, stenosis (50% or greater), or occlusion. Skeleton: No acute abnormality on limited assessment. Other neck: No acute abnormality on limited assessment. Upper  chest: Visualized lung apices are clear. Review of the MIP images confirms the above findings CTA HEAD FINDINGS Anterior circulation: Bilateral intracranial ICAs, MCAs, and ACAs are patent without proximal hemodynamically significant stenosis. Posterior circulation: Bilateral intradural vertebral arteries, basilar artery and bilateral posterior cerebral arteries are patent without proximal hemodynamically significant stenosis. Left fetal type PCA. Venous sinuses: As permitted by contrast timing, patent. Anatomic variants: A detailed above. Review of the MIP  images confirms the above findings IMPRESSION: 1. No evidence of acute intracranial abnormality. 2. No emergent large vessel occlusion or proximal hemodynamically significant stenosis. Electronically Signed   By: Feliberto Harts M.D.   On: 05/25/2023 02:54   CT CERVICAL SPINE WO CONTRAST Result Date: 05/25/2023 CLINICAL DATA:  Neck trauma, intoxicated or obtunded (Age >= 16y). Fall, found down EXAM: CT CERVICAL SPINE WITHOUT CONTRAST TECHNIQUE: Multidetector CT imaging of the cervical spine was performed without intravenous contrast. Multiplanar CT image reconstructions were also generated. RADIATION DOSE REDUCTION: This exam was performed according to the departmental dose-optimization program which includes automated exposure control, adjustment of the mA and/or kV according to patient size and/or use of iterative reconstruction technique. COMPARISON:  None Available. FINDINGS: Alignment: Normal. Skull base and vertebrae: No acute fracture. No primary bone lesion or focal pathologic process. Soft tissues and spinal canal: No prevertebral fluid or swelling. No visible canal hematoma. Disc levels: Diffuse intervertebral disc space narrowing and endplate remodeling is seen throughout the cervical spine, most severe at C4-C7 in keeping with changes of to severe degenerative disc disease. Prevertebral soft tissues are not thickened. Endotracheal tube and nasogastric tube ir identified. Mild central canal stenosis, most severe at C4-5 secondary to a posterior disc osteophyte complex with abutment of the thecal sac without significant. Multilevel uncovertebral and facet arthrosis is seen with resultant moderate severe bilateral neuroforaminal narrowing, most severe at C5-6 Upper chest: Negative. Other: None IMPRESSION: 1. No acute fracture or listhesis of the cervical spine. 2. Multilevel degenerative disc and degenerative joint disease resulting in mild central canal stenosis and moderate to severe bilateral  neuroforaminal narrowing, most severe at C5-6. Electronically Signed   By: Helyn Numbers M.D.   On: 05/25/2023 02:52   DG Abd 1 View Result Date: 05/25/2023 CLINICAL DATA:  Orogastric tube placement EXAM: ABDOMEN - 1 VIEW COMPARISON:  None Available. FINDINGS: The bowel gas pattern is normal. No radio-opaque calculi or other significant radiographic abnormality are seen. Orogastric tube tip and side port project in the stomach. IMPRESSION: Orogastric tube tip and side port project in the stomach. Electronically Signed   By: Deatra Robinson M.D.   On: 05/25/2023 02:29   DG Chest Portable 1 View Result Date: 05/25/2023 CLINICAL DATA:  Intubation EXAM: PORTABLE CHEST 1 VIEW COMPARISON:  02/01/2019 FINDINGS: Support Apparatus: --Endotracheal tube: Tip at the level of the clavicular heads. --Enteric tube:Tip and sideport project over the stomach. --Vascular catheter(s):None --Other: None Bibasilar atelectasis, left-greater-than-right. No focal airspace consolidation. No pleural effusion or pneumothorax. IMPRESSION: 1. Endotracheal tube tip at the level of the clavicular heads. 2. Enteric tube tip and sideport project over the stomach. Electronically Signed   By: Deatra Robinson M.D.   On: 05/25/2023 02:28    Subjective: Patient was seen and examined at bedside. Overnight events noted.   Patient reports doing much better and wants to be discharged.   PT and OT recommended home health services which were arranged.  Discharge Exam: Vitals:   06/09/23 0903 06/09/23 0904  BP:    Pulse:  Resp:    Temp:    SpO2: 97% 98%   Vitals:   06/09/23 0722 06/09/23 0901 06/09/23 0903 06/09/23 0904  BP: 125/78     Pulse:      Resp: 16     Temp: 97.8 F (36.6 C)     TempSrc: Oral     SpO2: 97% 97% 97% 98%  Weight:      Height:        General: Pt is alert, awake, not in acute distress Cardiovascular: RRR, S1/S2 +, no rubs, no gallops Respiratory: CTA bilaterally, no wheezing, no rhonchi Abdominal: Soft,  NT, ND, bowel sounds + Extremities: no edema, no cyanosis    The results of significant diagnostics from this hospitalization (including imaging, microbiology, ancillary and laboratory) are listed below for reference.     Microbiology: No results found for this or any previous visit (from the past 240 hours).   Labs: BNP (last 3 results) Recent Labs    05/25/23 0421  BNP 48.0   Basic Metabolic Panel: Recent Labs  Lab 06/03/23 0343 06/04/23 0211 06/05/23 0317 06/08/23 0315  NA 138 138 140 139  K 4.2 3.1* 4.3 3.7  CL 101 104 104 104  CO2 20* 22 27 26   GLUCOSE 96 120* 100* 132*  BUN 15 14 13 15   CREATININE 0.92 1.02 0.94 1.07  CALCIUM 8.9 8.9 9.1 9.5  MG  --  1.8  --  1.9  PHOS  --   --   --  3.2   Liver Function Tests: Recent Labs  Lab 06/03/23 1041 06/08/23 0315  AST 43* 37  ALT 56* 57*  ALKPHOS 57 52  BILITOT 1.1 0.8  PROT 6.9 6.6  ALBUMIN 3.5 3.2*   No results for input(s): "LIPASE", "AMYLASE" in the last 168 hours. Recent Labs  Lab 06/03/23 1041 06/05/23 2346  AMMONIA 20 19   CBC: Recent Labs  Lab 06/03/23 0343 06/04/23 0211 06/08/23 0315  WBC 9.9 7.4 9.0  HGB 15.3 14.3 13.9  HCT 43.3 40.5 40.5  MCV 102.9* 103.3* 103.6*  PLT 193 250 360   Cardiac Enzymes: No results for input(s): "CKTOTAL", "CKMB", "CKMBINDEX", "TROPONINI" in the last 168 hours. BNP: Invalid input(s): "POCBNP" CBG: Recent Labs  Lab 06/05/23 0307 06/05/23 0710 06/05/23 1059 06/05/23 1524 06/05/23 1932  GLUCAP 89 117* 111* 105* 150*   D-Dimer No results for input(s): "DDIMER" in the last 72 hours. Hgb A1c No results for input(s): "HGBA1C" in the last 72 hours. Lipid Profile No results for input(s): "CHOL", "HDL", "LDLCALC", "TRIG", "CHOLHDL", "LDLDIRECT" in the last 72 hours. Thyroid function studies No results for input(s): "TSH", "T4TOTAL", "T3FREE", "THYROIDAB" in the last 72 hours.  Invalid input(s): "FREET3" Anemia work up No results for input(s):  "VITAMINB12", "FOLATE", "FERRITIN", "TIBC", "IRON", "RETICCTPCT" in the last 72 hours. Urinalysis    Component Value Date/Time   COLORURINE YELLOW 05/25/2023 0246   APPEARANCEUR CLEAR 05/25/2023 0246   LABSPEC 1.010 05/25/2023 0246   PHURINE 6.0 05/25/2023 0246   GLUCOSEU NEGATIVE 05/25/2023 0246   HGBUR NEGATIVE 05/25/2023 0246   BILIRUBINUR NEGATIVE 05/25/2023 0246   KETONESUR NEGATIVE 05/25/2023 0246   PROTEINUR NEGATIVE 05/25/2023 0246   UROBILINOGEN 0.2 03/21/2011 1438   NITRITE NEGATIVE 05/25/2023 0246   LEUKOCYTESUR NEGATIVE 05/25/2023 0246   Sepsis Labs Recent Labs  Lab 06/03/23 0343 06/04/23 0211 06/08/23 0315  WBC 9.9 7.4 9.0   Microbiology No results found for this or any previous visit (from the past 240 hours).  Time coordinating discharge: Over 30 minutes  SIGNED:   Willeen Niece, MD  [facility] 06/09/2023, 11:16 AM Pager   If 7PM-7AM, please contact night-coverage

## 2023-06-08 NOTE — Progress Notes (Signed)
 SLP Cancellation Note  Patient Details Name: Ralph [name] MRN: [id number] DOB: 06/27/1950   Cancelled treatment:       Reason Eval/Treat Not Completed: Patient at procedure or test/unavailable- pt with OT. Will f/u.  Jovane Foutz L. Samson Frederic, MA CCC/SLP Clinical Specialist - Acute Care SLP Acute Rehabilitation Services Office number [phone]    Blenda Mounts Laurice 06/08/2023, 3:33 PM

## 2023-06-08 NOTE — Progress Notes (Signed)
 PROGRESS NOTE    Ralph [name]  WUJ:[id number] DOB: May 22, 1950 DOA: 05/25/2023 PCP: Sigmund Hazel, MD   Brief Narrative: This 73 year old male with PMH significant for hyperlipidemia, GERD, asthma, alcohol use disorder who was brought by family , He was found unresponsive. He was in respiratory distress. He required BVM assistance by EMS.  Significant other notes that he had a syncopal episode one day prior, had some coughing . She states these episodes seemed to happen after he coughs, feels mildly short of breath "like his asthma is starting up" and then falls to the floor.  Patient had similar episode, he felt like he was going to die.  Patient drinks alcohol daily,  he denies any recent alcohol withdrawals. He reports asthma is under control with his daily maintenance medication.  Patient was intubated for airway protection and admitted in the ICU.  He required Precedex for agitation.  Successfully extubated now remains on phenobarb taper and Seroquel. TRH pickup 06/05/2023.  Assessment & Plan:   Principal Problem:   Respiratory arrest (HCC) Active Problems:   Malnutrition of moderate degree  Alcohol withdrawal: Hospital delirium: Patient presented with agitation and respiratory distress requiring intubation. Patient became acutely agitated on the night of 2/6, approximately 3 days after last drink. Intubated 2/6 for airway protection, extubated on 2/7. CIWA overnight was 4-10.  Completed phenobarb taper. Continue Seroquel 100 mg at bedtime. Successfully off Precedex . Continue thiamine, folic acid, Multivitamin. Continue CIWA . Delirium precautions, keep awake/active during day   Acute hypoxic respiratory failure requiring mechanical ventilation: Asthma without exacerbation: He was changed from [facility] / incruse to triple neb therapy.  Now remains on room air. Repeat CXR with bibasilar atelectasis vs mild edema. Continue inspiratory spirometry and flutter. Hepatic function  panel > minimal elevated of LFTs -Ammonia normal   Syncope:  He was evaluated by Neurology.  Workup benign and they have signed off. Continue Supportive care   GERD: Continue pantoprazole.   Deconditioning: Working with PT, possible CIR evaluation   Bilateral Foot Erythema /  Pressure Injury Stage I Erythema and mild swelling of bilateral feet, wonder if injury in bed while agitated against bed rails. If fevers or leukocytosis and concern for infectious process, could initiate abx. Xray showed remote fractures bilaterally. No fever, No leukocytosis  Continue tylenol PRN and voltaren gel QID Prevalon boots.    DVT prophylaxis: Heparin Code Status: Full code Family Communication: Wife at bed side. Disposition Plan:   Status is: Inpatient Remains inpatient appropriate because: Medically stable, awaiting CIR. Firefighter pending  Consultants:  PCCM  Procedures: Intubated > extubated.  Antimicrobials: Anti-infectives (From admission, onward)    Start     Dose/Rate Route Frequency Ordered Stop   05/28/23 1000  imipenem-cilastatin (PRIMAXIN) 500 mg in sodium chloride 0.9 % 100 mL IVPB  Status:  Discontinued        500 mg 200 mL/hr over 30 Minutes Intravenous Every 12 hours 05/28/23 0737 05/28/23 0738   05/25/23 1545  doxycycline (VIBRA-TABS) tablet 100 mg  Status:  Discontinued        100 mg Oral Every 12 hours 05/25/23 1446 05/26/23 1131      Subjective: Patient was seen and examined at bedside.Overnight events noted.  Patient feels much improved, denies any agitation or restlessness. Patient remains on CIWA protocol.  Awaiting CIR insurance authorization.  Objective: Vitals:   06/08/23 0410 06/08/23 0721 06/08/23 0746 06/08/23 1112  BP:  137/73  (!) 97/45  Pulse:  Resp:  19  18  Temp:  97.8 F (36.6 C)  97.8 F (36.6 C)  TempSrc:  Oral  Oral  SpO2:  98% 100% 97%  Weight: 81.7 kg     Height:        Intake/Output Summary (Last 24 hours) at 06/08/2023  1202 Last data filed at 06/07/2023 2214 Gross per 24 hour  Intake 240 ml  Output 400 ml  Net -160 ml   Filed Weights   06/06/23 0307 06/07/23 0632 06/08/23 0410  Weight: 81.9 kg 81.6 kg 81.7 kg    Examination:  General exam: Appears comfortable, deconditioned, not in any acute distress. Respiratory system: CTA Bilaterally . Respiratory effort normal.  RR 15 Cardiovascular system: S1 & S2 heard, RRR. No JVD, murmurs, rubs, gallops or clicks.  Gastrointestinal system: Abdomen is non distended, soft and non tender. Normal bowel sounds heard. Central nervous system: Alert and oriented x 2. No focal neurological deficits. Extremities: No edema, no cyanosis, no clubbing. Skin: No rashes, lesions or ulcers Psychiatry: Judgement and insight appear normal. Mood & affect appropriate.     Data Reviewed: I have personally reviewed following labs and imaging studies  CBC: Recent Labs  Lab 06/03/23 0343 06/04/23 0211 06/08/23 0315  WBC 9.9 7.4 9.0  HGB 15.3 14.3 13.9  HCT 43.3 40.5 40.5  MCV 102.9* 103.3* 103.6*  PLT 193 250 360   Basic Metabolic Panel: Recent Labs  Lab 06/02/23 0632 06/02/23 0839 06/03/23 0343 06/04/23 0211 06/05/23 0317 06/08/23 0315  NA  --  141 138 138 140 139  K  --  3.7 4.2 3.1* 4.3 3.7  CL  --  107 101 104 104 104  CO2  --  22 20* 22 27 26   GLUCOSE  --  93 96 120* 100* 132*  BUN  --  18 15 14 13 15   CREATININE  --  1.11 0.92 1.02 0.94 1.07  CALCIUM  --  9.3 8.9 8.9 9.1 9.5  MG 2.0  --   --  1.8  --  1.9  PHOS  --   --   --   --   --  3.2   GFR: Estimated Creatinine Clearance: 66.5 mL/min (by C-G formula based on SCr of 1.07 mg/dL). Liver Function Tests: Recent Labs  Lab 06/03/23 1041 06/08/23 0315  AST 43* 37  ALT 56* 57*  ALKPHOS 57 52  BILITOT 1.1 0.8  PROT 6.9 6.6  ALBUMIN 3.5 3.2*   No results for input(s): "LIPASE", "AMYLASE" in the last 168 hours. Recent Labs  Lab 06/03/23 1041 06/05/23 2346  AMMONIA 20 19   Coagulation  Profile: No results for input(s): "INR", "PROTIME" in the last 168 hours. Cardiac Enzymes: No results for input(s): "CKTOTAL", "CKMB", "CKMBINDEX", "TROPONINI" in the last 168 hours. BNP (last 3 results) No results for input(s): "PROBNP" in the last 8760 hours. HbA1C: No results for input(s): "HGBA1C" in the last 72 hours. CBG: Recent Labs  Lab 06/05/23 0307 06/05/23 0710 06/05/23 1059 06/05/23 1524 06/05/23 1932  GLUCAP 89 117* 111* 105* 150*   Lipid Profile: No results for input(s): "CHOL", "HDL", "LDLCALC", "TRIG", "CHOLHDL", "LDLDIRECT" in the last 72 hours. Thyroid Function Tests: No results for input(s): "TSH", "T4TOTAL", "FREET4", "T3FREE", "THYROIDAB" in the last 72 hours. Anemia Panel: No results for input(s): "VITAMINB12", "FOLATE", "FERRITIN", "TIBC", "IRON", "RETICCTPCT" in the last 72 hours. Sepsis Labs: No results for input(s): "PROCALCITON", "LATICACIDVEN" in the last 168 hours.  No results found for this or any  previous visit (from the past 240 hours).   Radiology Studies: No results found.  Scheduled Meds:  arformoterol  15 mcg Nebulization BID   budesonide (PULMICORT) nebulizer solution  0.5 mg Nebulization BID   Chlorhexidine Gluconate Cloth  6 each Topical Daily   diclofenac Sodium  4 g Topical QID   escitalopram  20 mg Oral Daily   feeding supplement  237 mL Oral BID BM   folic acid  1 mg Oral Daily   heparin  5,000 Units Subcutaneous Q8H   multivitamin with minerals  1 tablet Oral Daily   pantoprazole  40 mg Oral Daily   pneumococcal 20-valent conjugate vaccine  0.5 mL Intramuscular Tomorrow-1000   polyethylene glycol  17 g Oral BID   QUEtiapine  100 mg Oral QHS   revefenacin  175 mcg Nebulization Daily   senna-docusate  1 tablet Oral BID   sodium chloride flush  10-40 mL Intracatheter Q12H   thiamine  100 mg Oral Daily   Continuous Infusions:   LOS: 14 days    Time spent: 35 mins    Willeen Niece, MD [facility]   If  7PM-7AM, please contact night-coverage

## 2023-06-08 NOTE — Progress Notes (Signed)
 Physical Therapy Treatment Patient Details Name: Ralph [name] MRN: [id number] DOB: 1950/08/09 Today's Date: 06/08/2023   History of Present Illness 73 Y/O male with respiratory arrest admitted 2/3, developed etoh withdrawal, DTs. Intubated 2/6-2/7.  PMHx: hyperlipidemia, GERD, asthma, alcohol use.    PT Comments  Pt much improved both cognitively and functionally compared to last PT session on 2/12. PTA pt was indep without AD however pt now requiring minA and use of RW for safe ambulation. Pt continues to demonstrate mild confusion, impaired sequencing, delayed response time, decreased insight to safety, and remains a high fall risk. Continue to recommend aggressive inpatient rehab program > 3 hrs a day to address both cognitive and functional deficits for maximal functional recovery for safe transition home with spouse. Acute PT to cont to follow.    If plan is discharge home, recommend the following: Assistance with cooking/housework;Direct supervision/assist for medications management;Direct supervision/assist for financial management;Assist for transportation;Supervision due to cognitive status;Two people to help with walking and/or transfers;Two people to help with bathing/dressing/bathroom   Can travel by private vehicle        Equipment Recommendations  None recommended by PT    Recommendations for Other Services Rehab consult     Precautions / Restrictions Precautions Precautions: Fall Restrictions Weight Bearing Restrictions Per Provider Order: No     Mobility  Bed Mobility Overal bed mobility: Needs Assistance Bed Mobility: Supine to Sit     Supine to sit: Contact guard     General bed mobility comments: HOB elevated, pt able to bring self to EOB without physical assist, no use of bed rails    Transfers Overall transfer level: Needs assistance Equipment used: Rolling walker (2 wheels) Transfers: Sit to/from Stand Sit to Stand: Min assist            General transfer comment: repeated verbal cues to push up from bed not pull up on walker, minA to power up and stedy during transition of hands from bed to RW    Ambulation/Gait Ambulation/Gait assistance: Min assist Gait Distance (Feet): 200 Feet Assistive device: Rolling walker (2 wheels) Gait Pattern/deviations: Wide base of support, Step-through pattern Gait velocity: dec Gait velocity interpretation: <1.31 ft/sec, indicative of household ambulator   General Gait Details: pt with increased bilat UE dependence for balance, verbal cues for walker safety during turning. pt able to clear bilat feet and denies pain in either foot this date   Stairs             Wheelchair Mobility     Tilt Bed    Modified Rankin (Stroke Patients Only)       Balance Overall balance assessment: Needs assistance Sitting-balance support: Feet supported, Bilateral upper extremity supported Sitting balance-Leahy Scale: Good Sitting balance - Comments: able to maintain midline position   Standing balance support: Reliant on assistive device for balance, During functional activity, Bilateral upper extremity supported Standing balance-Leahy Scale: Fair Standing balance comment: pt able to stand and tie pant drawstring with wide base of support and contact guard however unable to take challenge                            Communication Communication Communication: No apparent difficulties  Cognition Arousal: Alert Behavior During Therapy: WFL for tasks assessed/performed   PT - Cognitive impairments: No family/caregiver present to determine baseline, Safety/Judgement, Problem solving, Attention, Sequencing  PT - Cognition Comments: pt stating November for date, aware in hospital, noted impaired sequencing during sit to stand up to RW and kicking out LEs for MMT Following commands: Impaired Following commands impaired: Follows one step commands with  increased time, Follows multi-step commands inconsistently    Cueing Cueing Techniques: Verbal cues, Gestural cues, Tactile cues, Visual cues  Exercises      General Comments General comments (skin integrity, edema, etc.): VSS on RA, L foot swollen      Pertinent Vitals/Pain Pain Assessment Pain Assessment: No/denies pain Pain Location: denies foot pain this date    Home Living                          Prior Function            PT Goals (current goals can now be found in the care plan section) Acute Rehab PT Goals Patient Stated Goal: home PT Goal Formulation: Patient unable to participate in goal setting Time For Goal Achievement: 06/12/23 Potential to Achieve Goals: Good Progress towards PT goals: Progressing toward goals    Frequency    Min 1X/week      PT Plan      Co-evaluation              AM-PAC PT "6 Clicks" Mobility   Outcome Measure  Help needed turning from your back to your side while in a flat bed without using bedrails?: A Little Help needed moving from lying on your back to sitting on the side of a flat bed without using bedrails?: A Little Help needed moving to and from a bed to a chair (including a wheelchair)?: A Little Help needed standing up from a chair using your arms (e.g., wheelchair or bedside chair)?: A Little Help needed to walk in hospital room?: A Little Help needed climbing 3-5 steps with a railing? : A Lot 6 Click Score: 17    End of Session Equipment Utilized During Treatment: Gait belt Activity Tolerance: Patient tolerated treatment well Patient left: in bed;with call bell/phone within reach;with bed alarm set;with restraints reapplied (posey belt reapplied) Nurse Communication: Mobility status PT Visit Diagnosis: Unsteadiness on feet (R26.81);Difficulty in walking, not elsewhere classified (R26.2);Other symptoms and signs involving the nervous system (R29.898)     Time: 8119-1478 PT Time Calculation (min)  (ACUTE ONLY): 21 min  Charges:    $Gait Training: 8-22 mins PT General Charges $$ ACUTE PT VISIT: 1 Visit                     Lewis Shock, PT, DPT Acute Rehabilitation Services Secure chat preferred Office #: [phone]    Iona Hansen 06/08/2023, 9:11 AM

## 2023-06-09 ENCOUNTER — Telehealth (HOSPITAL_COMMUNITY): Payer: Self-pay | Admitting: Pharmacy Technician

## 2023-06-09 ENCOUNTER — Other Ambulatory Visit (HOSPITAL_COMMUNITY): Payer: Self-pay

## 2023-06-09 DIAGNOSIS — R092 Respiratory arrest: Secondary | ICD-10-CM | POA: Diagnosis not present

## 2023-06-09 NOTE — Progress Notes (Signed)
 SLP Cancellation Note  Patient Details Name: Ralph [name] MRN: [id number] DOB: 1950-12-24   Cancelled treatment:        Sherron Monday with MD and RN. Pt is answering questions, following directions, and no longer requires restraints.  Pt has returned to cognitive baseline and has no ST needs. Plans for d/c today.  SLP will defer evaluation at this time.     Kerrie Pleasure, MA, CCC-SLP Acute Rehabilitation Services Office: [phone] 06/09/2023, 9:36 AM

## 2023-06-09 NOTE — TOC Transition Note (Addendum)
 Transition of Care [facility]) - Discharge Note   Patient Details  Name: Ralph [name] MRN: [id number] Date of Birth: [birth year]  Transition [facility]) CM/SW Contact:  Tom-Johnson, Hershal Coria, RN Phone Number: 06/09/2023, 9:54 AM   Clinical Narrative:     Patient is scheduled for discharge today.  Readmission Risk Assessment done. Home health info, hospital f/u and discharge instructions on AVS. Wife, Ralph [name] to transport at discharge.  No further TOC needs noted.  10:25- RW ordered, [employer] to deliver to patient at bedside. No further TOC needs noted.       Final next level of care: Home w Home Health Services Barriers to Discharge: Barriers Resolved   Patient Goals and CMS Choice Patient states their goals for this hospitalization and ongoing recovery are:: To return home CMS Medicare.gov Compare Post Acute Care list provided to:: Patient Choice offered to / list presented to : Patient, Spouse      Discharge Placement                Patient to be transferred to facility by: Wife Name of family member notified: [facility]    Discharge Plan and Services Additional resources added to the After Visit Summary for   In-house Referral: Clinical Social Work Discharge Planning Services: CM Consult            DME Arranged: N/A DME Agency: NA       HH Arranged: PT, OT HH Agency: [facility] Date [facility] Agency Contacted: 06/09/23 Time HH Agency Contacted: [phone] Representative spoke with at [facility] Agency: Kandee Keen  Social Drivers of Health (SDOH) Interventions SDOH Screenings   Food Insecurity: Patient Unable To Answer (05/25/2023)  Housing: Patient Unable To Answer (05/25/2023)  Transportation Needs: Patient Unable To Answer (05/25/2023)  Utilities: Patient Unable To Answer (05/25/2023)  Social Connections: Patient Unable To Answer (05/25/2023)  Tobacco Use: Medium Risk (06/05/2023)     Readmission Risk Interventions    06/09/2023    9:53 AM  Readmission Risk  Prevention Plan  Transportation Screening Complete  PCP or Specialist Appt within 5-7 Days Complete  Home Care Screening Complete  Medication Review (RN CM) Referral to Pharmacy

## 2023-06-09 NOTE — Progress Notes (Signed)
 PICC Removal Note: PICC line removed from RUE. PICC catheter tip visualized and intact. Pressure held until hemostasis achieved. Pressure dressing applied. No redness, ecchymosis, edema, swelling, or drainage noted at site. Instructions provided on post PICC discharge care, including followup notification instructions.  Bedrest until 1130.

## 2023-06-09 NOTE — Plan of Care (Signed)
  Problem: Safety: Goal: Non-violent Restraint(s) Outcome: Progressing   Problem: Education: Goal: Ability to describe self-care measures that may prevent or decrease complications (Diabetes Survival Skills Education) will improve Outcome: Progressing Goal: Individualized Educational Video(s) Outcome: Progressing   Problem: Coping: Goal: Ability to adjust to condition or change in health will improve Outcome: Progressing   Problem: Fluid Volume: Goal: Ability to maintain a balanced intake and output will improve Outcome: Progressing   Problem: Health Behavior/Discharge Planning: Goal: Ability to identify and utilize available resources and services will improve Outcome: Progressing Goal: Ability to manage health-related needs will improve Outcome: Progressing   Problem: Metabolic: Goal: Ability to maintain appropriate glucose levels will improve Outcome: Progressing   Problem: Nutritional: Goal: Maintenance of adequate nutrition will improve Outcome: Progressing Goal: Progress toward achieving an optimal weight will improve Outcome: Progressing   Problem: Skin Integrity: Goal: Risk for impaired skin integrity will decrease Outcome: Progressing   Problem: Tissue Perfusion: Goal: Adequacy of tissue perfusion will improve Outcome: Progressing   Problem: Education: Goal: Knowledge of General Education information will improve Description: Including pain rating scale, medication(s)/side effects and non-pharmacologic comfort measures Outcome: Progressing   Problem: Health Behavior/Discharge Planning: Goal: Ability to manage health-related needs will improve Outcome: Progressing   Problem: Clinical Measurements: Goal: Ability to maintain clinical measurements within normal limits will improve Outcome: Progressing Goal: Will remain free from infection Outcome: Progressing Goal: Diagnostic test results will improve Outcome: Progressing Goal: Respiratory complications  will improve Outcome: Progressing Goal: Cardiovascular complication will be avoided Outcome: Progressing   Problem: Activity: Goal: Risk for activity intolerance will decrease Outcome: Progressing   Problem: Nutrition: Goal: Adequate nutrition will be maintained Outcome: Progressing   Problem: Coping: Goal: Level of anxiety will decrease Outcome: Progressing   Problem: Elimination: Goal: Will not experience complications related to bowel motility Outcome: Progressing Goal: Will not experience complications related to urinary retention Outcome: Progressing   Problem: Pain Managment: Goal: General experience of comfort will improve and/or be controlled Outcome: Progressing   Problem: Safety: Goal: Ability to remain free from injury will improve Outcome: Progressing   Problem: Skin Integrity: Goal: Risk for impaired skin integrity will decrease Outcome: Progressing   Problem: Activity: Goal: Ability to tolerate increased activity will improve Outcome: Progressing   Problem: Respiratory: Goal: Ability to maintain a clear airway and adequate ventilation will improve Outcome: Progressing   Problem: Role Relationship: Goal: Method of communication will improve Outcome: Progressing

## 2023-06-09 NOTE — Plan of Care (Signed)
  Problem: Coping: Goal: Ability to adjust to condition or change in health will improve Outcome: Progressing   Problem: Nutritional: Goal: Maintenance of adequate nutrition will improve Outcome: Progressing Goal: Progress toward achieving an optimal weight will improve Outcome: Progressing   Problem: Skin Integrity: Goal: Risk for impaired skin integrity will decrease Outcome: Progressing   Problem: Activity: Goal: Risk for activity intolerance will decrease Outcome: Progressing   Problem: Nutrition: Goal: Adequate nutrition will be maintained Outcome: Progressing   Problem: Coping: Goal: Level of anxiety will decrease Outcome: Progressing

## 2023-06-09 NOTE — Progress Notes (Signed)
 Physical Therapy Treatment Patient Details Name: Ralph [name] MRN: [id number] DOB: 02-06-1951 Today's Date: 06/09/2023   History of Present Illness 73 Y/O male with respiratory arrest admitted 2/3, developed etoh withdrawal, DTs. Intubated 2/6-2/7.  PMHx: hyperlipidemia, GERD, asthma, alcohol use.    PT Comments  Pt progressing well towards all goals. Pt remains to require use of RW for safe ambulation at this time. Session focused on LE strengthening and standing balance as pt remains a high fall risk. Aware AIR was denied by insurance. Plan is now to return home with spouse and [facility] PT and use of RW. Acute PT to con't to follow.    If plan is discharge home, recommend the following: Assistance with cooking/housework;Direct supervision/assist for medications management;Direct supervision/assist for financial management;Assist for transportation;Supervision due to cognitive status;A little help with walking and/or transfers;A little help with bathing/dressing/bathroom;Help with stairs or ramp for entrance   Can travel by private vehicle        Equipment Recommendations  Rolling walker (2 wheels)    Recommendations for Other Services       Precautions / Restrictions Precautions Precautions: Fall Restrictions Weight Bearing Restrictions Per Provider Order: No     Mobility  Bed Mobility Overal bed mobility: Needs Assistance Bed Mobility: Sit to Supine, Supine to Sit     Supine to sit: Supervision Sit to supine: Supervision   General bed mobility comments: HOB elevated, pt able to bring self to EOB without physical assist, no use of bed rails    Transfers Overall transfer level: Needs assistance Equipment used: Rolling walker (2 wheels) Transfers: Sit to/from Stand Sit to Stand: Min assist           General transfer comment: repeated verbal cues to push up from bed not pull up on walker, minA to power up and stedy during transition of hands from bed to RW     Ambulation/Gait Ambulation/Gait assistance: Min assist Gait Distance (Feet): 200 Feet Assistive device: Rolling walker (2 wheels), 1 person hand held assist, None Gait Pattern/deviations: Wide base of support, Step-through pattern, Decreased stride length Gait velocity: dec Gait velocity interpretation: <1.31 ft/sec, indicative of household ambulator   General Gait Details: worked on ambulation without AD in which pt was unsteady ultimately requiring unilateral HHA. pt with shorter step height and length and requiring minA compared to when amb with RW.  worked on stepping over objects, pt with LOB x3 out of 6 times requiring minA to [employer]     Tilt Bed    Modified Rankin (Stroke Patients Only)       Balance Overall balance assessment: Needs assistance Sitting-balance support: Feet supported, Bilateral upper extremity supported Sitting balance-Leahy Scale: Good Sitting balance - Comments: able to maintain midline position Postural control: Other (comment) Standing balance support: Reliant on assistive device for balance, During functional activity, Bilateral upper extremity supported Standing balance-Leahy Scale: Fair Standing balance comment: braces back of knees on bed                            Communication Communication Communication: No apparent difficulties  Cognition Arousal: Alert Behavior During Therapy: WFL for tasks assessed/performed   PT - Cognitive impairments: No family/caregiver present to determine baseline, Safety/Judgement, Problem solving, Attention, Sequencing  PT - Cognition Comments: pt continued with delayed processing but overall much improved from beginning of stay Following commands: Intact Following commands impaired: Follows one step commands with increased time    Cueing Cueing Techniques: Verbal cues, Gestural cues, Tactile cues,  Visual cues  Exercises Other Exercises Other Exercises: worked on sit to stands with bilat UE held out at 90 deg shld flex concentrating on not letting back of knees hit the bed. initially heavy minA to maintain balance progressing to light minA, noted fatigue Other Exercises: marching in place x 10 reps, holding knee up for count of 3. Pt requiring modA to maintain balance on SLS on L LE compared to minA when on R LE for SLS    General Comments General comments (skin integrity, edema, etc.): VSS      Pertinent Vitals/Pain Pain Assessment Pain Assessment: No/denies pain    Home Living                          Prior Function            PT Goals (current goals can now be found in the care plan section) Acute Rehab PT Goals Patient Stated Goal: home PT Goal Formulation: Patient unable to participate in goal setting Time For Goal Achievement: 06/12/23 Potential to Achieve Goals: Good Progress towards PT goals: Progressing toward goals    Frequency    Min 1X/week      PT Plan      Co-evaluation              AM-PAC PT "6 Clicks" Mobility   Outcome Measure  Help needed turning from your back to your side while in a flat bed without using bedrails?: A Little Help needed moving from lying on your back to sitting on the side of a flat bed without using bedrails?: A Little Help needed moving to and from a bed to a chair (including a wheelchair)?: A Little Help needed standing up from a chair using your arms (e.g., wheelchair or bedside chair)?: A Little Help needed to walk in hospital room?: A Little Help needed climbing 3-5 steps with a railing? : A Lot 6 Click Score: 17    End of Session Equipment Utilized During Treatment: Gait belt Activity Tolerance: Patient tolerated treatment well Patient left: in bed;with call bell/phone within reach Nurse Communication: Mobility status PT Visit Diagnosis: Unsteadiness on feet (R26.81);Difficulty in walking, not  elsewhere classified (R26.2);Other symptoms and signs involving the nervous system (R29.898)     Time: 5366-4403 PT Time Calculation (min) (ACUTE ONLY): 21 min  Charges:    $Gait Training: 8-22 mins PT General Charges $$ ACUTE PT VISIT: 1 Visit                     Lewis Shock, PT, DPT Acute Rehabilitation Services Secure chat preferred Office #: [phone]    Iona Hansen 06/09/2023, 10:17 AM

## 2023-06-09 NOTE — Telephone Encounter (Signed)
 Pharmacy Patient Advocate Encounter  Received notification from [facility] that Prior Authorization for QUEtiapine Fumarate 100MG  tablets  has been APPROVED from 06/09/2023 to 06/08/2024   PA #/Case ID/Reference #: [id number]

## 2023-06-09 NOTE — Progress Notes (Signed)
 Discharge instructions given to patient by SWAT nurse. PICC line removed by IV team prior to discharge. Patient transported to the discharge lounge.

## 2023-06-09 NOTE — Telephone Encounter (Signed)
 Pharmacy Patient Advocate Encounter   Received notification from Fax that prior authorization for QUEtiapine Fumarate 100MG  tablets is required/requested.   Insurance verification completed.   The patient is insured through [facility] .   Per test claim: PA required; PA submitted to above mentioned insurance via CoverMyMeds Key/confirmation #/EOC [id number] Status is pending

## 2023-06-10 DIAGNOSIS — G4733 Obstructive sleep apnea (adult) (pediatric): Secondary | ICD-10-CM | POA: Diagnosis not present

## 2023-06-13 DIAGNOSIS — J9601 Acute respiratory failure with hypoxia: Secondary | ICD-10-CM | POA: Diagnosis not present

## 2023-06-13 DIAGNOSIS — M4802 Spinal stenosis, cervical region: Secondary | ICD-10-CM | POA: Diagnosis not present

## 2023-06-13 DIAGNOSIS — M47812 Spondylosis without myelopathy or radiculopathy, cervical region: Secondary | ICD-10-CM | POA: Diagnosis not present

## 2023-06-13 DIAGNOSIS — M7731 Calcaneal spur, right foot: Secondary | ICD-10-CM | POA: Diagnosis not present

## 2023-06-13 DIAGNOSIS — Z87442 Personal history of urinary calculi: Secondary | ICD-10-CM | POA: Diagnosis not present

## 2023-06-13 DIAGNOSIS — M5090 Cervical disc disorder, unspecified, unspecified cervical region: Secondary | ICD-10-CM | POA: Diagnosis not present

## 2023-06-13 DIAGNOSIS — E440 Moderate protein-calorie malnutrition: Secondary | ICD-10-CM | POA: Diagnosis not present

## 2023-06-13 DIAGNOSIS — Z7951 Long term (current) use of inhaled steroids: Secondary | ICD-10-CM | POA: Diagnosis not present

## 2023-06-13 DIAGNOSIS — M1990 Unspecified osteoarthritis, unspecified site: Secondary | ICD-10-CM | POA: Diagnosis not present

## 2023-06-13 DIAGNOSIS — Z8781 Personal history of (healed) traumatic fracture: Secondary | ICD-10-CM | POA: Diagnosis not present

## 2023-06-13 DIAGNOSIS — Z9181 History of falling: Secondary | ICD-10-CM | POA: Diagnosis not present

## 2023-06-13 DIAGNOSIS — E785 Hyperlipidemia, unspecified: Secondary | ICD-10-CM | POA: Diagnosis not present

## 2023-06-13 DIAGNOSIS — Z87891 Personal history of nicotine dependence: Secondary | ICD-10-CM | POA: Diagnosis not present

## 2023-06-13 DIAGNOSIS — J9811 Atelectasis: Secondary | ICD-10-CM | POA: Diagnosis not present

## 2023-06-13 DIAGNOSIS — K219 Gastro-esophageal reflux disease without esophagitis: Secondary | ICD-10-CM | POA: Diagnosis not present

## 2023-06-13 DIAGNOSIS — J45909 Unspecified asthma, uncomplicated: Secondary | ICD-10-CM | POA: Diagnosis not present

## 2023-06-13 DIAGNOSIS — I8390 Asymptomatic varicose veins of unspecified lower extremity: Secondary | ICD-10-CM | POA: Diagnosis not present

## 2023-06-13 DIAGNOSIS — M7732 Calcaneal spur, left foot: Secondary | ICD-10-CM | POA: Diagnosis not present

## 2023-06-13 DIAGNOSIS — N289 Disorder of kidney and ureter, unspecified: Secondary | ICD-10-CM | POA: Diagnosis not present

## 2023-06-15 DIAGNOSIS — G894 Chronic pain syndrome: Secondary | ICD-10-CM | POA: Diagnosis not present

## 2023-06-15 DIAGNOSIS — F1021 Alcohol dependence, in remission: Secondary | ICD-10-CM | POA: Diagnosis not present

## 2023-06-15 DIAGNOSIS — J45909 Unspecified asthma, uncomplicated: Secondary | ICD-10-CM | POA: Diagnosis not present

## 2023-06-15 DIAGNOSIS — Z23 Encounter for immunization: Secondary | ICD-10-CM | POA: Diagnosis not present

## 2023-06-16 DIAGNOSIS — Z8781 Personal history of (healed) traumatic fracture: Secondary | ICD-10-CM | POA: Diagnosis not present

## 2023-06-16 DIAGNOSIS — M47812 Spondylosis without myelopathy or radiculopathy, cervical region: Secondary | ICD-10-CM | POA: Diagnosis not present

## 2023-06-16 DIAGNOSIS — I8390 Asymptomatic varicose veins of unspecified lower extremity: Secondary | ICD-10-CM | POA: Diagnosis not present

## 2023-06-16 DIAGNOSIS — M7732 Calcaneal spur, left foot: Secondary | ICD-10-CM | POA: Diagnosis not present

## 2023-06-16 DIAGNOSIS — J45909 Unspecified asthma, uncomplicated: Secondary | ICD-10-CM | POA: Diagnosis not present

## 2023-06-16 DIAGNOSIS — M7731 Calcaneal spur, right foot: Secondary | ICD-10-CM | POA: Diagnosis not present

## 2023-06-16 DIAGNOSIS — E440 Moderate protein-calorie malnutrition: Secondary | ICD-10-CM | POA: Diagnosis not present

## 2023-06-16 DIAGNOSIS — M1990 Unspecified osteoarthritis, unspecified site: Secondary | ICD-10-CM | POA: Diagnosis not present

## 2023-06-16 DIAGNOSIS — E785 Hyperlipidemia, unspecified: Secondary | ICD-10-CM | POA: Diagnosis not present

## 2023-06-16 DIAGNOSIS — Z87891 Personal history of nicotine dependence: Secondary | ICD-10-CM | POA: Diagnosis not present

## 2023-06-16 DIAGNOSIS — K219 Gastro-esophageal reflux disease without esophagitis: Secondary | ICD-10-CM | POA: Diagnosis not present

## 2023-06-16 DIAGNOSIS — N289 Disorder of kidney and ureter, unspecified: Secondary | ICD-10-CM | POA: Diagnosis not present

## 2023-06-16 DIAGNOSIS — M5090 Cervical disc disorder, unspecified, unspecified cervical region: Secondary | ICD-10-CM | POA: Diagnosis not present

## 2023-06-16 DIAGNOSIS — J9601 Acute respiratory failure with hypoxia: Secondary | ICD-10-CM | POA: Diagnosis not present

## 2023-06-16 DIAGNOSIS — M4802 Spinal stenosis, cervical region: Secondary | ICD-10-CM | POA: Diagnosis not present

## 2023-06-16 DIAGNOSIS — J9811 Atelectasis: Secondary | ICD-10-CM | POA: Diagnosis not present

## 2023-06-17 DIAGNOSIS — Z87891 Personal history of nicotine dependence: Secondary | ICD-10-CM | POA: Diagnosis not present

## 2023-06-17 DIAGNOSIS — Z8781 Personal history of (healed) traumatic fracture: Secondary | ICD-10-CM | POA: Diagnosis not present

## 2023-06-17 DIAGNOSIS — M47812 Spondylosis without myelopathy or radiculopathy, cervical region: Secondary | ICD-10-CM | POA: Diagnosis not present

## 2023-06-17 DIAGNOSIS — I8390 Asymptomatic varicose veins of unspecified lower extremity: Secondary | ICD-10-CM | POA: Diagnosis not present

## 2023-06-17 DIAGNOSIS — J9811 Atelectasis: Secondary | ICD-10-CM | POA: Diagnosis not present

## 2023-06-17 DIAGNOSIS — M5090 Cervical disc disorder, unspecified, unspecified cervical region: Secondary | ICD-10-CM | POA: Diagnosis not present

## 2023-06-17 DIAGNOSIS — N289 Disorder of kidney and ureter, unspecified: Secondary | ICD-10-CM | POA: Diagnosis not present

## 2023-06-17 DIAGNOSIS — J9601 Acute respiratory failure with hypoxia: Secondary | ICD-10-CM | POA: Diagnosis not present

## 2023-06-17 DIAGNOSIS — E440 Moderate protein-calorie malnutrition: Secondary | ICD-10-CM | POA: Diagnosis not present

## 2023-06-17 DIAGNOSIS — M7731 Calcaneal spur, right foot: Secondary | ICD-10-CM | POA: Diagnosis not present

## 2023-06-17 DIAGNOSIS — E785 Hyperlipidemia, unspecified: Secondary | ICD-10-CM | POA: Diagnosis not present

## 2023-06-17 DIAGNOSIS — M1990 Unspecified osteoarthritis, unspecified site: Secondary | ICD-10-CM | POA: Diagnosis not present

## 2023-06-17 DIAGNOSIS — M4802 Spinal stenosis, cervical region: Secondary | ICD-10-CM | POA: Diagnosis not present

## 2023-06-17 DIAGNOSIS — J45909 Unspecified asthma, uncomplicated: Secondary | ICD-10-CM | POA: Diagnosis not present

## 2023-06-17 DIAGNOSIS — K219 Gastro-esophageal reflux disease without esophagitis: Secondary | ICD-10-CM | POA: Diagnosis not present

## 2023-06-17 DIAGNOSIS — M7732 Calcaneal spur, left foot: Secondary | ICD-10-CM | POA: Diagnosis not present

## 2023-06-18 DIAGNOSIS — K219 Gastro-esophageal reflux disease without esophagitis: Secondary | ICD-10-CM | POA: Diagnosis not present

## 2023-06-18 DIAGNOSIS — M5090 Cervical disc disorder, unspecified, unspecified cervical region: Secondary | ICD-10-CM | POA: Diagnosis not present

## 2023-06-18 DIAGNOSIS — Z87891 Personal history of nicotine dependence: Secondary | ICD-10-CM | POA: Diagnosis not present

## 2023-06-18 DIAGNOSIS — M7731 Calcaneal spur, right foot: Secondary | ICD-10-CM | POA: Diagnosis not present

## 2023-06-18 DIAGNOSIS — J9601 Acute respiratory failure with hypoxia: Secondary | ICD-10-CM | POA: Diagnosis not present

## 2023-06-18 DIAGNOSIS — J9811 Atelectasis: Secondary | ICD-10-CM | POA: Diagnosis not present

## 2023-06-18 DIAGNOSIS — E440 Moderate protein-calorie malnutrition: Secondary | ICD-10-CM | POA: Diagnosis not present

## 2023-06-18 DIAGNOSIS — M1990 Unspecified osteoarthritis, unspecified site: Secondary | ICD-10-CM | POA: Diagnosis not present

## 2023-06-18 DIAGNOSIS — N289 Disorder of kidney and ureter, unspecified: Secondary | ICD-10-CM | POA: Diagnosis not present

## 2023-06-18 DIAGNOSIS — M7732 Calcaneal spur, left foot: Secondary | ICD-10-CM | POA: Diagnosis not present

## 2023-06-18 DIAGNOSIS — J45909 Unspecified asthma, uncomplicated: Secondary | ICD-10-CM | POA: Diagnosis not present

## 2023-06-18 DIAGNOSIS — Z8781 Personal history of (healed) traumatic fracture: Secondary | ICD-10-CM | POA: Diagnosis not present

## 2023-06-18 DIAGNOSIS — I8390 Asymptomatic varicose veins of unspecified lower extremity: Secondary | ICD-10-CM | POA: Diagnosis not present

## 2023-06-18 DIAGNOSIS — M47812 Spondylosis without myelopathy or radiculopathy, cervical region: Secondary | ICD-10-CM | POA: Diagnosis not present

## 2023-06-18 DIAGNOSIS — E785 Hyperlipidemia, unspecified: Secondary | ICD-10-CM | POA: Diagnosis not present

## 2023-06-18 DIAGNOSIS — M4802 Spinal stenosis, cervical region: Secondary | ICD-10-CM | POA: Diagnosis not present

## 2023-06-23 DIAGNOSIS — J45909 Unspecified asthma, uncomplicated: Secondary | ICD-10-CM | POA: Diagnosis not present

## 2023-06-23 DIAGNOSIS — M7732 Calcaneal spur, left foot: Secondary | ICD-10-CM | POA: Diagnosis not present

## 2023-06-23 DIAGNOSIS — N289 Disorder of kidney and ureter, unspecified: Secondary | ICD-10-CM | POA: Diagnosis not present

## 2023-06-23 DIAGNOSIS — M5090 Cervical disc disorder, unspecified, unspecified cervical region: Secondary | ICD-10-CM | POA: Diagnosis not present

## 2023-06-23 DIAGNOSIS — I8390 Asymptomatic varicose veins of unspecified lower extremity: Secondary | ICD-10-CM | POA: Diagnosis not present

## 2023-06-23 DIAGNOSIS — K219 Gastro-esophageal reflux disease without esophagitis: Secondary | ICD-10-CM | POA: Diagnosis not present

## 2023-06-23 DIAGNOSIS — M1990 Unspecified osteoarthritis, unspecified site: Secondary | ICD-10-CM | POA: Diagnosis not present

## 2023-06-23 DIAGNOSIS — M4802 Spinal stenosis, cervical region: Secondary | ICD-10-CM | POA: Diagnosis not present

## 2023-06-23 DIAGNOSIS — E785 Hyperlipidemia, unspecified: Secondary | ICD-10-CM | POA: Diagnosis not present

## 2023-06-23 DIAGNOSIS — Z8781 Personal history of (healed) traumatic fracture: Secondary | ICD-10-CM | POA: Diagnosis not present

## 2023-06-23 DIAGNOSIS — Z87891 Personal history of nicotine dependence: Secondary | ICD-10-CM | POA: Diagnosis not present

## 2023-06-23 DIAGNOSIS — M7731 Calcaneal spur, right foot: Secondary | ICD-10-CM | POA: Diagnosis not present

## 2023-06-23 DIAGNOSIS — E440 Moderate protein-calorie malnutrition: Secondary | ICD-10-CM | POA: Diagnosis not present

## 2023-06-23 DIAGNOSIS — J9811 Atelectasis: Secondary | ICD-10-CM | POA: Diagnosis not present

## 2023-06-23 DIAGNOSIS — M47812 Spondylosis without myelopathy or radiculopathy, cervical region: Secondary | ICD-10-CM | POA: Diagnosis not present

## 2023-06-23 DIAGNOSIS — J9601 Acute respiratory failure with hypoxia: Secondary | ICD-10-CM | POA: Diagnosis not present

## 2023-06-25 DIAGNOSIS — J9601 Acute respiratory failure with hypoxia: Secondary | ICD-10-CM | POA: Diagnosis not present

## 2023-06-25 DIAGNOSIS — M7731 Calcaneal spur, right foot: Secondary | ICD-10-CM | POA: Diagnosis not present

## 2023-06-25 DIAGNOSIS — J9811 Atelectasis: Secondary | ICD-10-CM | POA: Diagnosis not present

## 2023-06-25 DIAGNOSIS — M4802 Spinal stenosis, cervical region: Secondary | ICD-10-CM | POA: Diagnosis not present

## 2023-06-25 DIAGNOSIS — M47812 Spondylosis without myelopathy or radiculopathy, cervical region: Secondary | ICD-10-CM | POA: Diagnosis not present

## 2023-06-25 DIAGNOSIS — J45909 Unspecified asthma, uncomplicated: Secondary | ICD-10-CM | POA: Diagnosis not present

## 2023-06-25 DIAGNOSIS — Z8781 Personal history of (healed) traumatic fracture: Secondary | ICD-10-CM | POA: Diagnosis not present

## 2023-06-25 DIAGNOSIS — M7732 Calcaneal spur, left foot: Secondary | ICD-10-CM | POA: Diagnosis not present

## 2023-06-25 DIAGNOSIS — N289 Disorder of kidney and ureter, unspecified: Secondary | ICD-10-CM | POA: Diagnosis not present

## 2023-06-25 DIAGNOSIS — M1990 Unspecified osteoarthritis, unspecified site: Secondary | ICD-10-CM | POA: Diagnosis not present

## 2023-06-25 DIAGNOSIS — I8390 Asymptomatic varicose veins of unspecified lower extremity: Secondary | ICD-10-CM | POA: Diagnosis not present

## 2023-06-25 DIAGNOSIS — E785 Hyperlipidemia, unspecified: Secondary | ICD-10-CM | POA: Diagnosis not present

## 2023-06-25 DIAGNOSIS — E440 Moderate protein-calorie malnutrition: Secondary | ICD-10-CM | POA: Diagnosis not present

## 2023-06-25 DIAGNOSIS — Z87891 Personal history of nicotine dependence: Secondary | ICD-10-CM | POA: Diagnosis not present

## 2023-06-25 DIAGNOSIS — K219 Gastro-esophageal reflux disease without esophagitis: Secondary | ICD-10-CM | POA: Diagnosis not present

## 2023-06-25 DIAGNOSIS — M5090 Cervical disc disorder, unspecified, unspecified cervical region: Secondary | ICD-10-CM | POA: Diagnosis not present

## 2023-07-01 DIAGNOSIS — J9601 Acute respiratory failure with hypoxia: Secondary | ICD-10-CM | POA: Diagnosis not present

## 2023-07-01 DIAGNOSIS — I8390 Asymptomatic varicose veins of unspecified lower extremity: Secondary | ICD-10-CM | POA: Diagnosis not present

## 2023-07-01 DIAGNOSIS — J45909 Unspecified asthma, uncomplicated: Secondary | ICD-10-CM | POA: Diagnosis not present

## 2023-07-01 DIAGNOSIS — Z8781 Personal history of (healed) traumatic fracture: Secondary | ICD-10-CM | POA: Diagnosis not present

## 2023-07-01 DIAGNOSIS — K219 Gastro-esophageal reflux disease without esophagitis: Secondary | ICD-10-CM | POA: Diagnosis not present

## 2023-07-01 DIAGNOSIS — M1990 Unspecified osteoarthritis, unspecified site: Secondary | ICD-10-CM | POA: Diagnosis not present

## 2023-07-01 DIAGNOSIS — E785 Hyperlipidemia, unspecified: Secondary | ICD-10-CM | POA: Diagnosis not present

## 2023-07-01 DIAGNOSIS — E440 Moderate protein-calorie malnutrition: Secondary | ICD-10-CM | POA: Diagnosis not present

## 2023-07-01 DIAGNOSIS — N289 Disorder of kidney and ureter, unspecified: Secondary | ICD-10-CM | POA: Diagnosis not present

## 2023-07-01 DIAGNOSIS — J9811 Atelectasis: Secondary | ICD-10-CM | POA: Diagnosis not present

## 2023-07-01 DIAGNOSIS — M7731 Calcaneal spur, right foot: Secondary | ICD-10-CM | POA: Diagnosis not present

## 2023-07-01 DIAGNOSIS — M47812 Spondylosis without myelopathy or radiculopathy, cervical region: Secondary | ICD-10-CM | POA: Diagnosis not present

## 2023-07-01 DIAGNOSIS — M4802 Spinal stenosis, cervical region: Secondary | ICD-10-CM | POA: Diagnosis not present

## 2023-07-01 DIAGNOSIS — M7732 Calcaneal spur, left foot: Secondary | ICD-10-CM | POA: Diagnosis not present

## 2023-07-01 DIAGNOSIS — Z87891 Personal history of nicotine dependence: Secondary | ICD-10-CM | POA: Diagnosis not present

## 2023-07-01 DIAGNOSIS — M5090 Cervical disc disorder, unspecified, unspecified cervical region: Secondary | ICD-10-CM | POA: Diagnosis not present

## 2023-07-08 DIAGNOSIS — M47812 Spondylosis without myelopathy or radiculopathy, cervical region: Secondary | ICD-10-CM | POA: Diagnosis not present

## 2023-07-08 DIAGNOSIS — K219 Gastro-esophageal reflux disease without esophagitis: Secondary | ICD-10-CM | POA: Diagnosis not present

## 2023-07-08 DIAGNOSIS — Z8781 Personal history of (healed) traumatic fracture: Secondary | ICD-10-CM | POA: Diagnosis not present

## 2023-07-08 DIAGNOSIS — M7731 Calcaneal spur, right foot: Secondary | ICD-10-CM | POA: Diagnosis not present

## 2023-07-08 DIAGNOSIS — J9601 Acute respiratory failure with hypoxia: Secondary | ICD-10-CM | POA: Diagnosis not present

## 2023-07-08 DIAGNOSIS — E440 Moderate protein-calorie malnutrition: Secondary | ICD-10-CM | POA: Diagnosis not present

## 2023-07-08 DIAGNOSIS — N289 Disorder of kidney and ureter, unspecified: Secondary | ICD-10-CM | POA: Diagnosis not present

## 2023-07-08 DIAGNOSIS — J45909 Unspecified asthma, uncomplicated: Secondary | ICD-10-CM | POA: Diagnosis not present

## 2023-07-08 DIAGNOSIS — Z87891 Personal history of nicotine dependence: Secondary | ICD-10-CM | POA: Diagnosis not present

## 2023-07-08 DIAGNOSIS — E785 Hyperlipidemia, unspecified: Secondary | ICD-10-CM | POA: Diagnosis not present

## 2023-07-08 DIAGNOSIS — M1990 Unspecified osteoarthritis, unspecified site: Secondary | ICD-10-CM | POA: Diagnosis not present

## 2023-07-08 DIAGNOSIS — M4802 Spinal stenosis, cervical region: Secondary | ICD-10-CM | POA: Diagnosis not present

## 2023-07-08 DIAGNOSIS — M7732 Calcaneal spur, left foot: Secondary | ICD-10-CM | POA: Diagnosis not present

## 2023-07-08 DIAGNOSIS — M5090 Cervical disc disorder, unspecified, unspecified cervical region: Secondary | ICD-10-CM | POA: Diagnosis not present

## 2023-07-08 DIAGNOSIS — J9811 Atelectasis: Secondary | ICD-10-CM | POA: Diagnosis not present

## 2023-07-08 DIAGNOSIS — I8390 Asymptomatic varicose veins of unspecified lower extremity: Secondary | ICD-10-CM | POA: Diagnosis not present

## 2023-07-20 DIAGNOSIS — R454 Irritability and anger: Secondary | ICD-10-CM | POA: Diagnosis not present

## 2023-07-20 DIAGNOSIS — D7589 Other specified diseases of blood and blood-forming organs: Secondary | ICD-10-CM | POA: Diagnosis not present

## 2023-07-20 DIAGNOSIS — Z1331 Encounter for screening for depression: Secondary | ICD-10-CM | POA: Diagnosis not present

## 2023-07-20 DIAGNOSIS — Z6827 Body mass index (BMI) 27.0-27.9, adult: Secondary | ICD-10-CM | POA: Diagnosis not present

## 2023-07-20 DIAGNOSIS — F418 Other specified anxiety disorders: Secondary | ICD-10-CM | POA: Diagnosis not present

## 2023-07-22 DIAGNOSIS — H5213 Myopia, bilateral: Secondary | ICD-10-CM | POA: Diagnosis not present

## 2023-07-30 ENCOUNTER — Emergency Department (HOSPITAL_COMMUNITY)

## 2023-07-30 ENCOUNTER — Other Ambulatory Visit: Payer: Self-pay

## 2023-07-30 ENCOUNTER — Emergency Department (HOSPITAL_COMMUNITY)
Admission: EM | Admit: 2023-07-30 | Discharge: 2023-07-30 | Disposition: A | Discharge status: Home / Self Care | Attending: Emergency Medicine | Admitting: Emergency Medicine

## 2023-07-30 ENCOUNTER — Encounter (HOSPITAL_COMMUNITY): Payer: Self-pay | Admitting: Emergency Medicine

## 2023-07-30 DIAGNOSIS — R0902 Hypoxemia: Secondary | ICD-10-CM | POA: Diagnosis not present

## 2023-07-30 DIAGNOSIS — I499 Cardiac arrhythmia, unspecified: Secondary | ICD-10-CM | POA: Diagnosis not present

## 2023-07-30 DIAGNOSIS — R000 Tachycardia, unspecified: Secondary | ICD-10-CM | POA: Diagnosis not present

## 2023-07-30 DIAGNOSIS — R0602 Shortness of breath: Secondary | ICD-10-CM | POA: Diagnosis not present

## 2023-07-30 DIAGNOSIS — Z91040 Latex allergy status: Secondary | ICD-10-CM | POA: Diagnosis not present

## 2023-07-30 DIAGNOSIS — J45909 Unspecified asthma, uncomplicated: Secondary | ICD-10-CM | POA: Insufficient documentation

## 2023-07-30 DIAGNOSIS — R42 Dizziness and giddiness: Secondary | ICD-10-CM | POA: Diagnosis not present

## 2023-07-30 LAB — COMPREHENSIVE METABOLIC PANEL WITH GFR
ALT: 17 U/L (ref 0–44)
AST: 23 U/L (ref 15–41)
Albumin: 3.6 g/dL (ref 3.5–5.0)
Alkaline Phosphatase: 40 U/L (ref 38–126)
Anion gap: 11 (ref 5–15)
BUN: 15 mg/dL (ref 8–23)
CO2: 23 mmol/L (ref 22–32)
Calcium: 8.8 mg/dL — ABNORMAL LOW (ref 8.9–10.3)
Chloride: 106 mmol/L (ref 98–111)
Creatinine, Ser: 1.18 mg/dL (ref 0.61–1.24)
GFR, Estimated: >60 mL/min (ref >60)
Glucose, Bld: 116 mg/dL — ABNORMAL HIGH (ref 70–99)
Potassium: 3.6 mmol/L (ref 3.5–5.1)
Sodium: 140 mmol/L (ref 135–145)
Total Bilirubin: 0.6 mg/dL (ref 0.0–1.2)
Total Protein: 6.1 g/dL — ABNORMAL LOW (ref 6.5–8.1)

## 2023-07-30 LAB — RESP PANEL BY RT-PCR (RSV, FLU A&B, COVID)  RVPGX2
Influenza A by PCR: NEGATIVE
Influenza B by PCR: NEGATIVE
Resp Syncytial Virus by PCR: NEGATIVE
SARS Coronavirus 2 by RT PCR: NEGATIVE

## 2023-07-30 LAB — D-DIMER, QUANTITATIVE: D-Dimer, Quant: 0.49 ug{FEU}/mL (ref 0.00–0.50)

## 2023-07-30 LAB — CBC
HCT: 42 % (ref 39.0–52.0)
Hemoglobin: 14.2 g/dL (ref 13.0–17.0)
MCH: 34.5 pg — ABNORMAL HIGH (ref 26.0–34.0)
MCHC: 33.8 g/dL (ref 30.0–36.0)
MCV: 102.2 fL — ABNORMAL HIGH (ref 80.0–100.0)
Platelets: 154 10*3/uL (ref 150–400)
RBC: 4.11 MIL/uL — ABNORMAL LOW (ref 4.22–5.81)
RDW: 12.1 % (ref 11.5–15.5)
WBC: 6.1 10*3/uL (ref 4.0–10.5)
nRBC: 0 % (ref 0.0–0.2)

## 2023-07-30 LAB — TROPONIN I (HIGH SENSITIVITY)
Troponin I (High Sensitivity): 6 ng/L (ref <18)
Troponin I (High Sensitivity): 17 ng/L (ref <18)

## 2023-07-30 MED ORDER — LACTATED RINGERS IV BOLUS
1000.0000 mL | Freq: Once | INTRAVENOUS | Status: AC
  Administered 2023-07-30: 1000 mL via INTRAVENOUS

## 2023-07-30 NOTE — ED Triage Notes (Signed)
 Pt BIB by EMS for onset of SHOB since this morning. Hx of asthma and respiratory failure. Oxygen saturation 90% with EMS, placed on 2 L via [place]. Pt endorses generalized weakness. Denies any CP. Pt alert and oriented on arrival.

## 2023-07-30 NOTE — Discharge Instructions (Signed)
If you develop chest pain, shortness of breath, fever, vomiting, abdominal or back pain, or any other new/concerning symptoms then return to the ER for evaluation.  °

## 2023-07-30 NOTE — ED Notes (Signed)
 Lab called to add Troponin and D-dimer.

## 2023-07-30 NOTE — ED Notes (Signed)
 RN called CCMD for cardiac monitoring.

## 2023-07-30 NOTE — ED Provider Notes (Signed)
  EMERGENCY DEPARTMENT AT [facility] Provider Note   CSN: [id number] Arrival date & time: 07/30/23  2952     History  Chief Complaint  Patient presents with   Shortness of Breath    Ralph [name] is a 73 y.o. male.  HPI 73 year old male presents with shortness of breath.  He states he had a sudden onset of shortness of breath this morning.  He states he has a history of asthma and has been having episodes like this for a while that are progressively worsened.  He will get suddenly short of breath and feel like he is gasping for air.  Normally he can rest and take deep slow breaths and it goes away.  However it did not work this time and his episode lasted overall about 45 minutes.  He was brought in with EMS and his symptoms resolved sometime after arrival though with no specific treatments besides oxygen given by EMS.  He denies fevers, cough, chest pain, leg swelling.  He denies any lightheadedness or weakness though the triage note indicates he was feeling generally weak.  Right now he states he feels at his baseline.  Home Medications Prior to Admission medications   Medication Sig Start Date End Date Taking? Authorizing Provider  Budeson-Glycopyrrol-Formoterol (BREZTRI AEROSPHERE) 160-9-4.8 MCG/ACT AERO Inhale 1 puff into the lungs daily.   Yes [provider]  celecoxib (CELEBREX) 100 MG capsule Take 100 mg by mouth daily as needed for mild pain (pain score 1-3) or moderate pain (pain score 4-6). 06/16/23  Yes [provider]  escitalopram (LEXAPRO) 20 MG tablet Take 20 mg by mouth daily.   Yes [provider]  hydrOXYzine (ATARAX) 25 MG tablet Take 25 mg by mouth daily as needed for anxiety. 07/20/23  Yes [provider]  omeprazole (PRILOSEC) 20 MG capsule Take 20 mg by mouth daily.   Yes [provider]  QUEtiapine (SEROQUEL) 100 MG tablet Take 1 tablet (100 mg total) by mouth at bedtime. 06/08/23 07/30/23 Yes  Willeen Niece, MD  tamsulosin (FLOMAX) 0.4 MG CAPS capsule Take 0.4 mg by mouth at bedtime.   Yes [provider]  ZETIA 10 MG tablet Take 10 mg by mouth daily. 06/15/23  Yes [provider]      Allergies    Statins, Latex, and Morphine and codeine    Review of Systems   Review of Systems  Constitutional:  Negative for fever.  Respiratory:  Positive for shortness of breath. Negative for cough.   Cardiovascular:  Negative for chest pain and leg swelling.  Neurological:  Negative for weakness and light-headedness.    Physical Exam Updated Vital Signs BP 118/72   Pulse 74   Temp 98.6 F (37 C)   Resp 17   Ht 6' (1.829 m)   Wt 88 kg   SpO2 95%   BMI 26.31 kg/m  Physical Exam Vitals and nursing note reviewed.  Constitutional:      General: He is not in acute distress.    Appearance: He is well-developed. He is not ill-appearing or diaphoretic.  HENT:     Head: Normocephalic and atraumatic.  Cardiovascular:     Rate and Rhythm: Normal rate and regular rhythm.     Heart sounds: Normal heart sounds.  Pulmonary:     Effort: Pulmonary effort is normal. No tachypnea or accessory muscle usage.     Breath sounds: Normal breath sounds. No stridor. No wheezing.  Abdominal:  Palpations: Abdomen is soft.     Tenderness: There is no abdominal tenderness.  Musculoskeletal:     Right lower leg: No edema.     Left lower leg: No edema.  Skin:    General: Skin is warm and dry.  Neurological:     Mental Status: He is alert.     ED Results / Procedures / Treatments   Labs (all labs ordered are listed, but only abnormal results are displayed) Labs Reviewed  COMPREHENSIVE METABOLIC PANEL WITH GFR - Abnormal; Notable for the following components:      Result Value   Glucose, Bld 116 (*)    Calcium 8.8 (*)    Total Protein 6.1 (*)    All other components within normal limits  CBC - Abnormal; Notable for the following components:   RBC 4.11 (*)    MCV 102.2  (*)    MCH 34.5 (*)    All other components within normal limits  RESP PANEL BY RT-PCR (RSV, FLU A&B, COVID)  [id number]  D-DIMER, QUANTITATIVE  TROPONIN I (HIGH SENSITIVITY)  TROPONIN I (HIGH SENSITIVITY)    EKG EKG Interpretation Date/Time:  Thursday July 30 2023 07:10:18 EDT Ventricular Rate:  99 PR Interval:  143 QRS Duration:  77 QT Interval:  354 QTC Calculation: 455 R Axis:   19  Text Interpretation: Sinus rhythm Abnormal R-wave progression, early transition Confirmed by Pricilla Loveless [phone]) on 07/30/2023 7:43:34 AM  Radiology DG Chest Port 1 View Result Date: 07/30/2023 CLINICAL DATA:  Shortness of breath EXAM: PORTABLE CHEST 1 VIEW COMPARISON:  06/03/2023 FINDINGS: Normal heart size and stable mediastinal contours including mild convexity at the main pulmonary artery level. There is no edema, consolidation, effusion, or pneumothorax. Artifact from EKG leads. IMPRESSION: No evidence of acute disease. Electronically Signed   By: Tiburcio Pea M.D.   On: 07/30/2023 07:30    Procedures Procedures    Medications Ordered in ED Medications  lactated ringers bolus 1,000 mL (0 mLs Intravenous Stopped 07/30/23 1001)    ED Course/ Medical Decision Making/ A&P                                 Medical Decision Making Amount and/or Complexity of Data Reviewed Labs: ordered.    Details: Troponins are negative though did increase from 6 to 17 Radiology: ordered and independent interpretation performed.    Details: No pneumonia ECG/medicine tests: ordered and independent interpretation performed.    Details: No ischemia   Unclear cause of the patient's transient dyspnea.  D-dimer is negative.  Troponins are technically negative though did increase a little more than would be expected.  I talked to the patient about this.  He remains asymptomatic at this time.  He never had chest pain.  Discussed we could consider a third troponin though he feels ready for discharge and does  not want to get a third troponin.  Discussed this could be called cardiac issue but he still wants to leave.  I think this is reasonable but discussed and gave return precautions and recommend he follow-up with his primary care physician.        Final Clinical Impression(s) / ED Diagnoses Final diagnoses:  Shortness of breath    Rx / DC Orders ED Discharge Orders     None         Pricilla Loveless, MD 07/30/23 1559

## 2023-08-11 ENCOUNTER — Emergency Department (HOSPITAL_COMMUNITY)

## 2023-08-11 ENCOUNTER — Other Ambulatory Visit: Payer: Self-pay

## 2023-08-11 ENCOUNTER — Encounter (HOSPITAL_COMMUNITY): Payer: Self-pay | Admitting: Emergency Medicine

## 2023-08-11 ENCOUNTER — Observation Stay (HOSPITAL_COMMUNITY)
Admission: EM | Admit: 2023-08-11 | Discharge: 2023-08-12 | Disposition: A | Discharge status: Home / Self Care | Attending: Internal Medicine | Admitting: Internal Medicine

## 2023-08-11 DIAGNOSIS — R4781 Slurred speech: Secondary | ICD-10-CM | POA: Diagnosis not present

## 2023-08-11 DIAGNOSIS — Z79899 Other long term (current) drug therapy: Secondary | ICD-10-CM | POA: Insufficient documentation

## 2023-08-11 DIAGNOSIS — I10 Essential (primary) hypertension: Secondary | ICD-10-CM | POA: Diagnosis not present

## 2023-08-11 DIAGNOSIS — R519 Headache, unspecified: Secondary | ICD-10-CM | POA: Diagnosis not present

## 2023-08-11 DIAGNOSIS — J45909 Unspecified asthma, uncomplicated: Secondary | ICD-10-CM | POA: Insufficient documentation

## 2023-08-11 DIAGNOSIS — R471 Dysarthria and anarthria: Secondary | ICD-10-CM | POA: Diagnosis not present

## 2023-08-11 DIAGNOSIS — R531 Weakness: Principal | ICD-10-CM | POA: Insufficient documentation

## 2023-08-11 DIAGNOSIS — I6782 Cerebral ischemia: Secondary | ICD-10-CM | POA: Diagnosis not present

## 2023-08-11 DIAGNOSIS — G459 Transient cerebral ischemic attack, unspecified: Secondary | ICD-10-CM | POA: Diagnosis present

## 2023-08-11 DIAGNOSIS — F1090 Alcohol use, unspecified, uncomplicated: Secondary | ICD-10-CM | POA: Diagnosis not present

## 2023-08-11 DIAGNOSIS — R410 Disorientation, unspecified: Secondary | ICD-10-CM | POA: Diagnosis not present

## 2023-08-11 DIAGNOSIS — Z87891 Personal history of nicotine dependence: Secondary | ICD-10-CM | POA: Insufficient documentation

## 2023-08-11 DIAGNOSIS — R42 Dizziness and giddiness: Secondary | ICD-10-CM | POA: Diagnosis not present

## 2023-08-11 DIAGNOSIS — Z91040 Latex allergy status: Secondary | ICD-10-CM | POA: Diagnosis not present

## 2023-08-11 DIAGNOSIS — F419 Anxiety disorder, unspecified: Secondary | ICD-10-CM | POA: Diagnosis not present

## 2023-08-11 DIAGNOSIS — R29818 Other symptoms and signs involving the nervous system: Secondary | ICD-10-CM | POA: Diagnosis not present

## 2023-08-11 DIAGNOSIS — G319 Degenerative disease of nervous system, unspecified: Secondary | ICD-10-CM | POA: Diagnosis not present

## 2023-08-11 LAB — CBC
HCT: 43.1 % (ref 39.0–52.0)
Hemoglobin: 14.6 g/dL (ref 13.0–17.0)
MCH: 34.4 pg — ABNORMAL HIGH (ref 26.0–34.0)
MCHC: 33.9 g/dL (ref 30.0–36.0)
MCV: 101.4 fL — ABNORMAL HIGH (ref 80.0–100.0)
Platelets: 222 10*3/uL (ref 150–400)
RBC: 4.25 MIL/uL (ref 4.22–5.81)
RDW: 11.9 % (ref 11.5–15.5)
WBC: 8.6 10*3/uL (ref 4.0–10.5)
nRBC: 0 % (ref 0.0–0.2)

## 2023-08-11 LAB — DIFFERENTIAL
Abs Immature Granulocytes: 0.02 10*3/uL (ref 0.00–0.07)
Basophils Absolute: 0 10*3/uL (ref 0.0–0.1)
Basophils Relative: 1 %
Eosinophils Absolute: 0.5 10*3/uL (ref 0.0–0.5)
Eosinophils Relative: 5 %
Immature Granulocytes: 0 %
Lymphocytes Relative: 18 %
Lymphs Abs: 1.6 10*3/uL (ref 0.7–4.0)
Monocytes Absolute: 0.7 10*3/uL (ref 0.1–1.0)
Monocytes Relative: 8 %
Neutro Abs: 5.9 10*3/uL (ref 1.7–7.7)
Neutrophils Relative %: 68 %

## 2023-08-11 LAB — COMPREHENSIVE METABOLIC PANEL WITH GFR
ALT: 18 U/L (ref 0–44)
AST: 20 U/L (ref 15–41)
Albumin: 3.8 g/dL (ref 3.5–5.0)
Alkaline Phosphatase: 49 U/L (ref 38–126)
Anion gap: 13 (ref 5–15)
BUN: 16 mg/dL (ref 8–23)
CO2: 21 mmol/L — ABNORMAL LOW (ref 22–32)
Calcium: 9.4 mg/dL (ref 8.9–10.3)
Chloride: 104 mmol/L (ref 98–111)
Creatinine, Ser: 1.24 mg/dL (ref 0.61–1.24)
GFR, Estimated: >60 mL/min (ref >60)
Glucose, Bld: 105 mg/dL — ABNORMAL HIGH (ref 70–99)
Potassium: 4.3 mmol/L (ref 3.5–5.1)
Sodium: 138 mmol/L (ref 135–145)
Total Bilirubin: 0.8 mg/dL (ref 0.0–1.2)
Total Protein: 6.2 g/dL — ABNORMAL LOW (ref 6.5–8.1)

## 2023-08-11 LAB — PROTIME-INR
INR: 1 (ref 0.8–1.2)
Prothrombin Time: 13.3 s (ref 11.4–15.2)

## 2023-08-11 LAB — I-STAT CHEM 8, ED
BUN: 17 mg/dL (ref 8–23)
Calcium, Ion: 1.17 mmol/L (ref 1.15–1.40)
Chloride: 106 mmol/L (ref 98–111)
Creatinine, Ser: 1.4 mg/dL — ABNORMAL HIGH (ref 0.61–1.24)
Glucose, Bld: 106 mg/dL — ABNORMAL HIGH (ref 70–99)
HCT: 43 % (ref 39.0–52.0)
Hemoglobin: 14.6 g/dL (ref 13.0–17.0)
Potassium: 4.4 mmol/L (ref 3.5–5.1)
Sodium: 139 mmol/L (ref 135–145)
TCO2: 22 mmol/L (ref 22–32)

## 2023-08-11 LAB — ETHANOL: Alcohol, Ethyl (B): <15 mg/dL (ref <15)

## 2023-08-11 LAB — APTT: aPTT: 28 s (ref 24–36)

## 2023-08-11 MED ORDER — ALBUTEROL SULFATE (2.5 MG/3ML) 0.083% IN NEBU: 2.5000 mg | INHALATION_SOLUTION | RESPIRATORY_TRACT | Status: DC | PRN

## 2023-08-11 MED ORDER — TAMSULOSIN HCL 0.4 MG PO CAPS: 0.4000 mg | ORAL_CAPSULE | Freq: Every day | ORAL | Status: DC

## 2023-08-11 MED ORDER — ASPIRIN 81 MG PO TBEC
81.0000 mg | DELAYED_RELEASE_TABLET | Freq: Every day | ORAL | Status: DC
Start: 2023-08-11 — End: 2023-08-12
  Administered 2023-08-11: 81 mg via ORAL
  Filled 2023-08-11: qty 1

## 2023-08-11 MED ORDER — HYDRALAZINE HCL 20 MG/ML IJ SOLN: 10.0000 mg | Freq: Four times a day (QID) | INTRAMUSCULAR | Status: DC | PRN

## 2023-08-11 MED ORDER — FOLIC ACID 1 MG PO TABS
1.0000 mg | ORAL_TABLET | Freq: Every day | ORAL | Status: DC
  Administered 2023-08-11: 1 mg via ORAL
  Filled 2023-08-11: qty 1

## 2023-08-11 MED ORDER — STROKE: EARLY STAGES OF RECOVERY BOOK: Freq: Once | Status: DC

## 2023-08-11 MED ORDER — PANTOPRAZOLE SODIUM 40 MG PO TBEC
40.0000 mg | DELAYED_RELEASE_TABLET | Freq: Every day | ORAL | Status: DC
Start: 2023-08-12 — End: 2023-08-12

## 2023-08-11 MED ORDER — ENOXAPARIN SODIUM 40 MG/0.4ML IJ SOSY
40.0000 mg | PREFILLED_SYRINGE | INTRAMUSCULAR | Status: DC
  Administered 2023-08-11: 40 mg via SUBCUTANEOUS
  Filled 2023-08-11: qty 0.4

## 2023-08-11 MED ORDER — ESCITALOPRAM OXALATE 10 MG PO TABS: 20.0000 mg | ORAL_TABLET | Freq: Every day | ORAL | Status: DC

## 2023-08-11 MED ORDER — BUDESON-GLYCOPYRROL-FORMOTEROL 160-9-4.8 MCG/ACT IN AERO: 1.0000 | INHALATION_SPRAY | Freq: Every day | RESPIRATORY_TRACT | Status: DC

## 2023-08-11 MED ORDER — THIAMINE MONONITRATE 100 MG PO TABS
100.0000 mg | ORAL_TABLET | Freq: Every day | ORAL | Status: DC
  Administered 2023-08-11: 100 mg via ORAL
  Filled 2023-08-11: qty 1

## 2023-08-11 NOTE — Progress Notes (Signed)
 While getting change of shift report from Paola Bohr, RN on dayshift ,Mr Heide walked out of his room and waived . Paola Bohr told me he was fine, that he is independent and gets up ad lib to the bathroom in the hallway.  After report the tech and I noticed that it had been a few minutes so we went to the bathroom to check on him and he was not there .  Upon looking in the room we noticed there were no belongings. I called Bridgette Campus , Consulting civil engineer and told her. I also had the on call provider paged,as well as, called both phone numbers listed for his significant other. It went straight to voicemail and I left a message asking her to call me.  I did not hear back .  The on call provider is aware . I was told that he passed by the blue zone and told the staff he was "going for a walk." One of the  Rns helping out  on this unit, went and looked around in the lobby and ER waiting room, and outside and didn't see him. I will continue to reach out to his SO in hopes that maybe he has contacted her and is safe.

## 2023-08-11 NOTE — H&P (Signed)
 History and Physical    Patient: Ralph [name] AVW:[id number] DOB: [birth year] DOA: 08/11/2023 DOS: the patient was seen and examined on 08/11/2023 PCP: Perley Bradley, MD  Patient coming from: Home  Chief Complaint:  Chief Complaint  Patient presents with   Aphasia   Headache   Weakness   HPI: Ralph [name] is a 73 y.o. male with medical history significant of hypertension, hyperlipidemia, asthma, presenting with acute episode of altered mental status and slurred speech.  Patient's wife at bedside corroborating additional details.  Per report, last night, patient was acting strangely per the wife.  Seemed to be confused.  This morning at approximately 10:00 patient awoke, seemed unsteady, speech was slurred.  Symptoms resolved shortly after however patient had called EMS for ongoing evaluation.  Patient himself states that he felt like "things were shutting down".  No recent change in medications, no recent illness.  Fever, chills,, chest pain, palpitations, nausea, vomiting, abdominal pain.  Denies any numbness, tingling, weakness in extremities.  No history of symptoms similar to this in the past.  Patient admits to previous alcohol use however this has been cut way back to about 1 beer per day.  Was not drinking overnight or in AM.  Review of Systems: As mentioned in the history of present illness. All other systems reviewed and are negative. Past Medical History:  Diagnosis Date   Arthritis    hands   Asthma    GERD (gastroesophageal reflux disease)    History of kidney stones    x2  -episodes   Hyperlipidemia    Renal disorder    Varicose veins    bilateral surgeries- no problems now   Past Surgical History:  Procedure Laterality Date   COLONOSCOPY WITH PROPOFOL  N/A 03/20/2015   Procedure: COLONOSCOPY WITH PROPOFOL ;  Surgeon: Garrett Kallman, MD;  Location: [facility];  Service: Endoscopy;  Laterality: N/A;   HERNIA REPAIR     inguinal hernia   TONSILLECTOMY      VARICOSE VEIN SURGERY Bilateral    2 years ago   VASECTOMY     Social History:  reports that he quit smoking about 10 years ago. His smoking use included cigarettes. He started smoking about 30 years ago. He has a 10 pack-year smoking history. He has never used smokeless tobacco. He reports current alcohol use. He reports that he does not use drugs.  Allergies  Allergen Reactions   Statins Other (See Comments)    myalgia   Latex Hives and Rash   Morphine And Codeine Hives    Family History  Problem Relation Age of Onset   Dementia Mother    COPD Father     Prior to Admission medications   Medication Sig Start Date End Date Taking? Authorizing Provider  Budeson-Glycopyrrol-Formoterol  (BREZTRI AEROSPHERE) 160-9-4.8 MCG/ACT AERO Inhale 1 puff into the lungs daily.   Yes [provider]  celecoxib (CELEBREX) 100 MG capsule Take 100 mg by mouth daily as needed for mild pain (pain score 1-3) or moderate pain (pain score 4-6). 06/16/23  Yes [provider]  escitalopram  (LEXAPRO ) 20 MG tablet Take 20 mg by mouth daily.   Yes [provider]  hydrOXYzine (ATARAX) 25 MG tablet Take 25 mg by mouth daily as needed for anxiety. 07/20/23  Yes [provider]  ibuprofen  (ADVIL ) 200 MG tablet Take 400-800 mg by mouth every 8 (eight) hours as needed for mild pain (pain score 1-3), headache or cramping.   Yes [provider]  omeprazole (PRILOSEC) 20 MG capsule Take 20 mg by mouth daily.   Yes [provider]  QUEtiapine  (SEROQUEL ) 100 MG tablet Take 1 tablet (100 mg total) by mouth at bedtime. 06/08/23 08/10/24 Yes Magdalene School, MD  tamsulosin  (FLOMAX ) 0.4 MG CAPS capsule Take 0.4 mg by mouth at bedtime.   Yes [provider]  ZETIA 10 MG tablet Take 10 mg by mouth daily. 06/15/23  Yes [provider]    Physical Exam: Vitals:   08/11/23 1357 08/11/23 1357 08/11/23 1640 08/11/23 1730  BP:  112/75  127/89  Pulse: 67 71  75   Resp:  16  18  Temp:   (!) 97.5 F (36.4 C)   TempSrc:   Oral   SpO2: 100% 98%  97%  Weight:      Height:       GENERAL:  Alert, pleasant, no acute distress  HEENT:  EOMI CARDIOVASCULAR:  RRR, no murmurs appreciated RESPIRATORY:  Clear to auscultation, no wheezing, rales, or rhonchi GASTROINTESTINAL:  Soft, nontender, nondistended EXTREMITIES:  No LE edema bilaterally NEURO:  No new focal deficits appreciated, CN II through XII intact, muscle strength 5/5 in all extremities, sensation equal, no ataxia appreciated SKIN:  No rashes noted PSYCH:  Appropriate mood and affect   Data Reviewed:  CT and MRI results reviewed with patient, negative for acute hemorrhage.  Negative for acute ischemic CVA.  Assessment and Plan:  TIA with concern for CVA - Slurred speech with multiple risk factors including hypertension, hyperlipidemia.  Symptoms now have resolved.  Neurology consulted by ED provider.  Will initiate TIA/CVA ischemic workup.  Will order echo.  Continue on telemetry.  Carotid ultrasound.  Recheck fasting lipids and A1c in AM.  Other etiology of patient's slurred speech include hypoglycemia, orthostatic hypotension, delirium, alcohol, or even Seroquel .  Orthostatics, hold sedatives including Seroquel , Atarax.  Monitor serum glucose.  Aspirin  on board.  Hypertension - Will hold antihypertensives to allow for permissive hypertension x 24 hours.  Hydralazine  for systolic blood pressure greater than 220, diastolic greater than 120.  Asthma - Not currently exacerbated.  Anxiety - Continue Lexapro .  Will hold off on nightly Seroquel  for now may exacerbate patient's lethargy.  Alcohol use with history of drawl - Patient and wife stated that he is mostly quit alcohol, only drinks 1-2 beers per day.  After previous admission with CIWA, had stopped drinking for over a month.  Will hold off on CIWA for the time being.    Advance Care Planning:   Code Status: Full Code   Consults:  Neurology  Family Communication: Wife at bedside  Severity of Illness: The appropriate patient status for this patient is OBSERVATION. Observation status is judged to be reasonable and necessary in order to provide the required intensity of service to ensure the patient's safety. The patient's presenting symptoms, physical exam findings, and initial radiographic and laboratory data in the context of their medical condition is felt to place them at decreased risk for further clinical deterioration. Furthermore, it is anticipated that the patient will be medically stable for discharge from the hospital within 2 midnights of admission.   Author: Jodeane Mulligan, DO 08/11/2023 5:35 PM  For on call review [email or web address].

## 2023-08-11 NOTE — ED Notes (Signed)
 PT ambulated to bathroom w/o assistance.

## 2023-08-11 NOTE — ED Notes (Signed)
 Called and Placed PT on monitor with CCMD

## 2023-08-11 NOTE — ED Provider Notes (Signed)
 [facility] Provider Note   CSN: [id number] Arrival date & time: 08/11/23  1135     History  Chief Complaint  Patient presents with   Aphasia   Headache   Weakness    Ralph [name] is a 73 y.o. male.  HPI Patient presents with his wife who assist with history. Patient's history is notable for hospitalization earlier this year after an episode of unresponsiveness.  Patient with aspiration pneumonia, intubation, prolonged hospitalization and outpatient therapy.  However, he has been in his usual state of health until today.  Overnight, about 12 hours ago the patient had change of speech, headache, generalized discomfort no facial droop.  Symptoms resolved entirely and currently he states that he is 95% back to normal, feeling weak in general without focality currently.     Home Medications Prior to Admission medications   Medication Sig Start Date End Date Taking? Authorizing Provider  Budeson-Glycopyrrol-Formoterol  (BREZTRI AEROSPHERE) 160-9-4.8 MCG/ACT AERO Inhale 1 puff into the lungs daily.    [provider]  celecoxib (CELEBREX) 100 MG capsule Take 100 mg by mouth daily as needed for mild pain (pain score 1-3) or moderate pain (pain score 4-6). 06/16/23   [provider]  escitalopram  (LEXAPRO ) 20 MG tablet Take 20 mg by mouth daily.    [provider]  hydrOXYzine (ATARAX) 25 MG tablet Take 25 mg by mouth daily as needed for anxiety. 07/20/23   [provider]  omeprazole (PRILOSEC) 20 MG capsule Take 20 mg by mouth daily.    [provider]  QUEtiapine  (SEROQUEL ) 100 MG tablet Take 1 tablet (100 mg total) by mouth at bedtime. 06/08/23 07/30/23  Magdalene School, MD  tamsulosin  (FLOMAX ) 0.4 MG CAPS capsule Take 0.4 mg by mouth at bedtime.    [provider]  ZETIA 10 MG tablet Take 10 mg by mouth daily. 06/15/23   [provider]      Allergies    Statins, Latex, and  Morphine and codeine    Review of Systems   Review of Systems  Physical Exam Updated Vital Signs BP 112/75 (BP Location: Right Arm)   Pulse 71   Temp 98 F (36.7 C) (Oral)   Resp 16   Ht 6' (1.829 m)   Wt 87 kg   SpO2 98%   BMI 26.01 kg/m  Physical Exam Vitals and nursing note reviewed.  Constitutional:      General: He is not in acute distress.    Appearance: He is well-developed.  HENT:     Head: Normocephalic and atraumatic.  Eyes:     Conjunctiva/sclera: Conjunctivae normal.  Cardiovascular:     Rate and Rhythm: Normal rate and regular rhythm.  Pulmonary:     Effort: Pulmonary effort is normal. No respiratory distress.     Breath sounds: No stridor.  Abdominal:     General: There is no distension.  Skin:    General: Skin is warm and dry.  Neurological:     Mental Status: He is alert and oriented to person, place, and time. Mental status is at baseline.     Cranial Nerves: No cranial nerve deficit or dysarthria.     Coordination: Coordination normal.  Psychiatric:        Mood and Affect: Mood normal.     ED Results / Procedures / Treatments   Labs (all labs ordered are listed, but only abnormal results are displayed) Labs Reviewed  CBC - Abnormal; Notable  for the following components:      Result Value   MCV 101.4 (*)    MCH 34.4 (*)    All other components within normal limits  COMPREHENSIVE METABOLIC PANEL WITH GFR - Abnormal; Notable for the following components:   CO2 21 (*)    Glucose, Bld 105 (*)    Total Protein 6.2 (*)    All other components within normal limits  I-STAT CHEM 8, ED - Abnormal; Notable for the following components:   Creatinine, Ser 1.40 (*)    Glucose, Bld 106 (*)    All other components within normal limits  PROTIME-INR  APTT  DIFFERENTIAL  ETHANOL  CBG MONITORING, ED    EKG None  Radiology CT HEAD WO CONTRAST Result Date: 08/11/2023 CLINICAL DATA:  Neurologic deficit.  Headache and slurred speech. EXAM: CT HEAD  WITHOUT CONTRAST TECHNIQUE: Contiguous axial images were obtained from the base of the skull through the vertex without intravenous contrast. RADIATION DOSE REDUCTION: This exam was performed according to the departmental dose-optimization program which includes automated exposure control, adjustment of the mA and/or kV according to patient size and/or use of iterative reconstruction technique. COMPARISON:  Head CT dated 05/25/2023. FINDINGS: Brain: Mild age-related atrophy and chronic microvascular ischemic changes. There is no acute intracranial hemorrhage. No mass effect or midline shift. No extra-axial fluid collection. Vascular: No hyperdense vessel or unexpected calcification. Skull: Normal. Negative for fracture or focal lesion. Sinuses/Orbits: There is mucoperiosteal thickening of paranasal sinuses. No air-fluid level the mastoid air cells are clear. Other: None IMPRESSION: 1. No acute intracranial pathology. 2. Mild age-related atrophy and chronic microvascular ischemic changes. Electronically Signed   By: Angus Bark M.D.   On: 08/11/2023 14:06    Procedures Procedures    Medications Ordered in ED Medications - No data to display  ED Course/ Medical Decision Making/ A&P                                 Medical Decision Making Patient with history of recent escalation for pneumonia, but with seemingly complete recovery now presents after episode of confusion, dysarthria that resolved prior to ED arrival.  Patient's current neuroexam is reassuring, vitals reassuring, concern for this episode includes consideration of a TIA versus intracranial abnormality versus infection. Cardiac 70 sinus normal pulse ox 100% room air normal  Amount and/or Complexity of Data Reviewed Independent Historian: spouse External Data Reviewed: notes. Labs: ordered. Decision-making details documented in ED Course. Radiology: ordered and independent interpretation performed. Decision-making details  documented in ED Course. ECG/medicine tests: ordered.  Risk Decision regarding hospitalization.   Initial labs reassuring, unremarkable, head CT without mass.  I discussed the patient case with our neurologist and he will be seen in consultation. With reassuring head CT, labs, vitals unchanged for hours and monitoring, reassurance for low suspicion of infection, mass, arrhythmia. MRI pending.  Final Clinical Impression(s) / ED Diagnoses Final diagnoses:  Dysarthria  Confusion    Rx / DC Orders ED Discharge Orders     None         Dorenda Gandy, MD 08/11/23 1535

## 2023-08-11 NOTE — ED Provider Triage Note (Signed)
 Emergency Medicine Provider Triage Evaluation Note  HAZIEL MOLNER , a 73 y.o. male  was evaluated in triage.  Pt complains of headache slight slurred speech overnight that persisted to monitor this morning.  Reports no vision changes.  Felt like he could not exhale, and at the same time noted that his speech sounded slurred.  Denies aphasia, no facial droop or unilateral weakness no chest pain no shortness of breath.  Review of Systems  Positive: Headache  Negative: Chest pain   Physical Exam  BP (!) 108/92 (BP Location: Right Arm)   Pulse 72   Temp 98 F (36.7 C) (Oral)   Resp 18   Ht 6' (1.829 m)   Wt 87 kg   SpO2 96%   BMI 26.01 kg/m  Gen:   Awake, no distress   Resp:  Normal effort, clear  MSK:   Moves extremities without difficulty  Other:  No dysarthria or aphasia. Normal neuro exam.   Medical Decision Making  Medically screening exam initiated at 12:51 PM.  Appropriate orders placed.  Brennan Camara was informed that the remainder of the evaluation will be completed by another provider, this initial triage assessment does not replace that evaluation, and the importance of remaining in the ED until their evaluation is complete.  Normal neuro exam. Symptoms seemingly resolved. Work up in [place] with negative CT/CTA/MRI.    Rolinda Climes, DO 08/11/23 1255

## 2023-08-11 NOTE — ED Provider Notes (Signed)
 Pt signed out by Dr. Liam Redhead pending MRI results.  Images reviewed by me.  I agree with the radiologist.  Motion degraded examination without evidence of an acute  intracranial abnormality.  2. 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   Pt d/w [facility] ([facility]) for admission.   Sueellen Emery, MD 08/11/23 [phone]

## 2023-08-11 NOTE — Progress Notes (Signed)
 Brief Neuro Note:  Neurology consult was requested on Mr. Ralph [name]. I spoke with his RN Cathleen Coach and he is nowhere to be found. There is concern that patient left the hospital without notifying staff. RN has attempted to call but cannot get in touch with patient or family over the phone.  Please call neurology if the patient comes back to the hospital. At this time, we will signoff.  Lynnell Fiumara [facility]

## 2023-08-11 NOTE — ED Triage Notes (Signed)
 BIB EMS from home. Pt went to bad at 1030 last night work up at 1230 am with headache and slurred speech and unable to swallow water. Went back to sleep woke up at 10am with slurred speech and difficult swallowing. EMS called at 1045 no slurred speech or swallowing complaints when ems arrived did complain of weakness upon standing to get onto stretcher.   20g Right hand 126/70 16 RR 70 pulse CBG 142

## 2023-08-11 NOTE — ED Notes (Signed)
 Report given to receiving RN.

## 2023-08-12 ENCOUNTER — Encounter (HOSPITAL_COMMUNITY)

## 2023-08-12 NOTE — ED Notes (Signed)
 Spoke with Ralph [name] significant other who reports patient got tired of waiting and walked out and he is home and safe.

## 2023-08-12 NOTE — ED Notes (Addendum)
 Attempted to call patient and emergency contact with no answer

## 2023-08-17 DIAGNOSIS — R471 Dysarthria and anarthria: Secondary | ICD-10-CM | POA: Diagnosis not present

## 2023-08-17 DIAGNOSIS — F411 Generalized anxiety disorder: Secondary | ICD-10-CM | POA: Diagnosis not present

## 2023-08-17 DIAGNOSIS — F5101 Primary insomnia: Secondary | ICD-10-CM | POA: Diagnosis not present

## 2023-09-23 DIAGNOSIS — K080 Exfoliation of teeth due to systemic causes: Secondary | ICD-10-CM | POA: Diagnosis not present

## 2023-10-15 DIAGNOSIS — F411 Generalized anxiety disorder: Secondary | ICD-10-CM | POA: Diagnosis not present

## 2023-10-15 DIAGNOSIS — E785 Hyperlipidemia, unspecified: Secondary | ICD-10-CM | POA: Diagnosis not present

## 2023-10-15 DIAGNOSIS — J449 Chronic obstructive pulmonary disease, unspecified: Secondary | ICD-10-CM | POA: Diagnosis not present

## 2023-10-15 DIAGNOSIS — I10 Essential (primary) hypertension: Secondary | ICD-10-CM | POA: Diagnosis not present

## 2023-10-19 DIAGNOSIS — E785 Hyperlipidemia, unspecified: Secondary | ICD-10-CM | POA: Diagnosis not present

## 2023-10-19 DIAGNOSIS — J449 Chronic obstructive pulmonary disease, unspecified: Secondary | ICD-10-CM | POA: Diagnosis not present

## 2023-10-19 DIAGNOSIS — R0609 Other forms of dyspnea: Secondary | ICD-10-CM | POA: Diagnosis not present

## 2023-10-19 DIAGNOSIS — I10 Essential (primary) hypertension: Secondary | ICD-10-CM | POA: Diagnosis not present

## 2023-12-03 DIAGNOSIS — J449 Chronic obstructive pulmonary disease, unspecified: Secondary | ICD-10-CM | POA: Diagnosis not present

## 2023-12-03 DIAGNOSIS — I2510 Atherosclerotic heart disease of native coronary artery without angina pectoris: Secondary | ICD-10-CM | POA: Diagnosis not present

## 2023-12-03 DIAGNOSIS — R0609 Other forms of dyspnea: Secondary | ICD-10-CM | POA: Diagnosis not present

## 2023-12-03 DIAGNOSIS — E7800 Pure hypercholesterolemia, unspecified: Secondary | ICD-10-CM | POA: Diagnosis not present

## 2023-12-07 ENCOUNTER — Ambulatory Visit: Admitting: Medical

## 2023-12-07 NOTE — Progress Notes (Deleted)
  Cardiology Office Note   Date:  12/07/2023  ID:  Ralph [name], DOB [birth year], MRN [id number] PCP: Cleotilde Planas, MD  [facility] Providers Cardiologist:New  History of Present Illness Ralph [name] is a 74 y.o. male with a h/o HTN, HLD, anxiety, alcohol use, asthma who presents as a new patient.   The patient     Studies Reviewed      *** Risk Assessment/Calculations {Does this patient have ATRIAL FIBRILLATION?[phone]} No BP recorded.  {Refresh Note OR Click here to enter BP  :1}***       Physical Exam VS:  There were no vitals taken for this visit.       Wt Readings from Last 3 Encounters:  08/11/23 191 lb 12.8 oz (87 kg)  07/30/23 194 lb (88 kg)  06/09/23 181 lb 4.8 oz (82.2 kg)    GEN: Well nourished, well developed in no acute distress NECK: No JVD; No carotid bruits CARDIAC: ***RRR, no murmurs, rubs, gallops RESPIRATORY:  Clear to auscultation without rales, wheezing or rhonchi  ABDOMEN: Soft, non-tender, non-distended EXTREMITIES:  No edema; No deformity   ASSESSMENT AND PLAN ***    {Are you ordering a CV Procedure (e.g. stress test, cath, DCCV, TEE, etc)?   Press F2        :[id number]}  Dispo: ***  Signed, Kyona Chauncey VEAR Fishman, PA-C

## 2023-12-10 ENCOUNTER — Other Ambulatory Visit: Payer: Self-pay

## 2023-12-10 ENCOUNTER — Observation Stay (HOSPITAL_COMMUNITY)
Admission: EM | Admit: 2023-12-10 | Discharge: 2023-12-11 | Disposition: A | Discharge status: Home / Self Care | Attending: Internal Medicine | Admitting: Internal Medicine

## 2023-12-10 ENCOUNTER — Emergency Department (HOSPITAL_COMMUNITY)

## 2023-12-10 DIAGNOSIS — N179 Acute kidney failure, unspecified: Secondary | ICD-10-CM | POA: Insufficient documentation

## 2023-12-10 DIAGNOSIS — E785 Hyperlipidemia, unspecified: Secondary | ICD-10-CM | POA: Diagnosis not present

## 2023-12-10 DIAGNOSIS — R079 Chest pain, unspecified: Secondary | ICD-10-CM | POA: Diagnosis not present

## 2023-12-10 DIAGNOSIS — J9601 Acute respiratory failure with hypoxia: Principal | ICD-10-CM | POA: Insufficient documentation

## 2023-12-10 DIAGNOSIS — J441 Chronic obstructive pulmonary disease with (acute) exacerbation: Secondary | ICD-10-CM | POA: Diagnosis present

## 2023-12-10 DIAGNOSIS — F1010 Alcohol abuse, uncomplicated: Secondary | ICD-10-CM | POA: Insufficient documentation

## 2023-12-10 DIAGNOSIS — R0789 Other chest pain: Secondary | ICD-10-CM | POA: Insufficient documentation

## 2023-12-10 DIAGNOSIS — Z87891 Personal history of nicotine dependence: Secondary | ICD-10-CM | POA: Diagnosis not present

## 2023-12-10 DIAGNOSIS — J9611 Chronic respiratory failure with hypoxia: Secondary | ICD-10-CM | POA: Diagnosis present

## 2023-12-10 DIAGNOSIS — Z91040 Latex allergy status: Secondary | ICD-10-CM | POA: Insufficient documentation

## 2023-12-10 DIAGNOSIS — Z7982 Long term (current) use of aspirin: Secondary | ICD-10-CM | POA: Diagnosis not present

## 2023-12-10 DIAGNOSIS — K219 Gastro-esophageal reflux disease without esophagitis: Secondary | ICD-10-CM | POA: Insufficient documentation

## 2023-12-10 DIAGNOSIS — R0602 Shortness of breath: Secondary | ICD-10-CM | POA: Diagnosis not present

## 2023-12-10 DIAGNOSIS — D7589 Other specified diseases of blood and blood-forming organs: Secondary | ICD-10-CM | POA: Insufficient documentation

## 2023-12-10 DIAGNOSIS — J449 Chronic obstructive pulmonary disease, unspecified: Secondary | ICD-10-CM | POA: Diagnosis not present

## 2023-12-10 DIAGNOSIS — N400 Enlarged prostate without lower urinary tract symptoms: Secondary | ICD-10-CM | POA: Insufficient documentation

## 2023-12-10 LAB — CBC
HCT: 45.4 % (ref 39.0–52.0)
Hemoglobin: 15.7 g/dL (ref 13.0–17.0)
MCH: 35 pg — ABNORMAL HIGH (ref 26.0–34.0)
MCHC: 34.6 g/dL (ref 30.0–36.0)
MCV: 101.3 fL — ABNORMAL HIGH (ref 80.0–100.0)
Platelets: 197 K/uL (ref 150–400)
RBC: 4.48 MIL/uL (ref 4.22–5.81)
RDW: 13.6 % (ref 11.5–15.5)
WBC: 9.4 K/uL (ref 4.0–10.5)
nRBC: 0 % (ref 0.0–0.2)

## 2023-12-10 LAB — BASIC METABOLIC PANEL WITH GFR
Anion gap: 14 (ref 5–15)
BUN: 14 mg/dL (ref 8–23)
CO2: 22 mmol/L (ref 22–32)
Calcium: 9.6 mg/dL (ref 8.9–10.3)
Chloride: 105 mmol/L (ref 98–111)
Creatinine, Ser: 1.29 mg/dL — ABNORMAL HIGH (ref 0.61–1.24)
GFR, Estimated: 59 mL/min — ABNORMAL LOW (ref >60)
Glucose, Bld: 105 mg/dL — ABNORMAL HIGH (ref 70–99)
Potassium: 4.6 mmol/L (ref 3.5–5.1)
Sodium: 141 mmol/L (ref 135–145)

## 2023-12-10 LAB — TROPONIN I (HIGH SENSITIVITY): Troponin I (High Sensitivity): 5 ng/L (ref <18)

## 2023-12-10 NOTE — ED Notes (Signed)
 Pt coming from xray to room 17

## 2023-12-10 NOTE — ED Triage Notes (Signed)
 Pt was drinking water when he started feeling like an elephant was sitting on chest. Also having SOB. Pain is left sided and does not radiate. Does believe he has blockage in his heart. He is set up with cardiology. Pt has been having increased SOB for the last month as well

## 2023-12-10 NOTE — ED Notes (Signed)
 Pt's wife stated that the Pt can not breathe and he needs oxygen. Pt placed on 2L for comfort.

## 2023-12-11 ENCOUNTER — Telehealth: Payer: Self-pay | Admitting: Cardiology

## 2023-12-11 ENCOUNTER — Other Ambulatory Visit (HOSPITAL_COMMUNITY): Payer: Self-pay

## 2023-12-11 ENCOUNTER — Encounter (HOSPITAL_COMMUNITY): Payer: Self-pay

## 2023-12-11 DIAGNOSIS — J9611 Chronic respiratory failure with hypoxia: Secondary | ICD-10-CM | POA: Diagnosis present

## 2023-12-11 DIAGNOSIS — I10 Essential (primary) hypertension: Secondary | ICD-10-CM

## 2023-12-11 DIAGNOSIS — J449 Chronic obstructive pulmonary disease, unspecified: Secondary | ICD-10-CM | POA: Diagnosis not present

## 2023-12-11 DIAGNOSIS — I2510 Atherosclerotic heart disease of native coronary artery without angina pectoris: Secondary | ICD-10-CM | POA: Diagnosis not present

## 2023-12-11 DIAGNOSIS — J9601 Acute respiratory failure with hypoxia: Secondary | ICD-10-CM | POA: Diagnosis not present

## 2023-12-11 DIAGNOSIS — D7589 Other specified diseases of blood and blood-forming organs: Secondary | ICD-10-CM | POA: Insufficient documentation

## 2023-12-11 DIAGNOSIS — R0600 Dyspnea, unspecified: Secondary | ICD-10-CM | POA: Diagnosis not present

## 2023-12-11 DIAGNOSIS — R079 Chest pain, unspecified: Secondary | ICD-10-CM

## 2023-12-11 DIAGNOSIS — F1010 Alcohol abuse, uncomplicated: Secondary | ICD-10-CM | POA: Insufficient documentation

## 2023-12-11 DIAGNOSIS — E785 Hyperlipidemia, unspecified: Secondary | ICD-10-CM | POA: Diagnosis not present

## 2023-12-11 DIAGNOSIS — R0789 Other chest pain: Secondary | ICD-10-CM

## 2023-12-11 LAB — HEPATIC FUNCTION PANEL
ALT: 38 U/L (ref 0–44)
AST: 39 U/L (ref 15–41)
Albumin: 4.1 g/dL (ref 3.5–5.0)
Alkaline Phosphatase: 52 U/L (ref 38–126)
Bilirubin, Direct: 0.2 mg/dL (ref 0.0–0.2)
Indirect Bilirubin: 0.8 mg/dL (ref 0.3–0.9)
Total Bilirubin: 1 mg/dL (ref 0.0–1.2)
Total Protein: 7.1 g/dL (ref 6.5–8.1)

## 2023-12-11 LAB — BASIC METABOLIC PANEL WITH GFR
Anion gap: 12 (ref 5–15)
BUN: 15 mg/dL (ref 8–23)
CO2: 22 mmol/L (ref 22–32)
Calcium: 9.6 mg/dL (ref 8.9–10.3)
Chloride: 103 mmol/L (ref 98–111)
Creatinine, Ser: 1.29 mg/dL — ABNORMAL HIGH (ref 0.61–1.24)
GFR, Estimated: 59 mL/min — ABNORMAL LOW (ref >60)
Glucose, Bld: 292 mg/dL — ABNORMAL HIGH (ref 70–99)
Potassium: 4.6 mmol/L (ref 3.5–5.1)
Sodium: 137 mmol/L (ref 135–145)

## 2023-12-11 LAB — CBC WITH DIFFERENTIAL/PLATELET
Abs Immature Granulocytes: 0.06 K/uL (ref 0.00–0.07)
Basophils Absolute: 0 K/uL (ref 0.0–0.1)
Basophils Relative: 0 %
Eosinophils Absolute: 0 K/uL (ref 0.0–0.5)
Eosinophils Relative: 0 %
HCT: 43.5 % (ref 39.0–52.0)
Hemoglobin: 15.3 g/dL (ref 13.0–17.0)
Immature Granulocytes: 0 %
Lymphocytes Relative: 3 %
Lymphs Abs: 0.4 K/uL — ABNORMAL LOW (ref 0.7–4.0)
MCH: 34.9 pg — ABNORMAL HIGH (ref 26.0–34.0)
MCHC: 35.2 g/dL (ref 30.0–36.0)
MCV: 99.3 fL (ref 80.0–100.0)
Monocytes Absolute: 0.2 K/uL (ref 0.1–1.0)
Monocytes Relative: 1 %
Neutro Abs: 13.1 K/uL — ABNORMAL HIGH (ref 1.7–7.7)
Neutrophils Relative %: 96 %
Platelets: 179 K/uL (ref 150–400)
RBC: 4.38 MIL/uL (ref 4.22–5.81)
RDW: 13.7 % (ref 11.5–15.5)
WBC: 13.8 K/uL — ABNORMAL HIGH (ref 4.0–10.5)
nRBC: 0 % (ref 0.0–0.2)

## 2023-12-11 LAB — TROPONIN I (HIGH SENSITIVITY): Troponin I (High Sensitivity): 5 ng/L (ref <18)

## 2023-12-11 LAB — D-DIMER, QUANTITATIVE: D-Dimer, Quant: <0.27 ug{FEU}/mL (ref 0.00–0.50)

## 2023-12-11 MED ORDER — LORAZEPAM 1 MG PO TABS: 1.0000 mg | ORAL_TABLET | ORAL | Status: DC | PRN

## 2023-12-11 MED ORDER — THIAMINE MONONITRATE 100 MG PO TABS
100.0000 mg | ORAL_TABLET | Freq: Every day | ORAL | Status: DC
  Administered 2023-12-11: 100 mg via ORAL
  Filled 2023-12-11: qty 1

## 2023-12-11 MED ORDER — PREDNISONE 10 MG PO TABS: 40.0000 mg | ORAL_TABLET | Freq: Every day | ORAL | 0 refills | Status: AC

## 2023-12-11 MED ORDER — METOPROLOL TARTRATE 100 MG PO TABS
100.0000 mg | ORAL_TABLET | Freq: Once | ORAL | 0 refills | Status: DC
  Filled 2023-12-11 (×2): qty 1, 1d supply, fill #0

## 2023-12-11 MED ORDER — ASPIRIN 81 MG PO TBEC: 81.0000 mg | DELAYED_RELEASE_TABLET | Freq: Every day | ORAL | 12 refills | Status: AC

## 2023-12-11 MED ORDER — ADULT MULTIVITAMIN W/MINERALS CH
1.0000 | ORAL_TABLET | Freq: Every day | ORAL | Status: DC
  Administered 2023-12-11: 1 via ORAL
  Filled 2023-12-11: qty 1

## 2023-12-11 MED ORDER — IPRATROPIUM-ALBUTEROL 0.5-2.5 (3) MG/3ML IN SOLN
3.0000 mL | RESPIRATORY_TRACT | Status: AC
  Administered 2023-12-11 (×3): 3 mL via RESPIRATORY_TRACT
  Filled 2023-12-11: qty 9

## 2023-12-11 MED ORDER — AMLODIPINE BESYLATE 5 MG PO TABS: 5.0000 mg | ORAL_TABLET | Freq: Every day | ORAL | 2 refills | Status: AC

## 2023-12-11 MED ORDER — ASPIRIN 81 MG PO TBEC
81.0000 mg | DELAYED_RELEASE_TABLET | Freq: Every day | ORAL | Status: DC
  Administered 2023-12-11: 81 mg via ORAL
  Filled 2023-12-11: qty 1

## 2023-12-11 MED ORDER — TAMSULOSIN HCL 0.4 MG PO CAPS: 0.4000 mg | ORAL_CAPSULE | Freq: Every day | ORAL | Status: DC

## 2023-12-11 MED ORDER — ROSUVASTATIN CALCIUM 20 MG PO TABS
40.0000 mg | ORAL_TABLET | Freq: Every day | ORAL | Status: DC
  Administered 2023-12-11: 40 mg via ORAL
  Filled 2023-12-11: qty 2

## 2023-12-11 MED ORDER — THIAMINE HCL 100 MG/ML IJ SOLN
100.0000 mg | Freq: Every day | INTRAMUSCULAR | Status: DC
  Filled 2023-12-11: qty 2

## 2023-12-11 MED ORDER — FOLIC ACID 1 MG PO TABS
1.0000 mg | ORAL_TABLET | Freq: Every day | ORAL | Status: DC
  Administered 2023-12-11: 1 mg via ORAL
  Filled 2023-12-11: qty 1

## 2023-12-11 MED ORDER — NITROGLYCERIN 0.4 MG SL SUBL
0.4000 mg | SUBLINGUAL_TABLET | SUBLINGUAL | Status: DC | PRN
  Administered 2023-12-11: 0.4 mg via SUBLINGUAL
  Filled 2023-12-11 (×2): qty 1

## 2023-12-11 MED ORDER — PANTOPRAZOLE SODIUM 40 MG PO TBEC
40.0000 mg | DELAYED_RELEASE_TABLET | Freq: Every day | ORAL | Status: DC
  Administered 2023-12-11: 40 mg via ORAL
  Filled 2023-12-11: qty 1

## 2023-12-11 MED ORDER — AMLODIPINE BESYLATE 5 MG PO TABS
5.0000 mg | ORAL_TABLET | Freq: Every day | ORAL | Status: DC
  Administered 2023-12-11: 5 mg via ORAL
  Filled 2023-12-11: qty 1

## 2023-12-11 MED ORDER — LORAZEPAM 2 MG/ML IJ SOLN: 1.0000 mg | INTRAMUSCULAR | Status: DC | PRN

## 2023-12-11 MED ORDER — ESCITALOPRAM OXALATE 10 MG PO TABS
20.0000 mg | ORAL_TABLET | Freq: Every day | ORAL | Status: DC
  Administered 2023-12-11: 20 mg via ORAL
  Filled 2023-12-11: qty 2

## 2023-12-11 MED ORDER — HYDRALAZINE HCL 20 MG/ML IJ SOLN: 10.0000 mg | INTRAMUSCULAR | Status: DC | PRN

## 2023-12-11 MED ORDER — COMBIVENT RESPIMAT 20-100 MCG/ACT IN AERS: 1.0000 | INHALATION_SPRAY | Freq: Four times a day (QID) | RESPIRATORY_TRACT | 2 refills | Status: DC | PRN

## 2023-12-11 MED ORDER — IPRATROPIUM-ALBUTEROL 0.5-2.5 (3) MG/3ML IN SOLN
3.0000 mL | RESPIRATORY_TRACT | Status: DC
  Administered 2023-12-11: 3 mL via RESPIRATORY_TRACT
  Filled 2023-12-11: qty 3

## 2023-12-11 MED ORDER — IPRATROPIUM-ALBUTEROL 0.5-2.5 (3) MG/3ML IN SOLN: 3.0000 mL | RESPIRATORY_TRACT | Status: DC | PRN

## 2023-12-11 MED ORDER — BUDESONIDE 0.25 MG/2ML IN SUSP
0.2500 mg | Freq: Two times a day (BID) | RESPIRATORY_TRACT | Status: DC
  Administered 2023-12-11: 0.25 mg via RESPIRATORY_TRACT
  Filled 2023-12-11: qty 2

## 2023-12-11 MED ORDER — METHYLPREDNISOLONE SODIUM SUCC 125 MG IJ SOLR
125.0000 mg | Freq: Once | INTRAMUSCULAR | Status: AC
  Administered 2023-12-11: 125 mg via INTRAVENOUS
  Filled 2023-12-11: qty 2

## 2023-12-11 MED ORDER — EZETIMIBE 10 MG PO TABS
10.0000 mg | ORAL_TABLET | Freq: Every day | ORAL | Status: DC
Start: 2023-12-11 — End: 2023-12-11
  Administered 2023-12-11: 10 mg via ORAL
  Filled 2023-12-11: qty 1

## 2023-12-11 MED ORDER — ENOXAPARIN SODIUM 40 MG/0.4ML IJ SOSY
40.0000 mg | PREFILLED_SYRINGE | INTRAMUSCULAR | Status: DC
  Administered 2023-12-11: 40 mg via SUBCUTANEOUS
  Filled 2023-12-11: qty 0.4

## 2023-12-11 MED ORDER — IPRATROPIUM-ALBUTEROL 0.5-2.5 (3) MG/3ML IN SOLN
3.0000 mL | Freq: Three times a day (TID) | RESPIRATORY_TRACT | Status: DC
  Filled 2023-12-11: qty 3

## 2023-12-11 NOTE — Progress Notes (Signed)
 Discharge instructions reviewed with pt and his wife.  Copy of instructions given to pt. Pt informed his scripts were sent to his pharmacy and will need to be picked up. Pt has one script that was sent to the [facility], metoprolol  for his cardiac CT. This script will be filled here by [facility] and will be picked up on the way out for discharge, pt has been instructed not to take the medication until he is instructed to do so by the cardiology office/cardiac CT staff. Pt and wife verbalize understanding of all instructions. Pt's O2 tank has arrived, pt will go home on O2 2L [place], pt was shown how to use the portable O2 tank.  Pt will be d/c'd via wheelchair with belongings, on his O2 and will be escorted by staff.  Rosezella Kronick,RN SWOT

## 2023-12-11 NOTE — Telephone Encounter (Signed)
 Patient currently here for atypical chest pain, please help him arrange outpatient coronary CTA.  Ordering 1 dose of metoprolol  tartrate 100 mg for elevated heart rates and blood pressure according to protocol.  Plan likely for discharge.

## 2023-12-11 NOTE — Care Management Obs Status (Signed)
 MEDICARE OBSERVATION STATUS NOTIFICATION   Patient Details  Name: Ralph [name] MRN: [id number] Date of Birth: 02-Mar-1951   Medicare Observation Status Notification Given:  Yes    Vonzell Arrie Sharps 12/11/2023, 8:53 AM

## 2023-12-11 NOTE — Consult Note (Addendum)
 As below, patient seen and examined.  Briefly he is a 73 year old male with past medical history of COPD, tobacco/alcohol use, hyperlipidemia, nonobstructive coronary disease for evaluation of chest pain.  Coronary CTA October 2017 showed calcium  score 15 which was 27 percentile and moderate disease in OM 2.  Echocardiogram February 2025 showed normal LV function.  Patient states that he was drinking water yesterday and became choked.  He then developed shortness of breath associated with wheezing.  He had chest tightness that increased with cough.  He does have chronic dyspnea on exertion that is worse.  There is no orthopnea, PND, pedal edema, exertional chest pain or syncope.  Cardiology now asked to evaluate.  His electrocardiogram shows sinus tachycardia with no ST changes.  Troponins are 5 and 5.  Hemoglobin 15.7, D-dimer less than 0.27, chest x-ray with no infiltrates.  1 chest pain-symptoms are extremely atypical and likely musculoskeletal/pulmonary related.  They increased with cough.  His enzymes are negative and his electrocardiogram shows no ST changes.  Patient can be discharged from a cardiac standpoint.  2 dyspnea on exertion-patient was scheduled to be seen for this issue in our office.  I think this likely represents progressive COPD.  He does have a history of moderate coronary disease.  Will repeat CTA to rule out contribution from coronary disease.  Note D-dimer is normal.  3 coronary artery disease-moderate on previous CTA.  Add aspirin  81 mg daily.  Continue statin.  4 hyperlipidemia-continue Crestor  and Zetia .  5 hypertension-significant elevation in blood pressure.  Will add amlodipine  5 mg daily and follow blood pressure.  6 COPD-per primary care.  Redell Shallow, MD      Cardiology Consultation   Patient ID: Ralph [name] MRN: [id number]; DOB: [birth year]  Admit date: 12/10/2023 Date of Consult: 12/11/2023  PCP:  Cleotilde Planas, MD   [facility]  Providers Cardiologist:  Wilbert Bihari, MD   Patient Profile: Ralph [name] is a 73 y.o. male with a hx of asthma followed by pulmonology, hyperlipidemia, GERD, CKD, alcohol use, former smoker,who is being seen 12/11/2023 for the evaluation of chest pain at the request of Dr. Franky.  History of Present Illness: Ralph [name] has no significant cardiac history.  He has remote smoking history but stopped 12 years ago, probably 20+ pack year history.  Continues to drink about 4 ounces of vodka each day.  Not a known diabetic.  Denies any family history of MIs. We had seen him 12/2015 for complaints of DOE with known COPD/asthma for which he is followed by pulmonology for.  He had a stress test that had revealed partially reversible inferior and inferolateral defects felt to be infarct versus periischemia but no wall motion abnormalities.  EF preserved.  Follow-up coronary CTA demonstrated CAC score 15, there was moderate nonobstructive CAD in OM 2.  Plan was for medical management.  Since then he has not been seen by cardiology again.  He had plans to see us  outpatient next week   Currently patient is being evaluated for chest pain with negative troponins x 2 and asthma.  Patient reports that he has had persistent issues with aspiration since his ER admission in February 2025 where he required intubation given asthma exacerbation and alcohol withdrawal.  He had a coughing fit yesterday after drinking a cup of water and feels that he aspirated.  Started having chest pain that he describes as a dull pain but only occurs when he is coughing. Non radiating.  He reports  he never has any exertional chest pain/symptoms and has excellent functional capacity, he can walk 2 miles each day and occasionally has to rest. No peripheral edema, orthopnea.  D-dimer negative.  Potassium 4.6.  Creatinine 1.29, hemoglobin 15.7.  Chest x-ray negative.  Past Medical History:  Diagnosis Date   Arthritis    hands    Asthma    GERD (gastroesophageal reflux disease)    History of kidney stones    x2  -episodes   Hyperlipidemia    Renal disorder    Varicose veins    bilateral surgeries- no problems now    Past Surgical History:  Procedure Laterality Date   COLONOSCOPY WITH PROPOFOL  N/A 03/20/2015   Procedure: COLONOSCOPY WITH PROPOFOL ;  Surgeon: Gladis MARLA Louder, MD;  Location: [facility];  Service: Endoscopy;  Laterality: N/A;   HERNIA REPAIR     inguinal hernia   TONSILLECTOMY     VARICOSE VEIN SURGERY Bilateral    2 years ago   VASECTOMY       Scheduled Meds:  budesonide  (PULMICORT ) nebulizer solution  0.25 mg Nebulization BID   enoxaparin  (LOVENOX ) injection  40 mg Subcutaneous Q24H   escitalopram   20 mg Oral Daily   ezetimibe   10 mg Oral Daily   folic acid   1 mg Oral Daily   ipratropium-albuterol   3 mL Nebulization Q4H   multivitamin with minerals  1 tablet Oral Daily   pantoprazole   40 mg Oral Daily   rosuvastatin   40 mg Oral Daily   tamsulosin   0.4 mg Oral QPC supper   thiamine   100 mg Oral Daily   Or   thiamine   100 mg Intravenous Daily   Continuous Infusions:  PRN Meds: ipratropium-albuterol , LORazepam  **OR** LORazepam , nitroGLYCERIN   Allergies:    Allergies  Allergen Reactions   Statins Other (See Comments)    Myalgia- leg cramps   Latex Hives and Rash   Morphine And Codeine Hives    Social History:   Social History   Socioeconomic History   Marital status: Significant Other    Spouse name: Not on file   Number of children: Not on file   Years of education: Not on file   Highest education level: Not on file  Occupational History   Not on file  Tobacco Use   Smoking status: Former    Current packs/day: 0.00    Average packs/day: 0.5 packs/day for 20.0 years (10.0 ttl pk-yrs)    Types: Cigarettes    Start date: 04/21/1993    Quit date: 04/21/2013    Years since quitting: 10.6   Smokeless tobacco: Never  Vaping Use   Vaping status: Never Used  Substance  and Sexual Activity   Alcohol use: Yes    Comment: social   Drug use: No   Sexual activity: Not Currently  Other Topics Concern   Not on file  Social History Narrative   Not on file   Social Drivers of Health   Financial Resource Strain: Not on file  Food Insecurity: Unknown (12/11/2023)   Hunger Vital Sign    Worried About Running Out of Food in the Last Year: Never true    Ran Out of Food in the Last Year: Patient declined  Transportation Needs: No Transportation Needs (12/11/2023)   PRAPARE - Administrator, Civil Service (Medical): No    Lack of Transportation (Non-Medical): No  Physical Activity: Not on file  Stress: Not on file  Social Connections: Unknown (12/11/2023)   Social Connection  and Isolation Panel    Frequency of Communication with Friends and Family: Not on file    Frequency of Social Gatherings with Friends and Family: Not on file    Attends Religious Services: Not on file    Active Member of Clubs or Organizations: No    Attends Banker Meetings: Not on file    Marital Status: Not on file  Intimate Partner Violence: Not At Risk (12/11/2023)   Humiliation, Afraid, Rape, and Kick questionnaire    Fear of Current or Ex-Partner: No    Emotionally Abused: No    Physically Abused: No    Sexually Abused: No    Family History:   Family History  Problem Relation Age of Onset   Dementia Mother    COPD Father      ROS:  Please see the history of present illness.  All other ROS reviewed and negative.     Physical Exam/Data: Vitals:   12/11/23 0445 12/11/23 0502 12/11/23 0505 12/11/23 0600  BP:   (!) 149/112   Pulse:    99  Resp:   18   Temp: 98.3 F (36.8 C)  98.6 F (37 C)   TempSrc: Oral  Oral   SpO2:   92% 94%  Weight:  92.8 kg    Height:       No intake or output data in the 24 hours ending 12/11/23 0727    12/11/2023    5:02 AM 12/10/2023   10:32 PM 08/11/2023   11:42 AM  Last 3 Weights  Weight (lbs) 204 lb 9.6 oz 202  lb 191 lb 12.8 oz  Weight (kg) 92.806 kg 91.627 kg 87 kg     Body mass index is 27.75 kg/m.  General:  Well nourished, well developed, in no acute distress HEENT: normal Neck: no JVD Vascular: No carotid bruits; Distal pulses 2+ bilaterally Cardiac:  normal S1, S2; RRR; no murmur  Lungs: Coarse wheezes  abd: soft, nontender, no hepatomegaly  Ext: no edema Musculoskeletal:  No deformities, BUE and BLE strength normal and equal Skin: warm and dry  Neuro:  CNs 2-12 intact, no focal abnormalities noted Psych:  Normal affect   EKG:  The EKG was personally reviewed and demonstrates: Sinus tachycardia heart rate 111.  Wandering baseline with subtle ST elevation in v3. Telemetry:  Telemetry was personally reviewed and demonstrates: Sinus rhythm heart rates 90-110  Relevant CV Studies: Echocardiogram 05/25/2023  1. Left ventricular ejection fraction, by estimation, is 55 to 60%. The  left ventricle has normal function. The left ventricle has no regional  wall motion abnormalities. Left ventricular diastolic parameters were  normal.   2. Right ventricular systolic function is normal. The right ventricular  size is not well visualized.   3. The mitral valve is normal in structure. No evidence of mitral valve  regurgitation. No evidence of mitral stenosis.   4. The aortic valve was not well visualized. Aortic valve regurgitation  is not visualized. No aortic stenosis is present.   5. The inferior vena cava is dilated in size with >50% respiratory  variability, suggesting right atrial pressure of 8 mmHg.   Comparison(s): No significant change from prior study. Technically  challenging images due to poor windows, but no significant changes from  prior study noted.   Coronary CTA 01/2016 1. Coronary calcium  score of 15. This was 27 percentile for age and sex matched control.   2. Normal coronary origin with left dominance.   3. Moderate CAD  in the OM2 branch. An aggressive risk  factor modification is recommended.   Leim Moose  Laboratory Data: High Sensitivity Troponin:   Recent Labs  Lab 12/10/23 2245 12/11/23 0027  TROPONINIHS 5 5     Chemistry Recent Labs  Lab 12/10/23 2245  NA 141  K 4.6  CL 105  CO2 22  GLUCOSE 105*  BUN 14  CREATININE 1.29*  CALCIUM  9.6  GFRNONAA 59*  ANIONGAP 14    No results for input(s): PROT, ALBUMIN, AST, ALT, ALKPHOS, BILITOT in the last 168 hours. Lipids No results for input(s): CHOL, TRIG, HDL, LABVLDL, LDLCALC, CHOLHDL in the last 168 hours.  Hematology Recent Labs  Lab 12/10/23 2245  WBC 9.4  RBC 4.48  HGB 15.7  HCT 45.4  MCV 101.3*  MCH 35.0*  MCHC 34.6  RDW 13.6  PLT 197   Thyroid  No results for input(s): TSH, FREET4 in the last 168 hours.  BNPNo results for input(s): BNP, PROBNP in the last 168 hours.  DDimer  Recent Labs  Lab 12/11/23 0027  DDIMER <0.27    Radiology/Studies:  DG Chest 2 View Result Date: 12/10/2023 CLINICAL DATA:  Chest pain EXAM: CHEST - 2 VIEW COMPARISON:  12/03/2023 FINDINGS: The heart size and mediastinal contours are within normal limits. Both lungs are clear. The visualized skeletal structures are unremarkable. IMPRESSION: No active cardiopulmonary disease. Electronically Signed   By: Norman Gatlin M.D.   On: 12/10/2023 23:30     Assessment and Plan:  Atypical chest pain Nonobstructive CAD HLD - Low risk stress test 01/2016 - coronary CTA 01/2016 with moderate nonobstructive CAD in OM 2.   Chest pain only occurs when he has coughing fits. No complaints currently. He has excellent functional capacity and reports no exertional symptoms whatsoever.  EKG with no significant STTW changes. Troponins are negative x 2.   Given lack of symptoms and atypical presentation could consider repeating coronary CTA but likely could be done outpatient if symptoms become more concerning.  Start aspirin .  Continue rosuvastatin  40 mg and Zetia .   He does describe some muscle pains in his feet, may need to consider PCSK9 inhibitor outpatient. Will get lipid panel.  Alcohol use Continues to drink about 4 ounces of vodka daily.  Encourage cessation.  On CIWA protocol.  Asthma Followed by pulmonology, reports recurrent issues with aspiration. Likely has underlying COPD as well given his smoking history of 20+ years but quit smoking 12 years ago.  Risk Assessment/Risk Scores:   For questions or updates, please contact [facility] Please consult [email or web address] for contact info under    Signed, Thom LITTIE Sluder, PA-C  12/11/2023 7:27 AM

## 2023-12-11 NOTE — TOC CM/SW Note (Signed)
 Transition of Care [facility]) - Inpatient Brief Assessment   Patient Details  Name: Ralph [name] MRN: [id number] Date of Birth: 08/06/1950  Transition of [facility]) CM/SW Contact:    Waddell Barnie Rama, RN Phone Number: 12/11/2023, 1:10 PM   Clinical Narrative: From home with spouse, has PCP and insurance on file, states has no HH services in place at this time , has neb machine at home.  States family member (wife)  will transport them home at [employer] and family is support system, states gets medications from [facility] on [address].  Pta self ambulatory.   Patient will need home oxygen, he is ok with [employer] supplying the oxygen.  NCM made referral to [employer], the oxygen will be delivered to the room.    Transition of Care Asessment: Insurance and Status: Insurance coverage has been reviewed Patient has primary care physician: Yes Home environment has been reviewed: hoje with wife Prior level of function:: indep Prior/Current Home Services: No current home services Social Drivers of Health Review: SDOH reviewed no interventions necessary Readmission risk has been reviewed: Yes Transition of care needs: transition of care needs identified, TOC will continue to follow

## 2023-12-11 NOTE — Progress Notes (Signed)
 RT instructed pt to perform NIF, patient scored > than -40 w/ great pt effort.

## 2023-12-11 NOTE — Discharge Instructions (Signed)
 Cardiology office will assist arranging outpatient coronary CTA.

## 2023-12-11 NOTE — Progress Notes (Signed)
 NIF was performed with good pt effort.   > -40 NIF

## 2023-12-11 NOTE — ED Notes (Signed)
 Pt ambulated down the hall and O2 saturations were 89%. Pt became short of breath as well.

## 2023-12-11 NOTE — ED Notes (Signed)
 3E given courtesy call.

## 2023-12-11 NOTE — Progress Notes (Signed)
 SATURATION QUALIFICATIONS: (This note is used to comply with regulatory documentation for home oxygen)  Patient Saturations on Room Air at Rest = 94%  Patient Saturations on Room Air while Ambulating = 87%  Patient Saturations on 2 Liters of oxygen while Ambulating = 90%  Please briefly explain why patient needs home oxygen:  Patient needs oxygen while ambulating.Velinda Server, RN 12/11/2023 11:30 AM

## 2023-12-11 NOTE — Progress Notes (Signed)
 CSW addressed substance use consult at patients bedside. Resources provided.   Briar Sword, MSW, LCSWA Transitions of Care [phone]

## 2023-12-11 NOTE — H&P (Signed)
 History and Physical    Ralph [name] FMW:[id number] DOB: [birth year] DOA: 12/10/2023  Patient coming from: Home.  Chief Complaint: Shortness of breath.  HPI: Ralph [name] is a 73 y.o. male with history of COPD.  History of tobacco abuse quit more than 10 years ago, hyperlipidemia, GERD who has history of intubation in February of this year to protect airway in the setting of alcohol withdrawal presents to the ER with complaints of shortness of breath wheezing productive cough happened last evening when he was drinking a cup of water.  Patient thinks he might have aspirated.  Since then he has been wheezing and coughing.  During the same time patient also developed substernal chest pressure.  This lasted until patient came to the ER got nebulizer and nitroglycerin .  Patient states his primary care physician is planning to refer him to cardiologist and has an appointment next week with Dr. Shlomo for the shortness of breath which primary care thinks may be related to his heart.  ED Course: In the ER patient was requiring 2 L oxygen.  Chest x-ray was unremarkable.  Patient was given IV steroids nebulizer for wheezing for which wheezing improved but was still tachypneic.  Troponins and D-dimer were negative.  Labs show mildly elevated creatinine.  WBC 9.4   Review of Systems: As per HPI, rest all negative.   Past Medical History:  Diagnosis Date   Arthritis    hands   Asthma    GERD (gastroesophageal reflux disease)    History of kidney stones    x2  -episodes   Hyperlipidemia    Renal disorder    Varicose veins    bilateral surgeries- no problems now    Past Surgical History:  Procedure Laterality Date   COLONOSCOPY WITH PROPOFOL  N/A 03/20/2015   Procedure: COLONOSCOPY WITH PROPOFOL ;  Surgeon: Gladis MARLA Louder, MD;  Location: [facility];  Service: Endoscopy;  Laterality: N/A;   HERNIA REPAIR     inguinal hernia   TONSILLECTOMY     VARICOSE VEIN SURGERY Bilateral    2  years ago   VASECTOMY       reports that he quit smoking about 10 years ago. His smoking use included cigarettes. He started smoking about 30 years ago. He has a 10 pack-year smoking history. He has never used smokeless tobacco. He reports current alcohol use. He reports that he does not use drugs.  Allergies  Allergen Reactions   Statins Other (See Comments)    Myalgia- leg cramps   Latex Hives and Rash   Morphine And Codeine Hives    Family History  Problem Relation Age of Onset   Dementia Mother    COPD Father     Prior to Admission medications   Medication Sig Start Date End Date Taking? Authorizing Provider  b complex vitamins capsule Take 1 capsule by mouth daily.   Yes [provider]  Budeson-Glycopyrrol-Formoterol  (BREZTRI AEROSPHERE) 160-9-4.8 MCG/ACT AERO Inhale 2 puffs into the lungs daily.   Yes [provider]  Camphor-Menthol-Methyl Sal 1.2-5.7-6.3 % PTCH Apply 1 patch topically daily as needed (bilateral hip pain).   Yes [provider]  Capsicum, Cayenne, (CAYENNE PEPPER PO) Take 1 Dose by mouth every morning. Mix 1/2 teaspoon cayenne pepper with 2oz of water and drink daily.   Yes [provider]  escitalopram  (LEXAPRO ) 20 MG tablet Take 20 mg by mouth daily.   Yes [provider]  ezetimibe  (ZETIA ) 10 MG tablet Take 10 mg  by mouth daily.   Yes [provider]  ibuprofen  (ADVIL ) 200 MG tablet Take 800 mg by mouth every 8 (eight) hours as needed for mild pain (pain score 1-3), headache or cramping.   Yes [provider]  omeprazole (PRILOSEC) 20 MG capsule Take 20 mg by mouth daily.   Yes [provider]  rosuvastatin  (CRESTOR ) 40 MG tablet Take 40 mg by mouth daily. 10/23/23  Yes [provider]  tamsulosin  (FLOMAX ) 0.4 MG CAPS capsule Take 0.4 mg by mouth daily after supper.   Yes [provider]    Physical Exam: Constitutional: Moderately built.  And nourished. Vitals:    12/11/23 0400 12/11/23 0445 12/11/23 0502 12/11/23 0505  BP: (!) 153/99   (!) 149/112  Pulse: 97     Resp: 19   18  Temp:  98.3 F (36.8 C)  98.6 F (37 C)  TempSrc:  Oral  Oral  SpO2: 92%   92%  Weight:   92.8 kg   Height:       Eyes: No discharge from the ears eyes nose or mouth. ENMT: No mass felt.  No neck rigidity. Neck: No stridor. Respiratory: Mild expiratory wheeze no crepitations. Cardiovascular: S1-S2 heard. Abdomen: Soft nontender bowel sound present. Musculoskeletal: No edema. Skin: No rash. Neurologic: Alert awake oriented to time place and person.  Moves all extremities. Psychiatric: Appears normal.  Normal affect.   Labs on Admission: I have personally reviewed following labs and imaging studies  CBC: Recent Labs  Lab 12/10/23 2245  WBC 9.4  HGB 15.7  HCT 45.4  MCV 101.3*  PLT 197   Basic Metabolic Panel: Recent Labs  Lab 12/10/23 2245  NA 141  K 4.6  CL 105  CO2 22  GLUCOSE 105*  BUN 14  CREATININE 1.29*  CALCIUM  9.6   GFR: Estimated Creatinine Clearance: 56 mL/min (A) (by C-G formula based on SCr of 1.29 mg/dL (H)). Liver Function Tests: No results for input(s): AST, ALT, ALKPHOS, BILITOT, PROT, ALBUMIN in the last 168 hours. No results for input(s): LIPASE, AMYLASE in the last 168 hours. No results for input(s): AMMONIA in the last 168 hours. Coagulation Profile: No results for input(s): INR, PROTIME in the last 168 hours. Cardiac Enzymes: No results for input(s): CKTOTAL, CKMB, CKMBINDEX, TROPONINI in the last 168 hours. BNP (last 3 results) No results for input(s): PROBNP in the last 8760 hours. HbA1C: No results for input(s): HGBA1C in the last 72 hours. CBG: No results for input(s): GLUCAP in the last 168 hours. Lipid Profile: No results for input(s): CHOL, HDL, LDLCALC, TRIG, CHOLHDL, LDLDIRECT in the last 72 hours. Thyroid  Function Tests: No results for input(s): TSH,  T4TOTAL, FREET4, T3FREE, THYROIDAB in the last 72 hours. Anemia Panel: No results for input(s): VITAMINB12, FOLATE, FERRITIN, TIBC, IRON, RETICCTPCT in the last 72 hours. Urine analysis:    Component Value Date/Time   COLORURINE YELLOW 05/25/2023 0246   APPEARANCEUR CLEAR 05/25/2023 0246   LABSPEC 1.010 05/25/2023 0246   PHURINE 6.0 05/25/2023 0246   GLUCOSEU NEGATIVE 05/25/2023 0246   HGBUR NEGATIVE 05/25/2023 0246   BILIRUBINUR NEGATIVE 05/25/2023 0246   KETONESUR NEGATIVE 05/25/2023 0246   PROTEINUR NEGATIVE 05/25/2023 0246   UROBILINOGEN 0.2 03/21/2011 1438   NITRITE NEGATIVE 05/25/2023 0246   LEUKOCYTESUR NEGATIVE 05/25/2023 0246   Sepsis Labs: @LABRCNTIP (procalcitonin:4,lacticidven:4) )No results found for this or any previous visit (from the past 240 hours).   Radiological Exams on Admission: DG Chest 2 View Result Date: 12/10/2023 CLINICAL  DATA:  Chest pain EXAM: CHEST - 2 VIEW COMPARISON:  12/03/2023 FINDINGS: The heart size and mediastinal contours are within normal limits. Both lungs are clear. The visualized skeletal structures are unremarkable. IMPRESSION: No active cardiopulmonary disease. Electronically Signed   By: Norman Gatlin M.D.   On: 12/10/2023 23:30    EKG: Independently reviewed.  Sinus tachycardia.  Assessment/Plan Principal Problem:   Acute respiratory failure with hypoxia (HCC) Active Problems:   COPD GOLD III    HLD (hyperlipidemia)   Alcohol abuse   Macrocytosis   Chest pain    Acute respiratory failure with hypoxia presently on 2 L oxygen possible COPD exacerbation versus aspiration event.  Wheezing improved but still tachypneic.  Will keep patient on scheduled and as needed nebulizer and Pulmicort . Chest discomfort happened during coughing spells.  Substernal pressure-like improved with nitroglycerin  and DuoNebs.  Patient has scheduled appointment with Dr. Shlomo next week.  Will consult cardiology.  Check 2D  echo. Elevated blood pressure reading does not take any antihypertensives.  Likely contributing to patient's symptoms.  Follow-up blood pressure trends. GERD on PPI. BPH on Flomax . Hyperlipidemia on Zetia  and Crestor . Acute renal failure follow creatinine closely. Macrocytosis could be from alcohol use.  Follow CBC. Alcohol abuse on CIWA protocol.  Since patient has acute respiratory failure will need close monitoring further workup and more than 2 midnight stay.   DVT prophylaxis: Lovenox . Code Status: Full code. Family Communication: Discussed with patient's wife. Disposition Plan: Medical floor. Consults called: Cardiology. Admission status: Observation.

## 2023-12-11 NOTE — Discharge Summary (Signed)
 Physician Discharge Summary  Ralph [name] FMW:[id number] DOB: January 07, 1951 DOA: 12/10/2023  PCP: Cleotilde Planas, MD  Admit date: 12/10/2023  Discharge date: 12/11/2023  Admitted From:Home  Disposition:  Home  Recommendations for Outpatient Follow-up:  Follow up with PCP in 1-2 weeks Outpatient coronary CTA per cardiology to be scheduled Continue prednisone  for 3 days as prescribed Combivent  provided as needed and patient states that he has home nebulizer treatment as needed Continue on aspirin  and amlodipine  per cardiology recommendations Continue other home medications as prior  Home Health: None  Equipment/Devices: Yes with 2 L nasal cannula oxygen  Discharge Condition:Stable  CODE STATUS: Full  Diet recommendation: Heart Healthy  Brief/Interim Summary: Ralph [name] is a 73 y.o. male with history of COPD.  History of tobacco abuse quit more than 10 years ago, hyperlipidemia, GERD who has history of intubation in February of this year to protect airway in the setting of alcohol withdrawal presents to the ER with complaints of shortness of breath wheezing productive cough happened last evening when he was drinking a cup of water.  Patient thinks he might have aspirated.  Since then he has been wheezing and coughing.  During the same time patient also developed substernal chest pressure.  This lasted until patient came to the ER got nebulizer and nitroglycerin .  Patient states his primary care physician is planning to refer him to cardiologist and has an appointment next week with Dr. Shlomo for the shortness of breath which primary care thinks may be related to his heart.    Patient was admitted with acute hypoxemic respiratory failure along with chest discomfort and was admitted for possible COPD exacerbation.  He has done quite well and has improved faster than expected.  He was seen by cardiology and noted to have atypical chest pain that will require outpatient CTA which will  be arranged by them.  He is eager for discharge and will require some home oxygen at 2 L nasal cannula and has no further wheezing, chest pain, or shortness of breath.  He will discharge with short-term course of prednisone  and will be given some Combivent  as needed and states that he has some home breathing treatments as needed.  Discharge Diagnoses:  Principal Problem:   Acute respiratory failure with hypoxia (HCC) Active Problems:   COPD GOLD III    HLD (hyperlipidemia)   Alcohol abuse   Macrocytosis   Chest pain  Principal discharge diagnosis: Acute hypoxemic respiratory failure secondary to acute COPD exacerbation with associated atypical chest pain.  Discharge Instructions  Discharge Instructions     Diet - low sodium heart healthy   Complete by: As directed    For home use only DME oxygen   Complete by: As directed    Length of Need: Lifetime   Mode or (Route): Nasal cannula   Liters per Minute: 2   Frequency: Continuous (stationary and portable oxygen unit needed)   Oxygen conserving device: Yes   Oxygen delivery system: Gas   Increase activity slowly   Complete by: As directed       Allergies as of 12/11/2023       Reactions   Statins Other (See Comments)   Myalgia- leg cramps   Latex Hives, Rash   Morphine And Codeine Hives        Medication List     TAKE these medications    amLODipine  5 MG tablet Commonly known as: NORVASC  Take 1 tablet (5 mg total) by mouth daily. Start taking  on: December 12, 2023   aspirin  EC 81 MG tablet Take 1 tablet (81 mg total) by mouth daily. Swallow whole. Start taking on: December 12, 2023   b complex vitamins capsule Take 1 capsule by mouth daily.   Breztri Aerosphere 160-9-4.8 MCG/ACT Aero inhaler Generic drug: budesonide -glycopyrrolate -formoterol  Inhale 2 puffs into the lungs daily.   Camphor-Menthol-Methyl Sal 1.2-5.7-6.3 % Ptch Apply 1 patch topically daily as needed (bilateral hip pain).   CAYENNE PEPPER  PO Take 1 Dose by mouth every morning. Mix 1/2 teaspoon cayenne pepper with 2oz of water and drink daily.   Combivent  Respimat 20-100 MCG/ACT Aers respimat Generic drug: Ipratropium-Albuterol  Inhale 1 puff into the lungs every 6 (six) hours as needed for wheezing or shortness of breath.   escitalopram  20 MG tablet Commonly known as: LEXAPRO  Take 20 mg by mouth daily.   ezetimibe  10 MG tablet Commonly known as: ZETIA  Take 10 mg by mouth daily.   ibuprofen  200 MG tablet Commonly known as: ADVIL  Take 800 mg by mouth every 8 (eight) hours as needed for mild pain (pain score 1-3), headache or cramping.   metoprolol  tartrate 100 MG tablet Commonly known as: LOPRESSOR  Take 1 tablet (100 mg total) by mouth once for 1 dose.   omeprazole 20 MG capsule Commonly known as: PRILOSEC Take 20 mg by mouth daily.   predniSONE  10 MG tablet Commonly known as: DELTASONE  Take 4 tablets (40 mg total) by mouth daily for 3 days.   rosuvastatin  40 MG tablet Commonly known as: CRESTOR  Take 40 mg by mouth daily.   tamsulosin  0.4 MG Caps capsule Commonly known as: FLOMAX  Take 0.4 mg by mouth daily after supper.               Durable Medical Equipment  (From admission, onward)           Start     Ordered   12/11/23 0000  For home use only DME oxygen       Question Answer Comment  Length of Need Lifetime   Mode or (Route) Nasal cannula   Liters per Minute 2   Frequency Continuous (stationary and portable oxygen unit needed)   Oxygen conserving device Yes   Oxygen delivery system Gas      12/11/23 1234            Follow-up Information     Cleotilde Planas, MD. Schedule an appointment as soon as possible for a visit in 1 week(s).   Specialty: Family Medicine Contact information: [address] [place] [address] [phone]                Allergies  Allergen Reactions   Statins Other (See Comments)    Myalgia- leg cramps   Latex Hives and Rash    Morphine And Codeine Hives    Consultations: Cardiology   Procedures/Studies: DG Chest 2 View Result Date: 12/10/2023 CLINICAL DATA:  Chest pain EXAM: CHEST - 2 VIEW COMPARISON:  12/03/2023 FINDINGS: The heart size and mediastinal contours are within normal limits. Both lungs are clear. The visualized skeletal structures are unremarkable. IMPRESSION: No active cardiopulmonary disease. Electronically Signed   By: Norman Gatlin M.D.   On: 12/10/2023 23:30     Discharge Exam: Vitals:   12/11/23 0916 12/11/23 1043  BP:  (!) 160/95  Pulse:  91  Resp:  20  Temp:  98.3 F (36.8 C)  SpO2: 93% 95%   Vitals:   12/11/23 0754 12/11/23 0819 12/11/23 0916 12/11/23  1043  BP:  (!) 159/86  (!) 160/95  Pulse:  (!) 103  91  Resp: 16   20  Temp:  97.9 F (36.6 C)  98.3 F (36.8 C)  TempSrc:  Oral  Oral  SpO2: 94% 95% 93% 95%  Weight:      Height:        General: Pt is alert, awake, not in acute distress Cardiovascular: RRR, S1/S2 +, no rubs, no gallops Respiratory: CTA bilaterally, no wheezing, no rhonchi, 2 L nasal cannula Abdominal: Soft, NT, ND, bowel sounds + Extremities: no edema, no cyanosis    The results of significant diagnostics from this hospitalization (including imaging, microbiology, ancillary and laboratory) are listed below for reference.     Microbiology: No results found for this or any previous visit (from the past 240 hours).   Labs: BNP (last 3 results) Recent Labs    05/25/23 0421  BNP 48.0   Basic Metabolic Panel: Recent Labs  Lab 12/10/23 2245 12/11/23 0843  NA 141 137  K 4.6 4.6  CL 105 103  CO2 22 22  GLUCOSE 105* 292*  BUN 14 15  CREATININE 1.29* 1.29*  CALCIUM  9.6 9.6   Liver Function Tests: Recent Labs  Lab 12/11/23 0843  AST 39  ALT 38  ALKPHOS 52  BILITOT 1.0  PROT 7.1  ALBUMIN 4.1   No results for input(s): LIPASE, AMYLASE in the last 168 hours. No results for input(s): AMMONIA in the last 168 hours. CBC: Recent  Labs  Lab 12/10/23 2245 12/11/23 0843  WBC 9.4 13.8*  NEUTROABS  --  13.1*  HGB 15.7 15.3  HCT 45.4 43.5  MCV 101.3* 99.3  PLT 197 179   Cardiac Enzymes: No results for input(s): CKTOTAL, CKMB, CKMBINDEX, TROPONINI in the last 168 hours. BNP: Invalid input(s): POCBNP CBG: No results for input(s): GLUCAP in the last 168 hours. D-Dimer Recent Labs    12/11/23 0027  DDIMER <0.27   Hgb A1c No results for input(s): HGBA1C in the last 72 hours. Lipid Profile No results for input(s): CHOL, HDL, LDLCALC, TRIG, CHOLHDL, LDLDIRECT in the last 72 hours. Thyroid  function studies No results for input(s): TSH, T4TOTAL, T3FREE, THYROIDAB in the last 72 hours.  Invalid input(s): FREET3 Anemia work up No results for input(s): VITAMINB12, FOLATE, FERRITIN, TIBC, IRON, RETICCTPCT in the last 72 hours. Urinalysis    Component Value Date/Time   COLORURINE YELLOW 05/25/2023 0246   APPEARANCEUR CLEAR 05/25/2023 0246   LABSPEC 1.010 05/25/2023 0246   PHURINE 6.0 05/25/2023 0246   GLUCOSEU NEGATIVE 05/25/2023 0246   HGBUR NEGATIVE 05/25/2023 0246   BILIRUBINUR NEGATIVE 05/25/2023 0246   KETONESUR NEGATIVE 05/25/2023 0246   PROTEINUR NEGATIVE 05/25/2023 0246   UROBILINOGEN 0.2 03/21/2011 1438   NITRITE NEGATIVE 05/25/2023 0246   LEUKOCYTESUR NEGATIVE 05/25/2023 0246   Sepsis Labs Recent Labs  Lab 12/10/23 2245 12/11/23 0843  WBC 9.4 13.8*   Microbiology No results found for this or any previous visit (from the past 240 hours).   Time coordinating discharge: 35 minutes  SIGNED:   Adron JONETTA Fairly, DO [facility] 12/11/2023, 12:35 PM  If 7PM-7AM, please contact night-coverage [email or web address]

## 2023-12-11 NOTE — Progress Notes (Signed)
 The patient is admitted from ED to 3 [address]. 25. A & O x 4. Denies any acute pain. The patient is oriented to his room, ascom/bell and staff. Full assessment to epic completed. Will continue to monitor.

## 2023-12-11 NOTE — TOC Transition Note (Signed)
 Transition of Care [facility]) - Discharge Note   Patient Details  Name: Ralph [name] MRN: [id number] Date of Birth: [birth year]  Transition of [facility]) CM/SW Contact:  Waddell Barnie Rama, RN Phone Number: 12/11/2023, 1:12 PM   Clinical Narrative:    For dc today, [employer] to supply home oxygen. Wife will transport home.         Patient Goals and CMS Choice            Discharge Placement                       Discharge Plan and Services Additional resources added to the After Visit Summary for                                       Social Drivers of Health (SDOH) Interventions SDOH Screenings   Food Insecurity: Unknown (12/11/2023)  Housing: Low Risk  (12/11/2023)  Transportation Needs: No Transportation Needs (12/11/2023)  Utilities: Not At Risk (12/11/2023)  Social Connections: Unknown (12/11/2023)  Tobacco Use: Medium Risk (12/11/2023)     Readmission Risk Interventions    06/09/2023    9:53 AM  Readmission Risk Prevention Plan  Transportation Screening Complete  PCP or Specialist Appt within 5-7 Days Complete  Home Care Screening Complete  Medication Review (RN CM) Referral to Pharmacy

## 2023-12-11 NOTE — ED Provider Notes (Signed)
 Emergency Department Provider Note  TRIAGE NOTE: Pt was drinking water when he started feeling like an elephant was sitting on chest. Also having SOB. Pain is left sided and does not radiate. Does believe he has blockage in his heart. He is set up with cardiology. Pt has been having increased SOB for the last month as well  HISTORY  Chief Complaint Chest Pain   HPI Ralph [name] is a 73 y.o. male with  difficulty breathing and chest discomfort following an episode of choking on water. The patient reports a sensation of chest pressure described as feeling like a cement block, with no change in severity upon deep breathing. The patient has a history of asthma, which has worsened over the past week, leading to increased shortness of breath with minimal exertion. Despite using an inhaler, oxygen, and a nebulizer, there has been no significant relief. The patient denies any recent fevers or productive cough, aside from the incident tonight. There is no known history of heart disease, but the patient has a cardiology appointment scheduled due to recent concerns. The patient has a history of smoking, having quit approximately thirteen years ago. The patient experienced a similar episode in February, which required hospitalization and intubation, though no definitive diagnosis was made at that time. The history was obtained from both the patient and a companion.  PMH Past Medical History:  Diagnosis Date   Arthritis    hands   Asthma    GERD (gastroesophageal reflux disease)    History of kidney stones    x2  -episodes   Hyperlipidemia    Renal disorder    Varicose veins    bilateral surgeries- no problems now    Home Medications Prior to Admission medications   Medication Sig Start Date End Date Taking? Authorizing Provider  b complex vitamins capsule Take 1 capsule by mouth daily.   Yes [provider]  Budeson-Glycopyrrol-Formoterol  (BREZTRI AEROSPHERE) 160-9-4.8 MCG/ACT  AERO Inhale 2 puffs into the lungs daily.   Yes [provider]  Camphor-Menthol-Methyl Sal 1.2-5.7-6.3 % PTCH Apply 1 patch topically daily as needed (bilateral hip pain).   Yes [provider]  Capsicum, Cayenne, (CAYENNE PEPPER PO) Take 1 Dose by mouth every morning. Mix 1/2 teaspoon cayenne pepper with 2oz of water and drink daily.   Yes [provider]  escitalopram  (LEXAPRO ) 20 MG tablet Take 20 mg by mouth daily.   Yes [provider]  ezetimibe  (ZETIA ) 10 MG tablet Take 10 mg by mouth daily.   Yes [provider]  ibuprofen  (ADVIL ) 200 MG tablet Take 800 mg by mouth every 8 (eight) hours as needed for mild pain (pain score 1-3), headache or cramping.   Yes [provider]  omeprazole (PRILOSEC) 20 MG capsule Take 20 mg by mouth daily.   Yes [provider]  rosuvastatin  (CRESTOR ) 40 MG tablet Take 40 mg by mouth daily. 10/23/23  Yes [provider]  tamsulosin  (FLOMAX ) 0.4 MG CAPS capsule Take 0.4 mg by mouth daily after supper.   Yes [provider]    Social History Social History   Tobacco Use   Smoking status: Former    Current packs/day: 0.00    Average packs/day: 0.5 packs/day for 20.0 years (10.0 ttl pk-yrs)    Types: Cigarettes    Start date: 04/21/1993    Quit date: 04/21/2013    Years since quitting: 10.6   Smokeless tobacco: Never  Vaping Use   Vaping status: Never Used  Substance Use Topics   Alcohol use: Yes    Comment: social   Drug use: No    Review of Systems: Documented in HPI ____________________________________________  PHYSICAL EXAM: VITAL SIGNS: Triage: Blood pressure (!) 154/97, pulse 91, temperature 99 F (37.2 C), resp. rate 18, height 6' (1.829 m), weight 91.6 kg, SpO2 96%.  Vitals:   12/11/23 0200 12/11/23 0236 12/11/23 0400 12/11/23 0445  BP: (!) 154/97  (!) 153/99   Pulse: 91  97   Resp: 18  19   Temp:  98.5 F (36.9 C)  98.3 F (36.8 C)  TempSrc:  Oral  Oral   SpO2: 96%  92%   Weight:      Height:        Physical Exam Vitals and nursing note reviewed.  Constitutional:      Appearance: He is well-developed.  HENT:     Head: Normocephalic and atraumatic.  Cardiovascular:     Rate and Rhythm: Tachycardia present.  Pulmonary:     Effort: Pulmonary effort is normal. No respiratory distress.     Breath sounds: Decreased breath sounds and wheezing present. No rhonchi.     Comments: O2 sats 91-93 Abdominal:     General: There is no distension.  Musculoskeletal:        General: Normal range of motion.     Cervical back: Normal range of motion.     Right lower leg: No edema.     Left lower leg: No edema.  Neurological:     Mental Status: He is alert.       ____________________________________________   LABS (all labs ordered are listed, but only abnormal results are displayed)  Labs Reviewed  BASIC METABOLIC PANEL WITH GFR - Abnormal; Notable for the following components:      Result Value   Glucose, Bld 105 (*)    Creatinine, Ser 1.29 (*)    GFR, Estimated 59 (*)    All other components within normal limits  CBC - Abnormal; Notable for the following components:   MCV 101.3 (*)    MCH 35.0 (*)    All other components within normal limits  D-DIMER, QUANTITATIVE  TROPONIN I (HIGH SENSITIVITY)  TROPONIN I (HIGH SENSITIVITY)   ____________________________________________  EKG   EKG Interpretation Date/Time:  Thursday December 10 2023 22:41:42 EDT Ventricular Rate:  110 PR Interval:  136 QRS Duration:  80 QT Interval:  332 QTC Calculation: 449 R Axis:   -6  Text Interpretation: Sinus tachycardia Otherwise normal ECG When compared with ECG of 10-Dec-2023 22:31, PREVIOUS ECG IS PRESENT Confirmed by Lorette Mayo [phone]) on 12/10/2023 11:39:22 PM        ____________________________________________  RADIOLOGY  DG Chest 2 View Result Date: 12/10/2023 CLINICAL DATA:  Chest pain EXAM: CHEST - 2 VIEW COMPARISON:  12/03/2023  FINDINGS: The heart size and mediastinal contours are within normal limits. Both lungs are clear. The visualized skeletal structures are unremarkable. IMPRESSION: No active cardiopulmonary disease. Electronically Signed   By: Norman Gatlin M.D.   On: 12/10/2023 23:30   ____________________________________________  PROCEDURES  Procedure(s) performed:   .Critical Care  Performed by: Lorette Mayo, MD Authorized by: Lorette Mayo, MD   Critical care provider statement:    Critical care time (minutes):  30   Critical care was necessary to treat or prevent imminent or life-threatening deterioration of the following conditions:  Respiratory failure   Critical care was time spent personally by me on the following activities:  Development of treatment  plan with patient or surrogate, discussions with consultants, evaluation of patient's response to treatment, examination of patient, ordering and review of laboratory studies, ordering and review of radiographic studies, ordering and performing treatments and interventions, pulse oximetry, re-evaluation of patient's condition and review of old charts  ____________________________________________  INITIAL IMPRESSION / ASSESSMENT AND PLAN   Initial Evaluation:  Patient presents with worsening shortness of breath, chest discomfort, and a recent episode of choking on water, with a history of asthma and previous hospitalizations for breathing issues.  Differential Diagnosis: Differential diagnosis includes asthma exacerbation, pulmonary embolism, bronchoconstriction due to choking, ACS, Myasthenia, Laryngeal dysfunction  Plan:  Monitor symptoms and oxygen levels Conduct blood tests, including troponins in two-hour intervals for cardiac assessment Perform a respiratory function test Consider evaluation for possible blood clot due to low oxygen levels and chest pressure Await and review test results before determining if hospital admission is  necessary or discharge with follow-up  ED Course  Images ordered viewed and obtained by myself. Agree with Radiology interpretation. Details in ED course.  Labs ordered reviewed by myself as detailed in ED course.  Consultations obtained/considered detailed in ED course.   Clinical Course as of 12/11/23 0455  Fri Dec 11, 2023  0236 Normal NIF  Patient experienced increased in dyspnea - duonebs/solumedrol ordered, will reevaluate [JM]    Clinical Course User Index [JM] Timotheus Salm, Selinda, MD    Reevaluation patient still tachypneic.  Patient was ambulated on pulse ox and his oxygen dropped to 89%, he was very tachypneic and states that was panting for breath very quickly. His aereation has improved markedly however still tachypneic at rest. Suspect asthma exacerbation, will plan for [facility] consultation for admission for more steroids/breathing tx.    Cardiac Monitoring:  The patient was maintained on a cardiac monitor.  I personally viewed and interpreted the cardiac monitored which showed an underlying rhythm of: sinus tachycardia improving to sinus rhythm.  CRITICAL INTERVENTIONS:  Oxygen Duonebs Steroids  FINAL IMPRESSION Final diagnoses:  Acute respiratory failure with hypoxia [facility])     Medical screening exam was performed and I feel the patient has had appropriate emergency department evaluation and work-up for their chief complaint and is stable for ADMISSION to the hospital at this time.  I discussed with Dr. Franky with the [facility] service and discussed labs, imaging and other work-up in the emergency room.  They agree to admission for further management and work-up of said condition.  ____________________________________________   NEW OUTPATIENT MEDICATIONS STARTED DURING THIS VISIT:  New Prescriptions   No medications on file    Note:  This note was prepared with assistance of Dragon voice recognition software. Occasional wrong-word or sound-a-like substitutions may  have occurred due to the inherent limitations of voice recognition software.    Epifania Littrell, Selinda, MD 12/11/23 [phone]

## 2023-12-14 ENCOUNTER — Ambulatory Visit: Admitting: Nurse Practitioner

## 2023-12-14 ENCOUNTER — Telehealth (HOSPITAL_COMMUNITY): Payer: Self-pay | Admitting: Emergency Medicine

## 2023-12-14 NOTE — Telephone Encounter (Signed)
 Reaching out to patient to offer assistance regarding upcoming cardiac imaging study; pt verbalizes understanding of appt date/time, parking situation and where to check in, pre-test NPO status and medications ordered, and verified current allergies; name and call back number provided for further questions should they arise Rockwell Alexandria RN Navigator Cardiac Imaging [facility] [phone] office [phone] cell

## 2023-12-15 ENCOUNTER — Ambulatory Visit (HOSPITAL_COMMUNITY)
Admission: RE | Admit: 2023-12-15 | Discharge: 2023-12-15 | Disposition: A | Discharge status: Home / Self Care | Source: Ambulatory Visit | Attending: Cardiology | Admitting: Cardiology

## 2023-12-15 DIAGNOSIS — R079 Chest pain, unspecified: Secondary | ICD-10-CM | POA: Diagnosis not present

## 2023-12-15 DIAGNOSIS — I2510 Atherosclerotic heart disease of native coronary artery without angina pectoris: Secondary | ICD-10-CM | POA: Insufficient documentation

## 2023-12-15 DIAGNOSIS — I700 Atherosclerosis of aorta: Secondary | ICD-10-CM | POA: Diagnosis not present

## 2023-12-15 MED ORDER — NITROGLYCERIN 0.4 MG SL SUBL
0.8000 mg | SUBLINGUAL_TABLET | Freq: Once | SUBLINGUAL | Status: AC
  Administered 2023-12-15: 0.8 mg via SUBLINGUAL

## 2023-12-15 MED ORDER — IOHEXOL 350 MG/ML SOLN
100.0000 mL | Freq: Once | INTRAVENOUS | Status: AC | PRN
  Administered 2023-12-15: 100 mL via INTRAVENOUS

## 2023-12-16 ENCOUNTER — Ambulatory Visit: Payer: Self-pay | Admitting: Cardiology

## 2023-12-16 DIAGNOSIS — J441 Chronic obstructive pulmonary disease with (acute) exacerbation: Secondary | ICD-10-CM | POA: Diagnosis not present

## 2023-12-16 DIAGNOSIS — I2510 Atherosclerotic heart disease of native coronary artery without angina pectoris: Secondary | ICD-10-CM | POA: Diagnosis not present

## 2023-12-16 DIAGNOSIS — J9601 Acute respiratory failure with hypoxia: Secondary | ICD-10-CM | POA: Diagnosis not present

## 2023-12-16 DIAGNOSIS — R0789 Other chest pain: Secondary | ICD-10-CM | POA: Diagnosis not present

## 2023-12-16 NOTE — Telephone Encounter (Signed)
-----   Message from Thom LITTIE Sluder sent at 12/16/2023  2:19 PM EDT ----- At his convenience. Anywhere around 2-8 weeks is fine. You can put him with me, just check to see who I am with and write see sheng. Thanks   ----- Message ----- From: Randy Hamp SAILOR, RN Sent: 12/16/2023  11:36 AM EDT To: Thom LITTIE Sluder, PA-C  How soon do you want f/u? 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  Please offer him follow-up  ----- Message ----- From: Interface, Rad Results In Sent: 12/16/2023  10:45 AM EDT To: Thom LITTIE Sluder, PA-C

## 2023-12-16 NOTE — Telephone Encounter (Signed)
 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 Patient verbalizes understanding. Made f/u appt for 02/22/24.

## 2023-12-18 ENCOUNTER — Ambulatory Visit: Admitting: Nurse Practitioner

## 2024-01-13 ENCOUNTER — Ambulatory Visit (INDEPENDENT_AMBULATORY_CARE_PROVIDER_SITE_OTHER)

## 2024-01-13 VITALS — BP 112/74 | HR 80 | Ht 71.5 in | Wt 207.8 lb

## 2024-01-13 DIAGNOSIS — R9389 Abnormal findings on diagnostic imaging of other specified body structures: Secondary | ICD-10-CM

## 2024-01-13 DIAGNOSIS — J449 Chronic obstructive pulmonary disease, unspecified: Secondary | ICD-10-CM

## 2024-01-13 DIAGNOSIS — Z09 Encounter for follow-up examination after completed treatment for conditions other than malignant neoplasm: Secondary | ICD-10-CM

## 2024-01-13 DIAGNOSIS — Z720 Tobacco use: Secondary | ICD-10-CM | POA: Insufficient documentation

## 2024-01-13 DIAGNOSIS — J9811 Atelectasis: Secondary | ICD-10-CM

## 2024-01-13 DIAGNOSIS — F1021 Alcohol dependence, in remission: Secondary | ICD-10-CM

## 2024-01-13 DIAGNOSIS — R918 Other nonspecific abnormal finding of lung field: Secondary | ICD-10-CM

## 2024-01-13 DIAGNOSIS — J9611 Chronic respiratory failure with hypoxia: Secondary | ICD-10-CM

## 2024-01-13 DIAGNOSIS — Z87891 Personal history of nicotine dependence: Secondary | ICD-10-CM

## 2024-01-13 DIAGNOSIS — Z8701 Personal history of pneumonia (recurrent): Secondary | ICD-10-CM

## 2024-01-13 DIAGNOSIS — J386 Stenosis of larynx: Secondary | ICD-10-CM

## 2024-01-13 DIAGNOSIS — A310 Pulmonary mycobacterial infection: Secondary | ICD-10-CM

## 2024-01-13 DIAGNOSIS — R053 Chronic cough: Secondary | ICD-10-CM

## 2024-01-13 NOTE — Assessment & Plan Note (Addendum)
 Ct chest 11/2023 shows abnormal finding Will request a follow up ct chest in 6-8 weeks If persistent, will consider bronchoscopy  Orders:   CT Chest High Resolution; Future   Pulmonary function test; Future   Respiratory or Resp and Sputum Culture; Future

## 2024-01-13 NOTE — Progress Notes (Signed)
 Subjective:   PATIENT ID: Ralph [name] GENDER: male DOB: 1950-11-28, MRN: [id number]   HPI Ralph [name] is a 73 Y O M who is here to establish for COPD and chronic cough. He used to see Dr Ophelia in pulm clinic till 2018. Hasn't followed up since then  He has been on breztri for last few years and reports excellent compliance.  He has hx of hyperlipidemia, GERD, COPD, alcohol use disorder  He reports inpatient admission for aspiration pneumonia, intubation in 05/2023, prolonged hospitalization and outpatient therapy. He believes since the intubation, he has copious amount of productive cough daily. Denies fever, chills He has been to the hospital few more times since that admission for increased shortness of breath. He was treated with steroids, He was thought to have atypical chest pain but reports being told by 3 different cardiologist that his heart is fine. 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  It did show right middle lobe consolidation and volume loss  He has oxygen at home but only uses it for 20 min at a time, mostly before he goes to bed.  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 Drinks 4 oz alcohol daily    Past Medical History:  Diagnosis Date   Arthritis    hands   Asthma    GERD (gastroesophageal reflux disease)    History of kidney stones    x2  -episodes   Hyperlipidemia    Renal disorder    Varicose veins    bilateral surgeries- no problems now     Family History  Problem Relation Age of Onset   Dementia Mother    COPD Father      Social History   Socioeconomic History   Marital status: Significant Other    Spouse name: Not on file   Number of children: Not on file   Years of education: Not on file   Highest education level: Not on file  Occupational History   Not on file  Tobacco Use   Smoking status: Former    Current packs/day: 0.00     Average packs/day: 0.5 packs/day for 20.0 years (10.0 ttl pk-yrs)    Types: Cigarettes    Start date: 04/21/1993    Quit date: 04/21/2013    Years since quitting: 10.7   Smokeless tobacco: Never  Vaping Use   Vaping status: Never Used  Substance and Sexual Activity   Alcohol use: Yes    Comment: social   Drug use: No   Sexual activity: Not Currently  Other Topics Concern   Not on file  Social History Narrative   Not on file   Social Drivers of Health   Financial Resource Strain: Not on file  Food Insecurity: Unknown (12/11/2023)   Hunger Vital Sign    Worried About Running Out of Food in the Last Year: Never true    Ran Out of Food in the Last Year: Patient declined  Transportation Needs: No Transportation Needs (12/11/2023)   PRAPARE - Administrator, Civil Service (Medical): No    Lack of Transportation (Non-Medical): No  Physical Activity: Not on file  Stress: Not on file  Social Connections: Unknown (12/11/2023)   Social Connection and Isolation Panel    Frequency of Communication with Friends and Family: Not on file    Frequency of Social Gatherings with Friends and Family:  Not on file    Attends Religious Services: Not on file    Active Member of Clubs or Organizations: No    Attends Club or Organization Meetings: Not on file    Marital Status: Not on file  Intimate Partner Violence: Not At Risk (12/11/2023)   Humiliation, Afraid, Rape, and Kick questionnaire    Fear of Current or Ex-Partner: No    Emotionally Abused: No    Physically Abused: No    Sexually Abused: No     Allergies  Allergen Reactions   Statins Other (See Comments)    Myalgia- leg cramps   Latex Hives and Rash   Morphine And Codeine Hives     Outpatient Medications Prior to Visit  Medication Sig Dispense Refill   amLODipine  (NORVASC ) 5 MG tablet Take 1 tablet (5 mg total) by mouth daily. 30 tablet 2   aspirin  EC 81 MG tablet Take 1 tablet (81 mg total) by mouth daily. Swallow  whole. 30 tablet 12   Budeson-Glycopyrrol-Formoterol  (BREZTRI AEROSPHERE) 160-9-4.8 MCG/ACT AERO Inhale 2 puffs into the lungs daily.     Camphor-Menthol-Methyl Sal 1.2-5.7-6.3 % PTCH Apply 1 patch topically daily as needed (bilateral hip pain).     Capsicum, Cayenne, (CAYENNE PEPPER PO) Take 1 Dose by mouth every morning. Mix 1/2 teaspoon cayenne pepper with 2oz of water and drink daily.     escitalopram  (LEXAPRO ) 20 MG tablet Take 20 mg by mouth daily.     ezetimibe  (ZETIA ) 10 MG tablet Take 10 mg by mouth daily.     ibuprofen  (ADVIL ) 200 MG tablet Take 800 mg by mouth every 8 (eight) hours as needed for mild pain (pain score 1-3), headache or cramping.     omeprazole (PRILOSEC) 20 MG capsule Take 20 mg by mouth daily.     rosuvastatin  (CRESTOR ) 40 MG tablet Take 40 mg by mouth daily.     tamsulosin  (FLOMAX ) 0.4 MG CAPS capsule Take 0.4 mg by mouth daily after supper.     b complex vitamins capsule Take 1 capsule by mouth daily.     Ipratropium-Albuterol  (COMBIVENT  RESPIMAT) 20-100 MCG/ACT AERS respimat Inhale 1 puff into the lungs every 6 (six) hours as needed for wheezing or shortness of breath. 4 g 2   metoprolol  tartrate (LOPRESSOR ) 100 MG tablet Take 1 tablet (100 mg total) by mouth once for 1 dose. 1 tablet 0   No facility-administered medications prior to visit.    ROS Reviewed all systems and reported negative except as above     Objective:   Vitals:   01/13/24 1143  BP: 112/74  Pulse: 80  SpO2: 96%  Weight: 207 lb 12.8 oz (94.3 kg)  Height: 5' 11.5 (1.816 m)    Physical Exam Constitutional:      Appearance: Normal appearance.  HENT:     Head: Normocephalic.     Nose: No congestion.     Mouth/Throat:     Mouth: Mucous membranes are moist.     Pharynx: No oropharyngeal exudate.  Cardiovascular:     Rate and Rhythm: Normal rate.  Pulmonary:     Breath sounds: Rales present.  Abdominal:     General: There is distension.  Neurological:     Mental Status: He is  alert and oriented to person, place, and time.      CBC    Component Value Date/Time   WBC 13.8 (H) 12/11/2023 0843   RBC 4.38 12/11/2023 0843   HGB 15.3 12/11/2023 0843   HCT  43.5 12/11/2023 0843   PLT 179 12/11/2023 0843   MCV 99.3 12/11/2023 0843   MCH 34.9 (H) 12/11/2023 0843   MCHC 35.2 12/11/2023 0843   RDW 13.7 12/11/2023 0843   LYMPHSABS 0.4 (L) 12/11/2023 0843   MONOABS 0.2 12/11/2023 0843   EOSABS 0.0 12/11/2023 0843   BASOSABS 0.0 12/11/2023 0843     Chest imaging:  PFT:    Latest Ref Rng & Units 10/26/2013    4:04 PM  PFT Results  FVC-Pre L 3.43   FVC-Predicted Pre % 70   FVC-Post L 3.63   FVC-Predicted Post % 74   Pre FEV1/FVC % % 55   Post FEV1/FCV % % 59   FEV1-Pre L 1.89   FEV1-Predicted Pre % 51   FEV1-Post L 2.13   DLCO uncorrected ml/min/mmHg 27.33   DLCO UNC% % 81   DLVA Predicted % 96   TLC L 6.52   TLC % Predicted % 90   RV % Predicted % 109    Reviewed prior pulm notes from 2018 Reviewed d/c summary from 05/2023, 11/2023, ED notes from 08/11/2023/ 07/30/2023  Reviewed  Labs:  CT chest 02/2020 1. Stable cystic lesion in the anterior mediastinum.   Ct chest 2019 1. Unchanged appearance of fluid attenuating structure within the pre-vascular space of the anterior mediastinum compatible with a benign entity such as pericardial cyst or bronchogenic cyst. 2. Aortic Atherosclerosis (ICD10-I70.0) and Emphysema (ICD10-J43.9). 3. Hepatic steatosis. Right middle lobe consolidation or volume loss   Echo 05/2023  1. Left ventricular ejection fraction, by estimation, is 55 to 60%. The  left ventricle has normal function. The left ventricle has no regional  wall motion abnormalities. Left ventricular diastolic parameters were  normal.   2. Right ventricular systolic function is normal. The right ventricular  size is not well visualized.   3. The mitral valve is normal in structure. No evidence of mitral valve  regurgitation. No evidence of  mitral stenosis.   4. The aortic valve was not well visualized. Aortic valve regurgitation  is not visualized. No aortic stenosis is present.   5. The inferior vena cava is dilated in size with >50% respiratory  variability, suggesting right atrial pressure of 8 mmHg.   PFT 10/2013 Severe obstructive airways disease, FEV 51% Notable but not significant BD response    Assessment & Plan:   Assessment & Plan Chronic respiratory failure with hypoxia (HCC) Likely from SEVERE COPD Advised pt to buy a pulse ox and to use oxygen when saturation is below 88-90%. Discussed the benefits of LT home oxygen on copd pts Can use oxygen at night during sleep- can get overnight oximetry at a later date Schedule wlk test now Continue home oxygen Orders:   Respiratory or Resp and Sputum Culture; Future   AFB Culture & Smear; Future   6 minute walk; Future  History of smoking Discussed lung cancer screening guidelines Pt is interested Will schedule Regular CT chest as below    Hospital discharge follow-up Reviewed multiple inpatient and ED notes Hx of alcohol use disorder and aspiration pneumonia However also seems to have uncontrolled COPD. Can't rule out bronchiectasis given copious sputum production Back to baseline, however at risk for decompensation May consider swallow eval in future    COPD, severe (HCC) Continue current dose of breztri Will consider chronic azithromycin or daliresp or dupixent in the future if work up is c.w uncontrolled copd Orders:   CT Chest High Resolution; Future   Pulmonary function  test; Future   Respiratory or Resp and Sputum Culture; Future  Abnormal CT of the chest Ct chest 11/2023 shows abnormal finding Will request a follow up ct chest in 6-8 weeks If persistent, will consider bronchoscopy  Orders:   CT Chest High Resolution; Future   Pulmonary function test; Future   Respiratory or Resp and Sputum Culture; Future  Stenosis of larynx Pt concerned  for tracheal injury since intubation and reports worse symptoms since then Orders:   CT SOFT TISSUE NECK WO CONTRAST; Future  Chronic cough Multifactorial as above    Atelectasis of right lung Check sputum for Afb and gram stain Orders:   Respiratory or Resp and Sputum Culture; Future   AFB Culture & Smear; Future    Thank you for the opportunity to take part in the care of Ralph [name]    Dawne Casali Pleas, MD  Pulmonary & Critical Care Office: [phone]

## 2024-01-14 ENCOUNTER — Telehealth: Payer: Self-pay

## 2024-01-14 DIAGNOSIS — J9811 Atelectasis: Secondary | ICD-10-CM | POA: Diagnosis not present

## 2024-01-14 DIAGNOSIS — J449 Chronic obstructive pulmonary disease, unspecified: Secondary | ICD-10-CM | POA: Diagnosis not present

## 2024-01-14 DIAGNOSIS — J9611 Chronic respiratory failure with hypoxia: Secondary | ICD-10-CM | POA: Diagnosis not present

## 2024-01-14 DIAGNOSIS — R9389 Abnormal findings on diagnostic imaging of other specified body structures: Secondary | ICD-10-CM | POA: Diagnosis not present

## 2024-01-14 NOTE — Telephone Encounter (Signed)
 Per Dr Pleas- needing 6mw done before next visit. Routing me to for scheduling.

## 2024-01-17 LAB — RESPIRATORY CULTURE OR RESPIRATORY AND SPUTUM CULTURE
MICRO NUMBER:: 17016852
RESULT:: NORMAL
SPECIMEN QUALITY:: ADEQUATE

## 2024-01-18 NOTE — Telephone Encounter (Signed)
 Scheduled for 03/01/24 at 3:30 pm for - msg sent to the pt via mychart.

## 2024-02-03 ENCOUNTER — Inpatient Hospital Stay: Admission: RE | Admit: 2024-02-03 | Discharge: 2024-02-03 | Disposition: A | Source: Ambulatory Visit

## 2024-02-03 ENCOUNTER — Ambulatory Visit: Admission: RE | Admit: 2024-02-03 | Discharge: 2024-02-03 | Disposition: A | Source: Ambulatory Visit

## 2024-02-03 DIAGNOSIS — J386 Stenosis of larynx: Secondary | ICD-10-CM

## 2024-02-03 DIAGNOSIS — R9389 Abnormal findings on diagnostic imaging of other specified body structures: Secondary | ICD-10-CM

## 2024-02-03 DIAGNOSIS — J449 Chronic obstructive pulmonary disease, unspecified: Secondary | ICD-10-CM

## 2024-02-03 DIAGNOSIS — I6522 Occlusion and stenosis of left carotid artery: Secondary | ICD-10-CM | POA: Diagnosis not present

## 2024-02-03 DIAGNOSIS — R059 Cough, unspecified: Secondary | ICD-10-CM | POA: Diagnosis not present

## 2024-02-05 ENCOUNTER — Ambulatory Visit: Payer: Self-pay

## 2024-02-05 DIAGNOSIS — A310 Pulmonary mycobacterial infection: Secondary | ICD-10-CM

## 2024-02-05 DIAGNOSIS — I2510 Atherosclerotic heart disease of native coronary artery without angina pectoris: Secondary | ICD-10-CM

## 2024-02-08 NOTE — Telephone Encounter (Addendum)
-----   Message from Dipti Baral sent at 02/06/2024  2:14 PM EDT ----- No ILD or concerning nodules on CT chest. It does show Gastric wall thickening and calcifications on heart vessels.  Please find out if he has GI or cardiology doctor- if he does, we can forward  results to them. If he doesn't and if pt agreeable, please refer to GI for gastric wall thickening and cardiology for coronary calcifications    Called patient regarding result, went straight to voicemail [email or web address]

## 2024-02-08 NOTE — Telephone Encounter (Signed)
 Copied from CRM [id number]. Topic: Clinical - Lab/Test Results >> Feb 08, 2024  9:21 AM Leila BROCKS wrote: Reason for CRM: Patient [phone] is returning [facility] call, unsure of the reason of the call. Per CAL, CMA is unavailabe send a crm. Please call back.   I called and spoke to pt. I informed pt of Dr Elvin notes for the CT and pt verbalized understanding. Pt denied having either a GI or cardiologist and only agreed to seeing a cardiologist. Pt denied the referral for GI stating he did not think there was mush to do for the gastric wall thickening. I will go ahead and place a referral for cardiology and forward to Dr Pleas so she is aware.

## 2024-02-18 LAB — MYCOBACTERIA ID BY MALDI

## 2024-02-18 LAB — ACID-FAST (MYCOBACTERIA) SMEAR AND CULTURE WITH REFLEX TO IDENTIFICATION
Acid Fast Culture: POSITIVE — AB
Acid Fast Smear: NEGATIVE

## 2024-02-18 LAB — AFB IDENTIFICATION BY PCR
M avium complex: NEGATIVE
M tuberculosis complex: NEGATIVE

## 2024-02-18 LAB — ORGANISM ID BY MALDI

## 2024-02-18 NOTE — Addendum Note (Signed)
 Addended by: Aine Strycharz on: 02/18/2024 03:01 PM   Modules accepted: Orders

## 2024-02-18 NOTE — Progress Notes (Signed)
 Called patient, left voicemail. Sputum is growing mycobacteria/ Acid fast bacilli. It's an unusual organism, doesn't make patient acutely sick needing hospital admission but can cause cough and productive sputum and requires prolonged course of antibiotics for months to treat. I have ordered a repeat sputum culture to confirm this results, please advise him to come to drop sputum samples when he can. I have also placed a referral to infectious disease clinic which typically manages long course antibiotics.

## 2024-02-18 NOTE — Progress Notes (Signed)
 I called and spoke to pt. Pt informed of Dr Elvin note and pt verbalized understanding. Sputum cup left at the front desk for pt pickup and pt stated ID has already called and scheduled an appt. NFN

## 2024-02-22 ENCOUNTER — Ambulatory Visit: Attending: Cardiology | Admitting: Cardiology

## 2024-02-22 ENCOUNTER — Encounter: Payer: Self-pay | Admitting: Cardiology

## 2024-02-22 VITALS — BP 150/100 | HR 73 | Ht 71.5 in | Wt 210.6 lb

## 2024-02-22 DIAGNOSIS — R079 Chest pain, unspecified: Secondary | ICD-10-CM

## 2024-02-22 NOTE — Progress Notes (Signed)
 Cardiology Office Note:  .   Date:  02/22/2024  ID:  Ralph [name], DOB 1951-03-25, MRN [id number] PCP: Cleotilde Planas, MD  [facility] Providers Cardiologist:  Redell Shallow, MD {  History of Present Illness: .   Ralph [name] is a 73 y.o. 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     Chest pain  01/2016 reported DOE, low risk stress test  01/2016 Coronary CTA CAC score 15, with moderate nonobstructive CAD in OM 2. 05/2023 EF preserved with no significant valvular disease. 11/2023 admission, reported chest pain while coughing. Neg trops. CCTA CAC 293, minimal to mild nonobstructive CAD.  Pulmonary 05/2023 admission for aspiration pneumonia leading to intubation, has had copious amount of phlegm and with productive cough daily.    Social history  Prior smoker, quit 12 years ago.  20+ pack-year history. Daily alcohol use, 3 ounces of vodka.  Mixes this with Gwenlyn for memory. No family history of CAD. Has oxygen at home, uses sometimes nocturnally.   As a transport planner     Patient with history of nonobstructive CAD.  We had seen him recently inpatient 11/2023 with complaints of atypical chest pain while coughing with known COPD/asthma followed by pulmonology.  Coronary CTA was ordered that demonstrated minimal to mild nonobstructive CAD.  Recently saw pulmonology 12/2023 and they ordered respiratory sputum cultures.  Felt to have severe COPD.  Today patient presents for follow-up of his imaging results.  He has had no further instances of chest pain.  Shortness of breath and cough is his biggest concern.  walking up the driveway now is of significant difficulty for him whereas before he said he could run up it.  In the room has persistent cough with audible upper respiratory sounds.  Otherwise he has no other complaints today.  ROS: Denies: Chest pain, shortness of  breath, orthopnea, peripheral edema, palpitations, decreased exercise intolerance, fatigue, lightheadedness.   Studies Reviewed: .         Risk Assessment/Calculations:          Physical Exam:   VS:  BP (!) 150/100   Pulse 73   Ht 5' 11.5 (1.816 m)   Wt 210 lb 9.6 oz (95.5 kg)   SpO2 95%   BMI 28.96 kg/m    Wt Readings from Last 3 Encounters:  02/22/24 210 lb 9.6 oz (95.5 kg)  01/13/24 207 lb 12.8 oz (94.3 kg)  12/11/23 204 lb 9.6 oz (92.8 kg)    GEN: Well nourished, well developed in no acute distress NECK: No JVD; No carotid bruits CARDIAC: RRR, no murmurs, rubs, gallops RESPIRATORY:  Clear to auscultation without rales, wheezing or rhonchi  ABDOMEN: Soft, non-tender, non-distended EXTREMITIES:  No edema; No deformity   ASSESSMENT AND PLAN: .    Nonobstructive CAD Hyperlipidemia -11/2023 CCTA CAC 293, minimal to mild nonobstructive CAD. Overall stable disease, no reoccurrences of chest pain.  Chest pain likely noncardiac and related to cough and pleurisy.  Cardiac workup has been reassuring. Continue with aspirin , Zetia , rosuvastatin  40 mg.  He does report myalgias at times but at this dose is tolerable.  LDL is at 168.  Has not had any significant CV events but I think goal of 70-100 would be reasonable.  He would like to work on diet nutrition.  SOB/cough COPD Followed by pulmonology, felt to have severe disease.  Currently getting sputum cultures.  Hypertension Reports he normally  takes this at home generally well-controlled.  Blood pressure around 150/110 on repeat today.  At pulmonologist office it was closer to 110 systolics.  He will log his blood pressure for 1 week and send us  results.  Will adjust accordingly.  Also has follow-up with his PCP next week.   Dispo: 1 year follow-up with Dr. Pietro  Signed, Thom LITTIE Sluder, PA-C

## 2024-02-22 NOTE — Patient Instructions (Addendum)
 Medication Instructions:  Your physician has recommended you make the following change in your medication:   *If you need a refill on your cardiac medications before your next appointment, please call your pharmacy*  Lab Work: None If you have labs (blood work) drawn today and your tests are completely normal, you will receive your results only by: MyChart Message (if you have MyChart) OR A paper copy in the mail If you have any lab test that is abnormal or we need to change your treatment, we will call you to review the results.  Testing/Procedures: None   Follow-Up: At [facility], you and your health needs are our priority.  As part of our continuing mission to provide you with exceptional heart care, our providers are all part of one team.  This team includes your primary Cardiologist (physician) and Advanced Practice Providers or APPs (Physician Assistants and Nurse Practitioners) who all work together to provide you with the care you need, when you need it.  Your next appointment:   1 year(s)  Provider:   Redell Shallow, MD    We recommend signing up for the patient portal called MyChart.  Sign up information is provided on this After Visit Summary.  MyChart is used to connect with patients for Virtual Visits (Telemedicine).  Patients are able to view lab/test results, encounter notes, upcoming appointments, etc.  Non-urgent messages can be sent to your provider as well.   To learn more about what you can do with MyChart, go to [email or web address].   Other Instructions Check blood pressure for 1 week, can call with readings or send through MyChart.

## 2024-02-24 ENCOUNTER — Other Ambulatory Visit

## 2024-02-24 DIAGNOSIS — A310 Pulmonary mycobacterial infection: Secondary | ICD-10-CM

## 2024-02-25 ENCOUNTER — Ambulatory Visit: Payer: Self-pay

## 2024-02-25 NOTE — Telephone Encounter (Signed)
 FYI Only or Action Required?: FYI only for provider: appointment scheduled on 02/26/24.  Patient is followed in Pulmonology for COPD, last seen on 01/13/2024 by Pleas Newborn, MD.  Called Nurse Triage reporting Shortness of Breath.  Symptoms began a week ago.  Interventions attempted: Maintenance inhaler and Home oxygen use.  Symptoms are: gradually worsening.  Triage Disposition: See HCP Within 4 Hours (Or PCP Triage)  Patient/caregiver understands and will follow disposition?: Yes            Copied from CRM #[id number]. Topic: Clinical - Red Word Triage >> Feb 25, 2024  4:37 PM Celestine FALCON wrote: Red Word that prompted transfer to Nurse Triage: Pt stated he feels like he has an infection in his throat and he had an office visit with pulm provider Dr. Pleas on 01/13/2024. Pt stated he was supposed to be taking antibiotics, but he's never received any medication.  Sx: Pt is having excessive phlegm (green), increased (incredible described by pt) shortness of breath since last weekend. Reason for Disposition  [1] MILD difficulty breathing (e.g., minimal/no SOB at rest, SOB with walking, pulse < 100) AND [2] NEW-onset or WORSE than normal  Answer Assessment - Initial Assessment Questions E2C2 Pulmonary Triage - Initial Assessment Questions Chief Complaint (e.g., cough, sob, wheezing, fever, chills, sweat or additional symptoms) *Go to specific symptom protocol after initial questions. SOB with exertion, productive green cough  How long have symptoms been present? A week  Have you tested for COVID or Flu? Note: If not, ask patient if a home test can be taken. If so, instruct patient to call back for positive results. No  MEDICINES:   Have you used any OTC meds to help with symptoms? No If yes, ask What medications? N/a  Have you used your inhalers/maintenance medication? Yes If yes, What medications? Budeson-Glycopyrrol-Formoterol  (BREZTRI AEROSPHERE) - 2 puffs twice  daily Albuterol  - has not used recently Triager reviewed/reinforced albuterol  usage and SIG.    If inhaler, ask How many puffs and how often? Note: Review instructions on medication in the chart. See above  OXYGEN: Do you wear supplemental oxygen? Yes If yes, How many liters are you supposed to use? PRN 1.5L  Do you monitor your oxygen levels? Yes If yes, What is your reading (oxygen level) today? Mid-upper 90 on RA  What is your usual oxygen saturation reading?  (Note: Pulmonary O2 sats should be 90% or greater) Upper 90s          1. RESPIRATORY STATUS: Describe your breathing? (e.g., wheezing, shortness of breath, unable to speak, severe coughing)      *No Answer* 2. ONSET: When did this breathing problem begin?      *No Answer* 3. PATTERN Does the difficult breathing come and go, or has it been constant since it started?      *No Answer* 4. SEVERITY: How bad is your breathing? (e.g., mild, moderate, severe)      *No Answer* 5. RECURRENT SYMPTOM: Have you had difficulty breathing before? If Yes, ask: When was the last time? and What happened that time?      *No Answer* 6. CARDIAC HISTORY: Do you have any history of heart disease? (e.g., heart attack, angina, bypass surgery, angioplasty)      denies 7. LUNG HISTORY: Do you have any history of lung disease?  (e.g., pulmonary embolus, asthma, emphysema)     COPD 8. CAUSE: What do you think is causing the breathing problem?      flare 9. OTHER  SYMPTOMS: Do you have any other symptoms? (e.g., chest pain, cough, dizziness, fever, runny nose)     Productive cough 10. O2 SATURATION MONITOR:  Do you use an oxygen saturation monitor (pulse oximeter) at home? If Yes, ask: What is your reading (oxygen level) today? What is your usual oxygen saturation reading? (e.g., 95%)       *No Answer* 11. PREGNANCY: Is there any chance you are pregnant? When was your last menstrual period?        *No Answer* 12. TRAVEL: Have you traveled out of the country in the last month? (e.g., travel history, exposures)       *No Answer*  Protocols used: Breathing Difficulty-A-AH

## 2024-02-26 ENCOUNTER — Encounter: Payer: Self-pay | Admitting: Nurse Practitioner

## 2024-02-26 ENCOUNTER — Ambulatory Visit: Admitting: Nurse Practitioner

## 2024-02-26 VITALS — BP 138/82 | HR 91 | Temp 97.7°F | Ht 71.5 in | Wt 207.4 lb

## 2024-02-26 DIAGNOSIS — J449 Chronic obstructive pulmonary disease, unspecified: Secondary | ICD-10-CM

## 2024-02-26 DIAGNOSIS — A319 Mycobacterial infection, unspecified: Secondary | ICD-10-CM | POA: Diagnosis not present

## 2024-02-26 DIAGNOSIS — J441 Chronic obstructive pulmonary disease with (acute) exacerbation: Secondary | ICD-10-CM | POA: Diagnosis not present

## 2024-02-26 DIAGNOSIS — J9611 Chronic respiratory failure with hypoxia: Secondary | ICD-10-CM | POA: Diagnosis not present

## 2024-02-26 DIAGNOSIS — K219 Gastro-esophageal reflux disease without esophagitis: Secondary | ICD-10-CM

## 2024-02-26 DIAGNOSIS — J383 Other diseases of vocal cords: Secondary | ICD-10-CM | POA: Diagnosis not present

## 2024-02-26 MED ORDER — GUAIFENESIN ER 600 MG PO TB12: 1200.0000 mg | ORAL_TABLET | Freq: Two times a day (BID) | ORAL | 11 refills | Status: AC

## 2024-02-26 MED ORDER — ALBUTEROL SULFATE (2.5 MG/3ML) 0.083% IN NEBU: 2.5000 mg | INHALATION_SOLUTION | Freq: Four times a day (QID) | RESPIRATORY_TRACT | 11 refills | Status: AC | PRN

## 2024-02-26 MED ORDER — AMOXICILLIN-POT CLAVULANATE 875-125 MG PO TABS: 1.0000 | ORAL_TABLET | Freq: Two times a day (BID) | ORAL | 0 refills | Status: AC

## 2024-02-26 MED ORDER — SODIUM CHLORIDE 3 % IN NEBU: INHALATION_SOLUTION | Freq: Two times a day (BID) | RESPIRATORY_TRACT | 11 refills | Status: AC

## 2024-02-26 MED ORDER — PREDNISONE 10 MG PO TABS: ORAL_TABLET | ORAL | 0 refills | Status: AC

## 2024-02-26 NOTE — Patient Instructions (Addendum)
-  Continue Albuterol  inhaler 2 puffs or 3 mL neb every 6 hours as needed for shortness of breath or wheezing. Notify if symptoms persist despite rescue inhaler/neb use.  -Continue Breztri 2 puffs Twice daily. Brush tongue and rinse mouth afterwards -Ensure you are using the omeprazole (Prilosec) every day, 30 minutes prior to breakfast for the next 6 weeks to help with reflux. Will reassess use after this  -Augmentin  875 mg tablet 1 tab Twice daily for 10 days. Take with food -Prednisone  taper. 4 tabs for 2 days, then 3 tabs for 2 days, 2 tabs for 2 days, then 1 tab for 2 days, then stop. Take in AM with food  -Guaifenesin (mucinex) 1200 mg Twice daily for cough/congestion -Use your albuterol  nebulizer treatment twice daily followed by hypertonic saline 3mL Twice daily then use your flutter valve 10 times after the nebulizer treatments  Referral to ENT to evaluate you for vocal cord damage following intubation   Attend appointment with infectious disease as scheduled  Follow up in 2-3 weeks with Dr. Pleas or Ralph [name]. If symptoms do not improve or worsen, please contact office for sooner follow up or seek emergency care.

## 2024-02-26 NOTE — Assessment & Plan Note (Signed)
 Symptoms of VCD following intubation. Will send referral to ENT for direct laryngoscopy and further evaluation/management.

## 2024-02-26 NOTE — Telephone Encounter (Signed)
 Noted. Nothing further needed.

## 2024-02-26 NOTE — Assessment & Plan Note (Signed)
 Moderately severe COPD with acute exacerbation. He does have some bronchiectasis noted on recent HRCT chest upon my visualization but no ILD. Positive AFB recently; awaiting repeat and ID consultation. Will start him on empiric augmentin  and prednisone  taper. Initiate aggressive mucociliary clearance therapies. Continue triple therapy regimen. Consider biologic and/or inhaled phosphodiesterase inhibitor. Action plan in place.   Patient Instructions  -Continue Albuterol  inhaler 2 puffs or 3 mL neb every 6 hours as needed for shortness of breath or wheezing. Notify if symptoms persist despite rescue inhaler/neb use.  -Continue Breztri 2 puffs Twice daily. Brush tongue and rinse mouth afterwards -Ensure you are using the omeprazole (Prilosec) every day, 30 minutes prior to breakfast for the next 6 weeks to help with reflux. Will reassess use after this  -Augmentin  875 mg tablet 1 tab Twice daily for 10 days. Take with food -Prednisone  taper. 4 tabs for 2 days, then 3 tabs for 2 days, 2 tabs for 2 days, then 1 tab for 2 days, then stop. Take in AM with food  -Guaifenesin (mucinex) 1200 mg Twice daily for cough/congestion -Use your albuterol  nebulizer treatment twice daily followed by hypertonic saline 3mL Twice daily then use your flutter valve 10 times after the nebulizer treatments  Referral to ENT to evaluate you for vocal cord damage following intubation   Attend appointment with infectious disease as scheduled  Follow up in 2-3 weeks with Dr. Pleas or Izetta Malachy PIETY. If symptoms do not improve or worsen, please contact office for sooner follow up or seek emergency care.

## 2024-02-26 NOTE — Progress Notes (Signed)
 @Patient  ID: Ralph [name], male    DOB: 09-23-1950, 73 y.o.   MRN: [id number]  Chief Complaint  Patient presents with   Cough    Wet cough- green mucous. X1 month. Denies fever and chest pain     Referring provider: Cleotilde Planas, MD  HPI: 73 year old male, former smoker followed for COPD, chronic respiratory failure, chronic cough, non-tuberculosis mycobacterium lung infection. He is a patient of Dr. Pleas and last seen in office 01/13/2024. Past medical history significant for hx of TIA, HTN, HLD, alcohol abuse.   TEST/EVENTS:  10/26/2013 PFT: FVC 70, FEV1 51, ratio 59, TLC 90, DLCOunc 81 02/03/2024 HRCT chest: atherosclerosis. No LAD. Mild bibasilar scarring. Negative for ILd. Debris in the airway. No air trapping. Gastric wall thickening, chronic.  01/14/2024 AFB positive for mycobacterium mucogenicum/phocaicum; sputum culture negative  02/03/2024 CT neck: no laryngeal stenosis. Mild atherosclerosis. Mild cervical DDD.   01/13/2024: OV with Dr. Pleas. Hospital follow up. Used to see Dr. Darlean until 2018; hasn't followed up since. Has been on Breztri for the last few years. Excellent compliance. Inpatient admission for aspiration pna requiring intubation 05/2023 and prolonged hospitalization. Since the intubation, he's had copious amounts of productive cough daily. No fevers, chills. Few hospital admissions since then for increased SOB, treated with steroids. Has been told by cardiology his heart is fine. CADRADS 2. Prior cardiac CT showed RML consolidation and volume loss. Has oxygen at home but not using it much. Worked as a transport planner. Quit smoking 12 years ago. Drinks 4 oz alcohol daily. Sputum culture and AFB ordered. HRCT chest ordered. Continue Breztri. Consider adjunctive therapies in future. Concern for stenosis of larynx after intubation - CT neck.   02/26/2024: Today - acute Discussed the use of AI scribe software for clinical note transcription with the patient, who gave verbal  consent to proceed.  History of Present Illness Ralph [name] is a 73 year old male with COPD who presents with a productive cough and worsening respiratory symptoms.  He has experienced a productive cough with green phlegm for the past week, which has been progressively worsening. He feels more short of breath than usual and experiences wheezing. No fevers, chills, or hemoptysis. He has not been around anyone else who is sick. No significant sinus symptoms. Does have chest congestion. No chest pain. Eating and drinking normally.   He is currently using a Breztri inhaler and an albuterol  inhaler once a day. He has a nebulizer machine at home but has not used it recently. Machine is very old and the medication is expired. He also has a flutter valve but has not been using it.   He has a history of COPD and has had sputum cultures performed, with mycobacterium present. He recently submitted another sputum sample for culture two days ago. No acute infectious process on recent CT. Waiting on ID consult.   He mentions a history of intubation in February, which he associates with the onset of throat clearing and the beginning of his coughing issues. Does have some voice hoarseness. His CT of his neck was unremarkable. Has not seen ENT.   No reflux symptoms. He does have omeprazole but not taking consistently. Chronic alcohol use nightly. No dysphagia, abdominal pain, changes in bowel habits, N/V.     Allergies  Allergen Reactions   Statins Other (See Comments)    Myalgia- leg cramps   Latex Hives and Rash   Morphine And Codeine Hives    Immunization  History  Administered Date(s) Administered   INFLUENZA, HIGH DOSE SEASONAL PF 12/21/2023   PNEUMOCOCCAL CONJUGATE-20 03/05/2023   Pfizer(Comirnaty)Fall Seasonal Vaccine 12 years and older 03/05/2023   Pneumococcal Conjugate-13 12/19/2015   Pneumococcal Polysaccharide-23 11/30/2013, 07/28/2017   Tdap 11/25/2019    Past Medical History:   Diagnosis Date   Arthritis    hands   Asthma    GERD (gastroesophageal reflux disease)    History of kidney stones    x2  -episodes   Hyperlipidemia    Renal disorder    Varicose veins    bilateral surgeries- no problems now    Tobacco History: Social History   Tobacco Use  Smoking Status Former   Current packs/day: 0.00   Average packs/day: 0.5 packs/day for 20.0 years (10.0 ttl pk-yrs)   Types: Cigarettes   Start date: 04/21/1993   Quit date: 04/21/2013   Years since quitting: 10.8  Smokeless Tobacco Never   Counseling given: Not Answered   Outpatient Medications Prior to Visit  Medication Sig Dispense Refill   amLODipine  (NORVASC ) 5 MG tablet Take 1 tablet (5 mg total) by mouth daily. 30 tablet 2   aspirin  EC 81 MG tablet Take 1 tablet (81 mg total) by mouth daily. Swallow whole. 30 tablet 12   Budeson-Glycopyrrol-Formoterol  (BREZTRI AEROSPHERE) 160-9-4.8 MCG/ACT AERO Inhale 2 puffs into the lungs daily.     Camphor-Menthol-Methyl Sal 1.2-5.7-6.3 % PTCH Apply 1 patch topically daily as needed (bilateral hip pain).     Capsicum, Cayenne, (CAYENNE PEPPER PO) Take 1 Dose by mouth every morning. Mix 1/2 teaspoon cayenne pepper with 2oz of water and drink daily.     escitalopram  (LEXAPRO ) 20 MG tablet Take 20 mg by mouth daily.     ezetimibe  (ZETIA ) 10 MG tablet Take 10 mg by mouth daily.     ibuprofen  (ADVIL ) 200 MG tablet Take 800 mg by mouth every 8 (eight) hours as needed for mild pain (pain score 1-3), headache or cramping.     omeprazole (PRILOSEC) 20 MG capsule Take 20 mg by mouth daily.     rosuvastatin  (CRESTOR ) 40 MG tablet Take 40 mg by mouth daily.     tamsulosin  (FLOMAX ) 0.4 MG CAPS capsule Take 0.4 mg by mouth daily after supper.     No facility-administered medications prior to visit.     Review of Systems: as above    Physical Exam:  BP 138/82   Pulse 91   Temp 97.7 F (36.5 C)   Ht 5' 11.5 (1.816 m)   Wt 207 lb 6.4 oz (94.1 kg)   SpO2 93%  Comment: ra  BMI 28.52 kg/m   GEN: Pleasant, interactive, well-kempt; in no acute distress HEENT:  Normocephalic and atraumatic. PERRLA. Sclera white. Nasal turbinates pink, moist and patent bilaterally. No rhinorrhea present. Oropharynx pink and moist, without exudate or edema. No lesions, ulcerations, or postnasal drip. Frequent throat clearing; hoarse voice quality  NECK:  Supple w/ fair ROM. No JVD present. Thyroid  symmetrical with no goiter or nodules palpated. No lymphadenopathy.   CV: RRR, no m/r/g, no peripheral edema. Pulses intact, +2 bilaterally. No cyanosis, pallor or clubbing. PULMONARY:  Unlabored, regular breathing. Scattered rhonchi and expiratory wheezes bilaterally A&P. Congested cough. No accessory muscle use.  GI: BS present and normoactive. Soft, non-tender to palpation.  MSK: No erythema, warmth or tenderness. Cap refil <2 sec all extrem.  Neuro: A/Ox3. No focal deficits noted.   Skin: Warm, no lesions or rashe Psych: Normal affect and behavior. Judgement  and thought content appropriate.     Lab Results:  CBC    Component Value Date/Time   WBC 13.8 (H) 12/11/2023 0843   RBC 4.38 12/11/2023 0843   HGB 15.3 12/11/2023 0843   HCT 43.5 12/11/2023 0843   PLT 179 12/11/2023 0843   MCV 99.3 12/11/2023 0843   MCH 34.9 (H) 12/11/2023 0843   MCHC 35.2 12/11/2023 0843   RDW 13.7 12/11/2023 0843   LYMPHSABS 0.4 (L) 12/11/2023 0843   MONOABS 0.2 12/11/2023 0843   EOSABS 0.0 12/11/2023 0843   BASOSABS 0.0 12/11/2023 0843    BMET    Component Value Date/Time   NA 137 12/11/2023 0843   K 4.6 12/11/2023 0843   CL 103 12/11/2023 0843   CO2 22 12/11/2023 0843   GLUCOSE 292 (H) 12/11/2023 0843   BUN 15 12/11/2023 0843   CREATININE 1.29 (H) 12/11/2023 0843   CREATININE 1.30 (H) 02/12/2016 1257   CALCIUM  9.6 12/11/2023 0843   GFRNONAA 59 (L) 12/11/2023 0843   GFRAA 55 (L) 03/21/2011 1519    BNP    Component Value Date/Time   BNP 48.0 05/25/2023 0421   BNP 16.1  02/21/2016 1310     Imaging:  CT SOFT TISSUE NECK WO CONTRAST Result Date: 02/05/2024 EXAM: CT NECK WITHOUT CONTRAST 02/03/2024 11:23:20 AM TECHNIQUE: CT of the neck was performed without the administration of intravenous contrast. Multiplanar reformatted images are provided for review. Automated exposure control, iterative reconstruction, and/or weight based adjustment of the mA/kV was utilized to reduce the radiation dose to as low as reasonably achievable. COMPARISON: None available. 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 Phlegm, choking and cough after intubation 3/25. FINDINGS: AERODIGESTIVE TRACT: Tracheal caliber is normal. No irregularity or stenosis. No airway obstruction. No discrete mass. No edema. SALIVARY GLANDS: The parotid and submandibular glands are unremarkable. THYROID : Unremarkable. LYMPH NODES: No suspicious cervical lymphadenopathy. SOFT TISSUES: No mass or fluid collection. BRAIN, ORBITS, SINUSES AND MASTOIDS: No acute abnormality. LUNGS AND MEDIASTINUM: Mild calcific atherosclerosis of the aorta. No acute abnormality. BONES: Mild cervical degenerative disc disease. No focal bone abnormality. VASCULATURE: Mild calcific atherosclerosis of the left carotid bifurcation. IMPRESSION: 1. No evidence of laryngeal stenosis or airway obstruction. 2. Mild calcific atherosclerosis of the aorta and left carotid bifurcation. 3. Mild cervical degenerative disc disease. Electronically signed by: Franky Stanford MD 02/05/2024 01:30 PM EDT RP Workstation: [id number]   CT Chest High Resolution Result Date: 02/05/2024 CLINICAL DATA:  Persistent cough, filled empiric therapy. History of aspiration. EXAM: CT CHEST WITHOUT CONTRAST TECHNIQUE: Multidetector CT imaging of the chest was performed following the standard protocol without intravenous contrast. High resolution imaging of the lungs, as well as inspiratory and expiratory imaging, was  performed. RADIATION DOSE REDUCTION: This exam was performed according to the departmental dose-optimization program which includes automated exposure control, adjustment of the mA and/or kV according to patient size and/or use of iterative reconstruction technique. COMPARISON:  Cardiac CT 12/15/2023 and CT chest 03/14/2020. FINDINGS: Cardiovascular: Atherosclerotic calcification of the aorta, aortic valve and coronary arteries. Heart size normal. No pericardial effusion. Mediastinum/Nodes: No pathologically enlarged mediastinal or axillary lymph nodes. Hilar regions are difficult to definitively evaluate without IV contrast. Chronic 2.0 cm low-density lesion adjacent to the distal pulmonic trunk, possibly a lymphangioma. Bilateral gynecomastia. Esophagus is grossly unremarkable. Lungs/Pleura: Mild bibasilar scarring. Negative for subpleural reticulation, traction bronchiectasis/bronchiolectasis, ground glass, architectural distortion or honeycombing. No suspicious pulmonary nodules. Debris in the airway. No air trapping. Upper Abdomen: Gastric  wall thickening appears chronic. Visualized portions of the liver, adrenal glands, kidneys, spleen, pancreas, stomach and bowel are otherwise grossly unremarkable. No upper abdominal adenopathy. Musculoskeletal: Degenerative changes in the spine. IMPRESSION: 1. No evidence of interstitial lung disease or air trapping. 2. Chronic gastric wall thickening. 3. Aortic atherosclerosis (ICD10-I70.0). Coronary artery calcification. Electronically Signed   By: Newell Eke M.D.   On: 02/05/2024 09:00    Administration History     None          Latest Ref Rng & Units 10/26/2013    4:04 PM  PFT Results  FVC-Pre L 3.43   FVC-Predicted Pre % 70   FVC-Post L 3.63   FVC-Predicted Post % 74   Pre FEV1/FVC % % 55   Post FEV1/FCV % % 59   FEV1-Pre L 1.89   FEV1-Predicted Pre % 51   FEV1-Post L 2.13   DLCO uncorrected ml/min/mmHg 27.33   DLCO UNC% % 81   DLVA Predicted  % 96   TLC L 6.52   TLC % Predicted % 90   RV % Predicted % 109     No results found for: NITRICOXIDE      Assessment & Plan:   COPD with acute exacerbation (HCC) Moderately severe COPD with acute exacerbation. He does have some bronchiectasis noted on recent HRCT chest upon my visualization but no ILD. Positive AFB recently; awaiting repeat and ID consultation. Will start him on empiric augmentin  and prednisone  taper. Initiate aggressive mucociliary clearance therapies. Continue triple therapy regimen. Consider biologic and/or inhaled phosphodiesterase inhibitor. Action plan in place.   Patient Instructions  -Continue Albuterol  inhaler 2 puffs or 3 mL neb every 6 hours as needed for shortness of breath or wheezing. Notify if symptoms persist despite rescue inhaler/neb use.  -Continue Breztri 2 puffs Twice daily. Brush tongue and rinse mouth afterwards -Ensure you are using the omeprazole (Prilosec) every day, 30 minutes prior to breakfast for the next 6 weeks to help with reflux. Will reassess use after this  -Augmentin  875 mg tablet 1 tab Twice daily for 10 days. Take with food -Prednisone  taper. 4 tabs for 2 days, then 3 tabs for 2 days, 2 tabs for 2 days, then 1 tab for 2 days, then stop. Take in AM with food  -Guaifenesin (mucinex) 1200 mg Twice daily for cough/congestion -Use your albuterol  nebulizer treatment twice daily followed by hypertonic saline 3mL Twice daily then use your flutter valve 10 times after the nebulizer treatments  Referral to ENT to evaluate you for vocal cord damage following intubation   Attend appointment with infectious disease as scheduled  Follow up in 2-3 weeks with Dr. Pleas or Izetta Malachy PIETY. If symptoms do not improve or worsen, please contact office for sooner follow up or seek emergency care.    Chronic respiratory failure with hypoxia (HCC) No hypoxia on room air today. Continue to monitor at home for goal >88-90%. Recommend ONO on room air  at follow up to evaluate for nocturnal hypoxia.   Mycobacterium infection, non-TB See above  Vocal cord dysfunction Symptoms of VCD following intubation. Will send referral to ENT for direct laryngoscopy and further evaluation/management.   GERD (gastroesophageal reflux disease) Concern for silent GERD in setting of chronic alcohol use with hx of aspiration pna. Possible contributor to cough and upper airway symptoms. Advise to start PPI therapy and reassess response   Advised if symptoms do not improve or worsen, to please contact office for sooner follow up or seek emergency  care.   I spent 45 minutes of dedicated to the care of this patient on the date of this encounter to include pre-visit review of records, face-to-face time with the patient discussing conditions above, post visit ordering of testing, clinical documentation with the electronic health record, making appropriate referrals as documented, and communicating necessary findings to members of the patients care team.  Comer LULLA Rouleau, NP 02/26/2024  Pt aware and understands NP's role.

## 2024-02-26 NOTE — Assessment & Plan Note (Signed)
 No hypoxia on room air today. Continue to monitor at home for goal >88-90%. Recommend ONO on room air at follow up to evaluate for nocturnal hypoxia.

## 2024-02-26 NOTE — Assessment & Plan Note (Signed)
 Concern for silent GERD in setting of chronic alcohol use with hx of aspiration pna. Possible contributor to cough and upper airway symptoms. Advise to start PPI therapy and reassess response

## 2024-02-26 NOTE — Assessment & Plan Note (Signed)
 See above

## 2024-02-29 ENCOUNTER — Ambulatory Visit: Admitting: Internal Medicine

## 2024-02-29 ENCOUNTER — Other Ambulatory Visit: Payer: Self-pay

## 2024-02-29 ENCOUNTER — Encounter: Payer: Self-pay | Admitting: Internal Medicine

## 2024-02-29 VITALS — Wt 206.0 lb

## 2024-02-29 DIAGNOSIS — A319 Mycobacterial infection, unspecified: Secondary | ICD-10-CM

## 2024-02-29 NOTE — Progress Notes (Signed)
 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   Vocal cord dysfunction 02/26/2024   GERD (gastroesophageal reflux disease) 02/26/2024   Tobacco abuse 01/13/2024   Chronic respiratory failure with hypoxia (HCC) 12/11/2023   HLD (hyperlipidemia) 12/11/2023   Alcohol abuse 12/11/2023   Macrocytosis 12/11/2023   Chest pain 12/11/2023   TIA (transient ischemic attack) 08/11/2023   Malnutrition of moderate degree 05/30/2023   Respiratory arrest (HCC) 05/25/2023   Abnormal CT of the chest 09/14/2016   COPD with acute exacerbation (HCC) 09/12/2016   DOE (dyspnea on exertion) 01/18/2016   Excessive daytime sleepiness 01/18/2016   Essential hypertension 01/18/2016     Subjective:  Ralph [name] is a 73 y.o. arthritis of hands, asthma, GERD, upper lipidemia, varicose veins referred by pulmonology Dr. Shelda Helling for NTM infection.  Patient is followed by pulmonology last seen on 9/24 previous to that seen in 2018.  He has been followed for COPD and chronic cough.  He was intubated in February 2025 for an admission for aspiration pneumonia and believes that he has had increased amount of productive cough since intubation.  Quit smoking about 12 years ago.  Referred to infectious disease as sputum cultures grew Mycobacterium mucogenicum/phocaicum. I reviewed your CT chest from 10/15 which showed no evidence of interstitial lung disease or air trapping, chronic gastric wall thickening. Today: Notes  cough is hte worst around 10 am and then late in the evemning gets another bout of coughing. States he has light greeen phlegm  high in quantity. No fever hcills, no weighloss, no night seats. He is on room air. 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 [place]ignially [place] .  No arrmed service experience, no incarceration hx, has not  left the country.  Review of Systems  All other systems reviewed and are negative.   Past Medical History:  Diagnosis Date   Arthritis    hands   Asthma    GERD (gastroesophageal reflux disease)    History of kidney stones    x2  -episodes   Hyperlipidemia    Renal disorder    Varicose veins    bilateral surgeries- no problems now    Outpatient Medications Prior to Visit  Medication Sig Dispense Refill   albuterol  (PROVENTIL ) (2.5 MG/3ML) 0.083% nebulizer solution Take 3 mLs (2.5 mg total) by nebulization every 6 (six) hours as needed for wheezing or shortness of breath. 75 mL 11   amLODipine  (NORVASC ) 5 MG tablet Take 1 tablet (5 mg total) by mouth daily. 30 tablet 2   amoxicillin -clavulanate (AUGMENTIN ) 875-125 MG tablet Take 1 tablet by mouth 2 (two) times daily for 10 days. 20 tablet 0   aspirin  EC 81 MG tablet Take 1 tablet (81 mg total) by mouth daily. Swallow whole. 30 tablet 12   Budeson-Glycopyrrol-Formoterol  (BREZTRI AEROSPHERE) 160-9-4.8 MCG/ACT AERO Inhale 2 puffs into the lungs daily.     Camphor-Menthol-Methyl Sal 1.2-5.7-6.3 % PTCH Apply 1 patch topically daily as needed (bilateral hip pain).     Capsicum, Cayenne, (CAYENNE PEPPER PO) Take 1 Dose by mouth every morning. Mix 1/2 teaspoon cayenne pepper with 2oz of water and drink daily.     escitalopram  (LEXAPRO ) 20 MG tablet Take 20 mg by mouth daily.     ezetimibe  (ZETIA ) 10 MG tablet Take 10 mg by mouth daily.  guaiFENesin (MUCINEX) 600 MG 12 hr tablet Take 2 tablets (1,200 mg total) by mouth 2 (two) times daily. 120 tablet 11   ibuprofen  (ADVIL ) 200 MG tablet Take 800 mg by mouth every 8 (eight) hours as needed for mild pain (pain score 1-3), headache or cramping.     omeprazole (PRILOSEC) 20 MG capsule Take 20 mg by mouth daily.     predniSONE  (DELTASONE ) 10 MG tablet 4 tabs for 2 days, then 3 tabs for 2 days, 2 tabs for 2 days, then 1 tab for 2 days, then stop 20 tablet 0   rosuvastatin  (CRESTOR ) 40 MG tablet  Take 40 mg by mouth daily.     sodium chloride  HYPERTONIC 3 % nebulizer solution Take by nebulization in the morning and at bedtime. 3 mL each treatment 540 mL 11   tamsulosin  (FLOMAX ) 0.4 MG CAPS capsule Take 0.4 mg by mouth daily after supper.     No facility-administered medications prior to visit.     Allergies  Allergen Reactions   Statins Other (See Comments)    Myalgia- leg cramps   Latex Hives and Rash   Morphine And Codeine Hives    Social History   Tobacco Use   Smoking status: Former    Current packs/day: 0.00    Average packs/day: 0.5 packs/day for 20.0 years (10.0 ttl pk-yrs)    Types: Cigarettes    Start date: 04/21/1993    Quit date: 04/21/2013    Years since quitting: 10.8   Smokeless tobacco: Never  Vaping Use   Vaping status: Never Used  Substance Use Topics   Alcohol use: Yes    Comment: social   Drug use: No    Family History  Problem Relation Age of Onset   Dementia Mother    COPD Father     Objective:  There were no vitals filed for this visit. There is no height or weight on file to calculate BMI.  Physical Exam Constitutional:      General: He is not in acute distress.    Appearance: He is normal weight. He is not toxic-appearing.  HENT:     Head: Normocephalic and atraumatic.     Right Ear: External ear normal.     Left Ear: External ear normal.     Nose: No congestion or rhinorrhea.     Mouth/Throat:     Mouth: Mucous membranes are moist.     Pharynx: Oropharynx is clear.  Eyes:     Extraocular Movements: Extraocular movements intact.     Conjunctiva/sclera: Conjunctivae normal.     Pupils: Pupils are equal, round, and reactive to light.  Cardiovascular:     Rate and Rhythm: Normal rate and regular rhythm.     Heart sounds: No murmur heard.    No friction rub. No gallop.  Pulmonary:     Effort: Pulmonary effort is normal.     Breath sounds: Normal breath sounds.  Abdominal:     General: Abdomen is flat. Bowel sounds are normal.      Palpations: Abdomen is soft.  Musculoskeletal:        General: No swelling. Normal range of motion.     Cervical back: Normal range of motion and neck supple.  Skin:    General: Skin is warm and dry.  Neurological:     General: No focal deficit present.     Mental Status: He is oriented to person, place, and time.  Psychiatric:  Mood and Affect: Mood normal.     Lab Results: Lab Results  Component Value Date   WBC 13.8 (H) 12/11/2023   HGB 15.3 12/11/2023   HCT 43.5 12/11/2023   MCV 99.3 12/11/2023   PLT 179 12/11/2023    Lab Results  Component Value Date   CREATININE 1.29 (H) 12/11/2023   BUN 15 12/11/2023   NA 137 12/11/2023   K 4.6 12/11/2023   CL 103 12/11/2023   CO2 22 12/11/2023    Lab Results  Component Value Date   ALT 38 12/11/2023   AST 39 12/11/2023   ALKPHOS 52 12/11/2023   BILITOT 1.0 12/11/2023     Assessment & Plan:  73 year old male followed by pulmonology for chronic cough and COPD sent to infectious disease due to sputum cultures growing NTM #AFB+ for NTM - He was seen by pulmonology 9/24 for COPD and chronic cough prior to that seen 2018.  In the interim he had a hospitalization of aspirin  show pneumonia requiring intubation and then has felt that he has had copious cough with sputum production.  As such cultures were obtained. 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  CT chest is negative for pulmonary/cavitation/bronchiectasis.  Continue to follow with pulmonology, no treatment needed at this time - Follow-up with ID as needed   Loney Stank, MD [facility]   02/29/24  12:38 PM I have personally spent 62 minutes involved in face-to-face and non-face-to-face activities  for this patient on the day of the visit. Professional time spent includes the following activities: Preparing to see the patient (review of tests), Obtaining and/or reviewing separately obtained history (admission/discharge record), Performing a medically appropriate examination and/or evaluation , Ordering medications/tests/procedures, referring and communicating with other health care professionals, Documenting clinical information in the EMR, Independently interpreting results (not separately reported), Communicating results to the patient/family/caregiver, Counseling and educating the patient/family/caregiver and Care coordination (not separately reported).

## 2024-03-01 ENCOUNTER — Ambulatory Visit

## 2024-03-01 ENCOUNTER — Ambulatory Visit (INDEPENDENT_AMBULATORY_CARE_PROVIDER_SITE_OTHER): Admitting: *Deleted

## 2024-03-01 ENCOUNTER — Other Ambulatory Visit

## 2024-03-01 DIAGNOSIS — R9389 Abnormal findings on diagnostic imaging of other specified body structures: Secondary | ICD-10-CM

## 2024-03-01 DIAGNOSIS — J449 Chronic obstructive pulmonary disease, unspecified: Secondary | ICD-10-CM | POA: Diagnosis not present

## 2024-03-01 DIAGNOSIS — Z720 Tobacco use: Secondary | ICD-10-CM | POA: Diagnosis not present

## 2024-03-01 LAB — PULMONARY FUNCTION TEST
DL/VA % pred: 97 %
DL/VA: 3.88 ml/min/mmHg/L
DLCO cor % pred: 69 %
DLCO cor: 18.17 ml/min/mmHg
DLCO unc % pred: 69 %
DLCO unc: 18.17 ml/min/mmHg
FEF 25-75 Post: 0.97 L/s
FEF 25-75 Pre: 0.42 L/s
FEF2575-%Change-Post: 130 %
FEF2575-%Pred-Post: 40 %
FEF2575-%Pred-Pre: 17 %
FEV1-%Change-Post: 36 %
FEV1-%Pred-Post: 44 %
FEV1-%Pred-Pre: 32 %
FEV1-Post: 1.45 L
FEV1-Pre: 1.06 L
FEV1FVC-%Change-Post: 7 %
FEV1FVC-%Pred-Pre: 65 %
FEV6-%Change-Post: 28 %
FEV6-%Pred-Post: 64 %
FEV6-%Pred-Pre: 50 %
FEV6-Post: 2.73 L
FEV6-Pre: 2.12 L
FEV6FVC-%Change-Post: 0 %
FEV6FVC-%Pred-Post: 103 %
FEV6FVC-%Pred-Pre: 102 %
FVC-%Change-Post: 27 %
FVC-%Pred-Post: 62 %
FVC-%Pred-Pre: 49 %
FVC-Post: 2.8 L
FVC-Pre: 2.2 L
Post FEV1/FVC ratio: 52 %
Post FEV6/FVC ratio: 97 %
Pre FEV1/FVC ratio: 48 %
Pre FEV6/FVC Ratio: 96 %
RV % pred: 196 %
RV: 5.04 L
TLC % pred: 118 %
TLC: 8.58 L

## 2024-03-01 NOTE — Progress Notes (Signed)
 Full PFT performed today.

## 2024-03-01 NOTE — Progress Notes (Unsigned)
 Six Minute Walk - 03/01/24 1628       Six Minute Walk   Medications taken before test (dose and time) Augmentin  875-125mg , Breztri aerosphere 160-9-4.8 mcg/act, Capsicum, Escitalopram  oxalate 20 mg, Ezetimibe  10 mg, Omeprazole 20 mg, Rosuvastatin  Calcium  40 mg    Supplemental oxygen during test? No    Lap distance in meters  34 meters    Laps Completed 24    Partial lap (in meters) 0 meters    Baseline BP (sitting) 163/102    Baseline Heartrate 70    Baseline Dyspnea (Borg Scale) 3    Baseline Fatigue (Borg Scale) 2    Baseline SPO2 94 %      End of Test Values    BP (sitting) 172/116    Heartrate 91    Dyspnea (Borg Scale) 3    Fatigue (Borg Scale) 2    SPO2 93 %      2 Minutes Post Walk Values   BP (sitting) 163/101    Heartrate 73    SPO2 95 %    Stopped or paused before six minutes? No      Interpretation   Distance completed 816 meters    Tech Comments: Patient walked at an average pace, no stops for rest/fatigue.  Tolerated walk well.

## 2024-03-01 NOTE — Patient Instructions (Signed)
 Full PFT performed today.

## 2024-03-02 ENCOUNTER — Ambulatory Visit (INDEPENDENT_AMBULATORY_CARE_PROVIDER_SITE_OTHER)

## 2024-03-02 VITALS — BP 136/76 | HR 91 | Temp 98.1°F | Ht 71.5 in | Wt 209.2 lb

## 2024-03-02 DIAGNOSIS — J9611 Chronic respiratory failure with hypoxia: Secondary | ICD-10-CM

## 2024-03-02 DIAGNOSIS — J42 Unspecified chronic bronchitis: Secondary | ICD-10-CM | POA: Diagnosis not present

## 2024-03-02 DIAGNOSIS — J449 Chronic obstructive pulmonary disease, unspecified: Secondary | ICD-10-CM

## 2024-03-02 DIAGNOSIS — J4489 Other specified chronic obstructive pulmonary disease: Secondary | ICD-10-CM

## 2024-03-02 DIAGNOSIS — R845 Abnormal microbiological findings in specimens from respiratory organs and thorax: Secondary | ICD-10-CM

## 2024-03-02 NOTE — Patient Instructions (Signed)
 It was a pleasure to see you today. We will call you once we have the sputum results back. Call us  if your cough or sputum production worsens

## 2024-03-02 NOTE — Progress Notes (Signed)
 Subjective:   PATIENT ID: Ralph [name] GENDER: male DOB: [birth year], MRN: [id number]   HPI from 12/2023 Mr Pember is a 73 Y O M who is here to establish for COPD and chronic cough. He used to see Dr Ophelia in pulm clinic till 2018. Hasn't followed up since then  He has been on breztri for last few years and reports excellent compliance.  He has hx of hyperlipidemia, GERD, COPD, alcohol use disorder  He reports inpatient admission for aspiration pneumonia, intubation in 05/2023, prolonged hospitalization and outpatient therapy. He believes since the intubation, he has copious amount of productive cough daily. Denies fever, chills He has been to the hospital few more times since that admission for increased shortness of breath. He was treated with steroids, He was thought to have atypical chest pain but reports being told by 3 different cardiologist that his heart is fine. 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  It did show right middle lobe consolidation and volume loss  He has oxygen at home but only uses it for 20 min at a time, mostly before he goes to bed.  He quit smoking 12 years ago He used to work as a transport planner Lives with his wife Drinks 4 oz alcohol daily  Interval hx 02/2024: Had sick visit 5 days ago with NP Cobb in this clinic, was treated with augmentin  and prednisone . He reports his symptoms have improved since then  He continues to have cough in the morning Uses oxygen and reports benefit  Completed PFT and walk test and here to review it Uses breztri and prn albuterol  and antihistaminics  Referral to ENT placed in last visit.  Sputum ordered in last visit was growing mycobacteria/ Acid fast bacilli. Mycobacterium mucogenicum/phocaicum  ID referral placed Due to low CFU and no infiltrates on CT chest, treatment was not suggested.   Past Medical History:  Diagnosis Date   Arthritis    hands    Asthma    GERD (gastroesophageal reflux disease)    History of kidney stones    x2  -episodes   Hyperlipidemia    Renal disorder    Varicose veins    bilateral surgeries- no problems now     Family History  Problem Relation Age of Onset   Dementia Mother    COPD Father      Social History   Socioeconomic History   Marital status: Significant Other    Spouse name: Not on file   Number of children: Not on file   Years of education: Not on file   Highest education level: Not on file  Occupational History   Not on file  Tobacco Use   Smoking status: Former    Current packs/day: 0.00    Average packs/day: 0.5 packs/day for 20.0 years (10.0 ttl pk-yrs)    Types: Cigarettes    Start date: 04/21/1993    Quit date: 04/21/2013    Years since quitting: 10.8   Smokeless tobacco: Never  Vaping Use   Vaping status: Never Used  Substance and Sexual Activity   Alcohol use: Yes    Comment: social   Drug use: No   Sexual activity: Not Currently  Other Topics Concern   Not on file  Social History Narrative   Not on file   Social Drivers of Health   Financial Resource Strain: Not on file  Food Insecurity: Unknown (12/11/2023)   Hunger Vital Sign  Worried About Programme Researcher, Broadcasting/film/video in the Last Year: Never true    Ran Out of Food in the Last Year: Patient declined  Transportation Needs: No Transportation Needs (12/11/2023)   PRAPARE - Administrator, Civil Service (Medical): No    Lack of Transportation (Non-Medical): No  Physical Activity: Not on file  Stress: Not on file  Social Connections: Unknown (12/11/2023)   Social Connection and Isolation Panel    Frequency of Communication with Friends and Family: Not on file    Frequency of Social Gatherings with Friends and Family: Not on file    Attends Religious Services: Not on file    Active Member of Clubs or Organizations: No    Attends Banker Meetings: Not on file    Marital Status: Not on file   Intimate Partner Violence: Not At Risk (12/11/2023)   Humiliation, Afraid, Rape, and Kick questionnaire    Fear of Current or Ex-Partner: No    Emotionally Abused: No    Physically Abused: No    Sexually Abused: No     Allergies  Allergen Reactions   Statins Other (See Comments)    Myalgia- leg cramps   Latex Hives and Rash   Morphine And Codeine Hives     Outpatient Medications Prior to Visit  Medication Sig Dispense Refill   albuterol  (PROVENTIL ) (2.5 MG/3ML) 0.083% nebulizer solution Take 3 mLs (2.5 mg total) by nebulization every 6 (six) hours as needed for wheezing or shortness of breath. 75 mL 11   amLODipine  (NORVASC ) 5 MG tablet Take 1 tablet (5 mg total) by mouth daily. 30 tablet 2   amoxicillin -clavulanate (AUGMENTIN ) 875-125 MG tablet Take 1 tablet by mouth 2 (two) times daily for 10 days. 20 tablet 0   aspirin  EC 81 MG tablet Take 1 tablet (81 mg total) by mouth daily. Swallow whole. 30 tablet 12   Budeson-Glycopyrrol-Formoterol  (BREZTRI AEROSPHERE) 160-9-4.8 MCG/ACT AERO Inhale 2 puffs into the lungs daily.     Camphor-Menthol-Methyl Sal 1.2-5.7-6.3 % PTCH Apply 1 patch topically daily as needed (bilateral hip pain).     Capsicum, Cayenne, (CAYENNE PEPPER PO) Take 1 Dose by mouth every morning. Mix 1/2 teaspoon cayenne pepper with 2oz of water and drink daily.     escitalopram  (LEXAPRO ) 20 MG tablet Take 20 mg by mouth daily.     ezetimibe  (ZETIA ) 10 MG tablet Take 10 mg by mouth daily.     guaiFENesin (MUCINEX) 600 MG 12 hr tablet Take 2 tablets (1,200 mg total) by mouth 2 (two) times daily. 120 tablet 11   ibuprofen  (ADVIL ) 200 MG tablet Take 800 mg by mouth every 8 (eight) hours as needed for mild pain (pain score 1-3), headache or cramping.     omeprazole (PRILOSEC) 20 MG capsule Take 20 mg by mouth daily.     predniSONE  (DELTASONE ) 10 MG tablet 4 tabs for 2 days, then 3 tabs for 2 days, 2 tabs for 2 days, then 1 tab for 2 days, then stop 20 tablet 0   rosuvastatin   (CRESTOR ) 40 MG tablet Take 40 mg by mouth daily.     sodium chloride  HYPERTONIC 3 % nebulizer solution Take by nebulization in the morning and at bedtime. 3 mL each treatment 540 mL 11   tamsulosin  (FLOMAX ) 0.4 MG CAPS capsule Take 0.4 mg by mouth daily after supper.     No facility-administered medications prior to visit.    ROS Reviewed all systems and reported negative except as  above     Objective:   Vitals:   03/02/24 1328  BP: 136/76  Pulse: 91  Temp: 98.1 F (36.7 C)  TempSrc: Oral  SpO2: 95%  Weight: 209 lb 3.2 oz (94.9 kg)  Height: 5' 11.5 (1.816 m)    Physical Exam Constitutional:      Appearance: Normal appearance.  HENT:     Head: Normocephalic.     Nose: No congestion.     Mouth/Throat:     Mouth: Mucous membranes are moist.     Pharynx: No oropharyngeal exudate.  Cardiovascular:     Rate and Rhythm: Normal rate.  Pulmonary:     Breath sounds: Rales present.  Abdominal:     General: There is no distension.  Neurological:     Mental Status: He is alert and oriented to person, place, and time.      CBC    Component Value Date/Time   WBC 13.8 (H) 12/11/2023 0843   RBC 4.38 12/11/2023 0843   HGB 15.3 12/11/2023 0843   HCT 43.5 12/11/2023 0843   PLT 179 12/11/2023 0843   MCV 99.3 12/11/2023 0843   MCH 34.9 (H) 12/11/2023 0843   MCHC 35.2 12/11/2023 0843   RDW 13.7 12/11/2023 0843   LYMPHSABS 0.4 (L) 12/11/2023 0843   MONOABS 0.2 12/11/2023 0843   EOSABS 0.0 12/11/2023 0843   BASOSABS 0.0 12/11/2023 0843     Chest imaging:  PFT:    Latest Ref Rng & Units 03/01/2024    3:42 PM 10/26/2013    4:04 PM  PFT Results  FVC-Pre L 2.20  3.43   FVC-Predicted Pre % 49  70   FVC-Post L 2.80  3.63   FVC-Predicted Post % 62  74   Pre FEV1/FVC % % 48  55   Post FEV1/FCV % % 52  59   FEV1-Pre L 1.06  1.89   FEV1-Predicted Pre % 32  51   FEV1-Post L 1.45  2.13   DLCO uncorrected ml/min/mmHg 18.17  27.33   DLCO UNC% % 69  81   DLCO corrected  ml/min/mmHg 18.17    DLCO COR %Predicted % 69    DLVA Predicted % 97  96   TLC L 8.58  6.52   TLC % Predicted % 118  90   RV % Predicted % 196  109   36% change with bronchodilator  Ct chest 01/2024 No ILD or concerning nodules on CT chest. It does show Gastric wall thickening and calcifications on heart vessels.  Mild bronchiectasis   Reviewed ID and pulm notes from Nov 2025  Reviewed prior pulm notes from 2018 Reviewed d/c summary from 05/2023, 11/2023, ED notes from 08/11/2023/ 07/30/2023  Reviewed  Labs:  CT chest 02/2020 1. Stable cystic lesion in the anterior mediastinum.   Ct chest 2019 1. Unchanged appearance of fluid attenuating structure within the pre-vascular space of the anterior mediastinum compatible with a benign entity such as pericardial cyst or bronchogenic cyst. 2. Aortic Atherosclerosis (ICD10-I70.0) and Emphysema (ICD10-J43.9). 3. Hepatic steatosis. Right middle lobe consolidation or volume loss   Echo 05/2023  1. Left ventricular ejection fraction, by estimation, is 55 to 60%. The  left ventricle has normal function. The left ventricle has no regional  wall motion abnormalities. Left ventricular diastolic parameters were  normal.   2. Right ventricular systolic function is normal. The right ventricular  size is not well visualized.   3. The mitral valve is normal in structure. No evidence of  mitral valve  regurgitation. No evidence of mitral stenosis.   4. The aortic valve was not well visualized. Aortic valve regurgitation  is not visualized. No aortic stenosis is present.   5. The inferior vena cava is dilated in size with >50% respiratory  variability, suggesting right atrial pressure of 8 mmHg.   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 Mycobacterium mucogenicum/phocaicum  ID referral placed Due to low CFU and no infiltrates on CT chest, treatment was not suggested. Because of ongoing cough and bronchiectasis, will repeat sputum AFB gram stain and culture    Asthma-COPD overlap syndrome (HCC) Based on PFT Continue breztri      Thank you for the opportunity to take part in the care of Ralph [name]     Keriana Sarsfield Pleas, MD  Pulmonary & Critical Care Office: [phone]

## 2024-03-03 NOTE — Assessment & Plan Note (Signed)
 Doesn't need oxygen based on recent Walked over 819m, lowest sat 93% Pt uses as needed at home, especially in the morning

## 2024-03-08 DIAGNOSIS — Z0000 Encounter for general adult medical examination without abnormal findings: Secondary | ICD-10-CM | POA: Diagnosis not present

## 2024-03-16 ENCOUNTER — Encounter (INDEPENDENT_AMBULATORY_CARE_PROVIDER_SITE_OTHER): Payer: Self-pay

## 2024-03-29 DIAGNOSIS — R454 Irritability and anger: Secondary | ICD-10-CM | POA: Diagnosis not present

## 2024-03-29 DIAGNOSIS — J449 Chronic obstructive pulmonary disease, unspecified: Secondary | ICD-10-CM | POA: Diagnosis not present

## 2024-03-29 DIAGNOSIS — D7589 Other specified diseases of blood and blood-forming organs: Secondary | ICD-10-CM | POA: Diagnosis not present

## 2024-03-29 DIAGNOSIS — I10 Essential (primary) hypertension: Secondary | ICD-10-CM | POA: Diagnosis not present

## 2024-03-29 DIAGNOSIS — E7800 Pure hypercholesterolemia, unspecified: Secondary | ICD-10-CM | POA: Diagnosis not present

## 2024-04-05 DIAGNOSIS — K080 Exfoliation of teeth due to systemic causes: Secondary | ICD-10-CM | POA: Diagnosis not present

## 2024-04-21 LAB — ACID-FAST (MYCOBACTERIA) SMEAR AND CULTURE WITH REFLEX TO IDENTIFICATION
Acid Fast Culture: NEGATIVE
Acid Fast Smear: NEGATIVE

## 2024-05-17 ENCOUNTER — Emergency Department (HOSPITAL_COMMUNITY)

## 2024-05-17 ENCOUNTER — Other Ambulatory Visit: Payer: Self-pay

## 2024-05-17 ENCOUNTER — Inpatient Hospital Stay (HOSPITAL_COMMUNITY)
Admission: EM | Admit: 2024-05-17 | Source: Home / Self Care | Attending: Internal Medicine | Admitting: Internal Medicine

## 2024-05-17 ENCOUNTER — Encounter (HOSPITAL_COMMUNITY): Payer: Self-pay | Admitting: Emergency Medicine

## 2024-05-17 DIAGNOSIS — J690 Pneumonitis due to inhalation of food and vomit: Secondary | ICD-10-CM

## 2024-05-17 DIAGNOSIS — R579 Shock, unspecified: Secondary | ICD-10-CM

## 2024-05-17 DIAGNOSIS — F10931 Alcohol use, unspecified with withdrawal delirium: Secondary | ICD-10-CM

## 2024-05-17 DIAGNOSIS — S72001A Fracture of unspecified part of neck of right femur, initial encounter for closed fracture: Principal | ICD-10-CM | POA: Diagnosis present

## 2024-05-17 DIAGNOSIS — Z96641 Presence of right artificial hip joint: Secondary | ICD-10-CM

## 2024-05-17 DIAGNOSIS — J9601 Acute respiratory failure with hypoxia: Secondary | ICD-10-CM

## 2024-05-17 LAB — CBC WITH DIFFERENTIAL/PLATELET
Abs Immature Granulocytes: 0.02 10*3/uL (ref 0.00–0.07)
Basophils Absolute: 0 10*3/uL (ref 0.0–0.1)
Basophils Relative: 0 %
Eosinophils Absolute: 0 10*3/uL (ref 0.0–0.5)
Eosinophils Relative: 0 %
HCT: 41.1 % (ref 39.0–52.0)
Hemoglobin: 14.3 g/dL (ref 13.0–17.0)
Immature Granulocytes: 0 %
Lymphocytes Relative: 7 %
Lymphs Abs: 0.7 10*3/uL (ref 0.7–4.0)
MCH: 35.4 pg — ABNORMAL HIGH (ref 26.0–34.0)
MCHC: 34.8 g/dL (ref 30.0–36.0)
MCV: 101.7 fL — ABNORMAL HIGH (ref 80.0–100.0)
Monocytes Absolute: 0.6 10*3/uL (ref 0.1–1.0)
Monocytes Relative: 6 %
Neutro Abs: 8.2 10*3/uL — ABNORMAL HIGH (ref 1.7–7.7)
Neutrophils Relative %: 87 %
Platelets: 181 10*3/uL (ref 150–400)
RBC: 4.04 MIL/uL — ABNORMAL LOW (ref 4.22–5.81)
RDW: 12.4 % (ref 11.5–15.5)
WBC: 9.5 10*3/uL (ref 4.0–10.5)
nRBC: 0 % (ref 0.0–0.2)

## 2024-05-17 LAB — BASIC METABOLIC PANEL WITH GFR
Anion gap: 11 (ref 5–15)
BUN: 12 mg/dL (ref 8–23)
CO2: 24 mmol/L (ref 22–32)
Calcium: 9.1 mg/dL (ref 8.9–10.3)
Chloride: 107 mmol/L (ref 98–111)
Creatinine, Ser: 0.89 mg/dL (ref 0.61–1.24)
GFR, Estimated: >60 mL/min
Glucose, Bld: 135 mg/dL — ABNORMAL HIGH (ref 70–99)
Potassium: 3.9 mmol/L (ref 3.5–5.1)
Sodium: 142 mmol/L (ref 135–145)

## 2024-05-17 MED ORDER — OXYCODONE HCL 5 MG PO TABS
5.0000 mg | ORAL_TABLET | ORAL | Status: DC | PRN
  Administered 2024-05-20 – 2024-05-21 (×3): 5 mg via ORAL
  Filled 2024-05-17 (×3): qty 1

## 2024-05-17 MED ORDER — HYDROMORPHONE HCL 1 MG/ML IJ SOLN
0.5000 mg | Freq: Once | INTRAMUSCULAR | Status: AC
  Administered 2024-05-17: 0.5 mg via INTRAVENOUS
  Filled 2024-05-17: qty 1

## 2024-05-17 MED ORDER — FOLIC ACID 1 MG PO TABS
1.0000 mg | ORAL_TABLET | Freq: Every day | ORAL | Status: DC
  Administered 2024-05-17 – 2024-05-21 (×5): 1 mg via ORAL
  Filled 2024-05-17 (×5): qty 1

## 2024-05-17 MED ORDER — TAMSULOSIN HCL 0.4 MG PO CAPS
0.4000 mg | ORAL_CAPSULE | Freq: Every day | ORAL | Status: AC
  Administered 2024-05-17 – 2024-05-27 (×7): 0.4 mg via ORAL
  Filled 2024-05-17 (×9): qty 1

## 2024-05-17 MED ORDER — BUDESON-GLYCOPYRROL-FORMOTEROL 160-9-4.8 MCG/ACT IN AERO
2.0000 | INHALATION_SPRAY | Freq: Two times a day (BID) | RESPIRATORY_TRACT | Status: DC
  Administered 2024-05-18 (×2): 2 via RESPIRATORY_TRACT
  Filled 2024-05-17: qty 5.9

## 2024-05-17 MED ORDER — ONDANSETRON HCL 4 MG/2ML IJ SOLN: 4.0000 mg | Freq: Four times a day (QID) | INTRAMUSCULAR | Status: AC | PRN

## 2024-05-17 MED ORDER — HYDROMORPHONE HCL 1 MG/ML IJ SOLN
0.5000 mg | INTRAMUSCULAR | Status: DC | PRN
  Administered 2024-05-17 – 2024-05-21 (×27): 1 mg via INTRAVENOUS
  Filled 2024-05-17 (×27): qty 1

## 2024-05-17 MED ORDER — AMLODIPINE BESYLATE 5 MG PO TABS
5.0000 mg | ORAL_TABLET | Freq: Every day | ORAL | Status: DC
  Administered 2024-05-17 – 2024-05-21 (×5): 5 mg via ORAL
  Filled 2024-05-17 (×5): qty 1

## 2024-05-17 MED ORDER — HYDROMORPHONE HCL 1 MG/ML IJ SOLN
0.5000 mg | INTRAMUSCULAR | Status: DC | PRN
  Administered 2024-05-17: 0.5 mg via INTRAVENOUS
  Filled 2024-05-17: qty 1

## 2024-05-17 MED ORDER — ACETAMINOPHEN 325 MG PO TABS: 650.0000 mg | ORAL_TABLET | Freq: Four times a day (QID) | ORAL | Status: DC | PRN

## 2024-05-17 MED ORDER — THIAMINE MONONITRATE 100 MG PO TABS
100.0000 mg | ORAL_TABLET | Freq: Every day | ORAL | Status: DC
  Administered 2024-05-17 – 2024-05-21 (×5): 100 mg via ORAL
  Filled 2024-05-17 (×5): qty 1

## 2024-05-17 MED ORDER — THIAMINE HCL 100 MG/ML IJ SOLN
100.0000 mg | Freq: Every day | INTRAMUSCULAR | Status: DC
  Administered 2024-05-22: 100 mg via INTRAVENOUS
  Filled 2024-05-17: qty 2

## 2024-05-17 MED ORDER — ONDANSETRON HCL 4 MG PO TABS: 4.0000 mg | ORAL_TABLET | Freq: Four times a day (QID) | ORAL | Status: DC | PRN

## 2024-05-17 MED ORDER — ESCITALOPRAM OXALATE 20 MG PO TABS
20.0000 mg | ORAL_TABLET | Freq: Every day | ORAL | Status: DC
  Administered 2024-05-17 – 2024-05-21 (×5): 20 mg via ORAL
  Filled 2024-05-17 (×2): qty 1
  Filled 2024-05-17: qty 2
  Filled 2024-05-17 (×2): qty 1

## 2024-05-17 MED ORDER — ALBUTEROL SULFATE (2.5 MG/3ML) 0.083% IN NEBU
2.5000 mg | INHALATION_SOLUTION | RESPIRATORY_TRACT | Status: AC | PRN
  Administered 2024-05-18 – 2024-05-21 (×9): 2.5 mg via RESPIRATORY_TRACT
  Filled 2024-05-17 (×10): qty 3

## 2024-05-17 MED ORDER — ACETAMINOPHEN 650 MG RE SUPP: 650.0000 mg | Freq: Four times a day (QID) | RECTAL | Status: DC | PRN

## 2024-05-17 MED ORDER — TRAZODONE HCL 50 MG PO TABS: 25.0000 mg | ORAL_TABLET | Freq: Every evening | ORAL | Status: AC | PRN

## 2024-05-17 MED ORDER — LORAZEPAM 1 MG PO TABS
1.0000 mg | ORAL_TABLET | ORAL | Status: AC | PRN
  Filled 2024-05-17: qty 1

## 2024-05-17 MED ORDER — EZETIMIBE 10 MG PO TABS
10.0000 mg | ORAL_TABLET | Freq: Every day | ORAL | Status: DC
  Administered 2024-05-17 – 2024-05-21 (×4): 10 mg via ORAL
  Filled 2024-05-17 (×4): qty 1

## 2024-05-17 MED ORDER — ROSUVASTATIN CALCIUM 20 MG PO TABS
40.0000 mg | ORAL_TABLET | Freq: Every day | ORAL | Status: DC
  Administered 2024-05-17 – 2024-05-21 (×4): 40 mg via ORAL
  Filled 2024-05-17 (×4): qty 2

## 2024-05-17 MED ORDER — ADULT MULTIVITAMIN W/MINERALS CH
1.0000 | ORAL_TABLET | Freq: Every day | ORAL | Status: DC
  Administered 2024-05-17 – 2024-05-21 (×4): 1 via ORAL
  Filled 2024-05-17 (×4): qty 1

## 2024-05-17 MED ORDER — LORAZEPAM 2 MG/ML IJ SOLN
1.0000 mg | INTRAMUSCULAR | Status: AC | PRN
  Administered 2024-05-18 – 2024-05-19 (×2): 3 mg via INTRAVENOUS
  Administered 2024-05-19: 1 mg via INTRAVENOUS
  Administered 2024-05-20: 3 mg via INTRAVENOUS
  Administered 2024-05-20: 1 mg via INTRAVENOUS
  Administered 2024-05-20: 2 mg via INTRAVENOUS
  Filled 2024-05-17 (×7): qty 1
  Filled 2024-05-17: qty 2

## 2024-05-17 NOTE — H&P (Signed)
 " History and Physical  Ralph [name] FMW:[id number] DOB: [birth year] DOA: 05/17/2024  PCP: Cleotilde Planas, MD   Chief Complaint: Slip on ice, right hip pain  HPI: Ralph [name] is a 74 y.o. male with medical history significant for COPD, hyperlipidemia, hypertension, GERD being admitted to the hospital with a mechanical fall this afternoon, resulting in right hip fracture.  Patient states he was coming home from somewhere, walking about 20 feet from his car to his house, slipped on a patch of ice that he had not seen, fell onto his right side, had immediate pain and inability to bear weight.  Denies any recent fevers, chills, nausea, vomiting, did not hit head or lose consciousness, not on any blood thinners.  Review of Systems: Please see HPI for pertinent positives and negatives. A complete 10 system review of systems are otherwise negative.  Past Medical History:  Diagnosis Date   Arthritis    hands   Asthma    GERD (gastroesophageal reflux disease)    History of kidney stones    x2  -episodes   Hyperlipidemia    Renal disorder    Varicose veins    bilateral surgeries- no problems now   Past Surgical History:  Procedure Laterality Date   COLONOSCOPY WITH PROPOFOL  N/A 03/20/2015   Procedure: COLONOSCOPY WITH PROPOFOL ;  Surgeon: Gladis MARLA Louder, MD;  Location: [facility];  Service: Endoscopy;  Laterality: N/A;   HERNIA REPAIR     inguinal hernia   TONSILLECTOMY     VARICOSE VEIN SURGERY Bilateral    2 years ago   VASECTOMY     Social History:  reports that he quit smoking about 11 years ago. His smoking use included cigarettes. He started smoking about 31 years ago. He has a 10 pack-year smoking history. He has never used smokeless tobacco. He reports current alcohol use. He reports that he does not use drugs.  Allergies[1]  Family History  Problem Relation Age of Onset   Dementia Mother    COPD Father      Prior to Admission medications  Medication Sig Start  Date End Date Taking? Authorizing Provider  albuterol  (PROVENTIL ) (2.5 MG/3ML) 0.083% nebulizer solution Take 3 mLs (2.5 mg total) by nebulization every 6 (six) hours as needed for wheezing or shortness of breath. 02/26/24 02/25/25  Cobb, Comer GAILS, NP  amLODipine  (NORVASC ) 5 MG tablet Take 1 tablet (5 mg total) by mouth daily. 12/12/23   Maree, Pratik D, DO  aspirin  EC 81 MG tablet Take 1 tablet (81 mg total) by mouth daily. Swallow whole. 12/12/23   Maree, Pratik D, DO  Budeson-Glycopyrrol-Formoterol  (BREZTRI  AEROSPHERE) 160-9-4.8 MCG/ACT AERO Inhale 2 puffs into the lungs daily.    [provider]  Camphor-Menthol-Methyl Sal 1.2-5.7-6.3 % PTCH Apply 1 patch topically daily as needed (bilateral hip pain).    [provider]  Capsicum, Cayenne, (CAYENNE PEPPER PO) Take 1 Dose by mouth every morning. Mix 1/2 teaspoon cayenne pepper with 2oz of water and drink daily.    [provider]  escitalopram  (LEXAPRO ) 20 MG tablet Take 20 mg by mouth daily.    [provider]  ezetimibe  (ZETIA ) 10 MG tablet Take 10 mg by mouth daily.    [provider]  guaiFENesin  (MUCINEX ) 600 MG 12 hr tablet Take 2 tablets (1,200 mg total) by mouth 2 (two) times daily. 02/26/24   Cobb, Comer GAILS, NP  ibuprofen  (ADVIL ) 200 MG tablet Take 800 mg by mouth every 8 (eight) hours  as needed for mild pain (pain score 1-3), headache or cramping.    [provider]  omeprazole (PRILOSEC) 20 MG capsule Take 20 mg by mouth daily.    [provider]  predniSONE  (DELTASONE ) 10 MG tablet 4 tabs for 2 days, then 3 tabs for 2 days, 2 tabs for 2 days, then 1 tab for 2 days, then stop 02/26/24   Cobb, Comer GAILS, NP  rosuvastatin  (CRESTOR ) 40 MG tablet Take 40 mg by mouth daily. 10/23/23   [provider]  sodium chloride  HYPERTONIC 3 % nebulizer solution Take by nebulization in the morning and at bedtime. 3 mL each treatment 02/26/24   Cobb, Comer GAILS, NP  tamsulosin  (FLOMAX )  0.4 MG CAPS capsule Take 0.4 mg by mouth daily after supper.    [provider]    Physical Exam: BP (!) 146/110   Pulse (!) 53   Temp 98.1 F (36.7 C) (Oral)   Resp 19   SpO2 93%  General:  Alert, oriented, calm, in no acute distress  Eyes: EOMI, clear conjuctivae, white sclerea Neck: supple, no masses, trachea mildline  Cardiovascular: RRR, no murmurs or rubs, no peripheral edema  Respiratory: clear to auscultation bilaterally, no wheezes, no crackles  Abdomen: soft, nontender, nondistended, normal bowel tones heard  Skin: dry, no rashes  Musculoskeletal: no joint effusions, right hip tender to palpation, right lower extremity is foreshortened and externally rotated Psychiatric: appropriate affect, normal speech  Neurologic: extraocular muscles intact, clear speech, moving all extremities with intact sensorium         Labs on Admission:  Basic Metabolic Panel: No results for input(s): NA, K, CL, CO2, GLUCOSE, BUN, CREATININE, CALCIUM , MG, PHOS in the last 168 hours. Liver Function Tests: No results for input(s): AST, ALT, ALKPHOS, BILITOT, PROT, ALBUMIN in the last 168 hours. No results for input(s): LIPASE, AMYLASE in the last 168 hours. No results for input(s): AMMONIA in the last 168 hours. CBC: Recent Labs  Lab 05/17/24 1927  WBC 9.5  NEUTROABS 8.2*  HGB 14.3  HCT 41.1  MCV 101.7*  PLT 181   Cardiac Enzymes: No results for input(s): CKTOTAL, CKMB, CKMBINDEX, TROPONINI in the last 168 hours. BNP (last 3 results) Recent Labs    05/25/23 0421  BNP 48.0    ProBNP (last 3 results) No results for input(s): PROBNP in the last 8760 hours.  CBG: No results for input(s): GLUCAP in the last 168 hours.  Radiological Exams on Admission: CT Cervical Spine Wo Contrast Result Date: 05/17/2024 EXAM: CT CERVICAL SPINE WITHOUT CONTRAST 05/17/2024 06:36:39 PM TECHNIQUE: CT of the cervical spine was performed without  the administration of intravenous contrast. Multiplanar reformatted images are provided for review. Automated exposure control, iterative reconstruction, and/or weight based adjustment of the mA/kV was utilized to reduce the radiation dose to as low as reasonably achievable. COMPARISON: None available. CLINICAL HISTORY: Neck trauma (Age >= 65y). Neck trauma. Age >= 65 years. FINDINGS: BONES AND ALIGNMENT: No acute fracture or traumatic malalignment. DEGENERATIVE CHANGES: Multilevel moderate degenerative changes of the spine. Posterior disc osteophyte complex formation at the C6-C7 level. SOFT TISSUES: No prevertebral soft tissue swelling. IMPRESSION: 1. No evidence of acute traumatic injury. Electronically signed by: Morgane Naveau MD 05/17/2024 07:20 PM EST RP Workstation: [id number]   CT Head Wo Contrast Result Date: 05/17/2024 EXAM: CT HEAD WITHOUT CONTRAST 05/17/2024 06:36:39 PM TECHNIQUE: CT of the head was performed without the administration of intravenous contrast. Automated exposure control, iterative reconstruction, and/or weight based adjustment  of the mA/kV was utilized to reduce the radiation dose to as low as reasonably achievable. COMPARISON: 08/11/2023 CLINICAL HISTORY: Head trauma, minor (Age >= 65 years). FINDINGS: BRAIN AND VENTRICLES: No acute hemorrhage. No evidence of acute infarct. No hydrocephalus. No extra-axial collection. No mass effect or midline shift. Generalized cerebral and cerebellar volume loss. Moderate periventricular white matter disease. ORBITS: No acute abnormality. SINUSES: Mucosal disease within the ethmoid air cells and sphenoid sinus. SOFT TISSUES AND SKULL: No acute soft tissue abnormality. No skull fracture. Mild calcific atheromatous disease within the carotid siphons. IMPRESSION: 1. No acute intracranial abnormality related to the head trauma. Electronically signed by: Morgane Naveau MD 05/17/2024 07:11 PM EST RP Workstation: [id number]   DG Chest 1 View Result  Date: 05/17/2024 EXAM: 1 VIEW(S) XRAY OF THE CHEST 05/17/2024 05:47:04 PM COMPARISON: 12/10/2023 CLINICAL HISTORY: Recent fall with known right hip fracture. FINDINGS: LUNGS AND PLEURA: No focal pulmonary opacity. No pleural effusion. No pneumothorax. HEART AND MEDIASTINUM: No acute abnormality of the cardiac and mediastinal silhouettes. BONES AND SOFT TISSUES: No acute osseous abnormality. IMPRESSION: 1. No acute cardiopulmonary pathology. Electronically signed by: Oneil Devonshire MD 05/17/2024 06:23 PM EST RP Workstation: [id number]   DG Hip Unilat W or Wo Pelvis 2-3 Views Right Result Date: 05/17/2024 EXAM: 2 or 3 VIEW(S) XRAY OF THE UNILATERAL HIP 05/17/2024 05:47:04 PM COMPARISON: None available. CLINICAL HISTORY: Hip pain. FINDINGS: BONES AND JOINTS: Displaced right femoral neck fracture. Small bone fragment medially. Mild varus angulation. SOFT TISSUES: Unremarkable. IMPRESSION: 1. Displaced right femoral neck fracture with mild varus angulation and a small bone fragment medially. Electronically signed by: Oneil Devonshire MD 05/17/2024 06:22 PM EST RP Workstation: [id number]   Assessment/Plan Ralph [name] is a 74 y.o. male with medical history significant for COPD, hyperlipidemia, hypertension, GERD being admitted to the hospital with a mechanical fall this afternoon, resulting in displaced right femoral neck fracture.  Right femoral neck fracture-due to mechanical fall -Inpatient admission -Pain control -N.p.o. after midnight -ER provider has discussed with Dr. 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 Ativan  per CIWA protocol  Moderately severe COPD-without evidence of acute exacerbation, patient is on room air at baseline -Breztri  twice daily -Albuterol  nebulizer as needed for cough, or  wheezing  BPH-Flomax   Depression-Lexapro   DVT prophylaxis: Lovenox      Code Status: Full Code  Consults called: Orthopedic surgery  Admission status: The appropriate patient status for this patient is INPATIENT. Inpatient status is judged to be reasonable and necessary in order to provide the required intensity of service to ensure the patient's safety. The patient's presenting symptoms, physical exam findings, and initial radiographic and laboratory data in the context of their chronic comorbidities is felt to place them at high risk for further clinical deterioration. Furthermore, it is not anticipated that the patient will be medically stable for discharge from the hospital within 2 midnights of admission.    I certify that at the point of admission it is my clinical judgment that the patient will require inpatient hospital care spanning beyond 2 midnights from the point of admission due to high intensity of service, high risk for further deterioration and high frequency of surveillance required  Time spent: 53 minutes  Caine Barfield CHRISTELLA Gail MD [facility] Pager [phone]  If 7PM-7AM, please contact night-coverage [email or web address] Password [email or web address]  05/17/2024, 7:50 PM      [  1]  Allergies Allergen Reactions   Statins Other (See Comments)    Myalgia- leg cramps   Latex Hives and Rash   Morphine And Codeine Hives   "

## 2024-05-17 NOTE — ED Provider Notes (Signed)
 " [place] EMERGENCY DEPARTMENT AT [facility] Provider Note   CSN: [id number] Arrival date & time: 05/17/24  1610     History Chief Complaint  Patient presents with   Fall     Fall   HPI: Ralph [name] is a 74 y.o. male with history perinent COPD not on home oxygen, prior TIA, hypertension, HLD who presents complaining of fall, right hip pain. Patient arrived via EMS.  History provided by patient.  No interpreter required during this encounter.  Patient reports that he was walking outside on the ice when he had a slip and fall with a hard land on his right hip.  He denies hitting his head, loss of consciousness.  States that he was unable to get up on his own, and was down until EMS came to get him.  Reports that he did have partial pain relief and route with EMS after receiving fentanyl , however reports that he is still having severe pain, denies any headache, neck pain, back pain, denies use of anticoagulants, denies abdominal pain.  Patient's recorded medical, surgical, social, medication list and allergies were reviewed in the Snapshot window as part of the initial history.   Prior to Admission medications  Medication Sig Start Date End Date Taking? Authorizing Provider  albuterol  (PROVENTIL ) (2.5 MG/3ML) 0.083% nebulizer solution Take 3 mLs (2.5 mg total) by nebulization every 6 (six) hours as needed for wheezing or shortness of breath. 02/26/24 02/25/25  Cobb, Comer GAILS, NP  amLODipine  (NORVASC ) 5 MG tablet Take 1 tablet (5 mg total) by mouth daily. 12/12/23   Maree, Pratik D, DO  aspirin  EC 81 MG tablet Take 1 tablet (81 mg total) by mouth daily. Swallow whole. 12/12/23   Maree, Pratik D, DO  Budeson-Glycopyrrol-Formoterol  (BREZTRI  AEROSPHERE) 160-9-4.8 MCG/ACT AERO Inhale 2 puffs into the lungs daily.    [provider]  Camphor-Menthol-Methyl Sal 1.2-5.7-6.3 % PTCH Apply 1 patch topically daily as needed (bilateral hip pain).    [provider]   Capsicum, Cayenne, (CAYENNE PEPPER PO) Take 1 Dose by mouth every morning. Mix 1/2 teaspoon cayenne pepper with 2oz of water and drink daily.    [provider]  escitalopram  (LEXAPRO ) 20 MG tablet Take 20 mg by mouth daily.    [provider]  ezetimibe  (ZETIA ) 10 MG tablet Take 10 mg by mouth daily.    [provider]  guaiFENesin  (MUCINEX ) 600 MG 12 hr tablet Take 2 tablets (1,200 mg total) by mouth 2 (two) times daily. 02/26/24   Cobb, Comer GAILS, NP  ibuprofen  (ADVIL ) 200 MG tablet Take 800 mg by mouth every 8 (eight) hours as needed for mild pain (pain score 1-3), headache or cramping.    [provider]  omeprazole (PRILOSEC) 20 MG capsule Take 20 mg by mouth daily.    [provider]  predniSONE  (DELTASONE ) 10 MG tablet 4 tabs for 2 days, then 3 tabs for 2 days, 2 tabs for 2 days, then 1 tab for 2 days, then stop 02/26/24   Cobb, Comer GAILS, NP  rosuvastatin  (CRESTOR ) 40 MG tablet Take 40 mg by mouth daily. 10/23/23   [provider]  sodium chloride  HYPERTONIC 3 % nebulizer solution Take by nebulization in the morning and at bedtime. 3 mL each treatment 02/26/24   Cobb, Comer GAILS, NP  tamsulosin  (FLOMAX ) 0.4 MG CAPS capsule Take 0.4 mg by mouth daily after supper.    [provider]     Allergies: Statins, Latex,  and Morphine and codeine   Review of Systems   ROS as per HPI  Physical Exam Updated Vital Signs BP (!) 146/110   Pulse (!) 53   Temp 98.1 F (36.7 C) (Oral)   Resp 19   SpO2 93%  Physical Exam Vitals and nursing note reviewed.  Constitutional:      General: He is not in acute distress.    Appearance: He is well-developed.  HENT:     Head: Normocephalic and atraumatic.  Eyes:     Conjunctiva/sclera: Conjunctivae normal.  Cardiovascular:     Rate and Rhythm: Normal rate and regular rhythm.     Heart sounds: No murmur heard. Pulmonary:     Effort: Pulmonary effort is normal. No respiratory distress.      Breath sounds: Normal breath sounds.  Abdominal:     Palpations: Abdomen is soft.     Tenderness: There is no abdominal tenderness.  Musculoskeletal:        General: No swelling.     Cervical back: Neck supple.     Comments: Chest wall stable, nontender to palpation, pelvis stable to palpation, tender to palpation of right side, patient able to plantar and dorsiflex right foot, 2+ DP and PT pulses  Skin:    General: Skin is warm and dry.     Capillary Refill: Capillary refill takes less than 2 seconds.  Neurological:     Mental Status: He is alert.  Psychiatric:        Mood and Affect: Mood normal.     ED Course/ Medical Decision Making/ A&P    Procedures Procedures   Medications Ordered in ED Medications  amLODipine  (NORVASC ) tablet 5 mg (has no administration in time range)  rosuvastatin  (CRESTOR ) tablet 40 mg (has no administration in time range)  LORazepam  (ATIVAN ) tablet 1-4 mg (has no administration in time range)    Or  LORazepam  (ATIVAN ) injection 1-4 mg (has no administration in time range)  thiamine  (VITAMIN B1) tablet 100 mg (has no administration in time range)    Or  thiamine  (VITAMIN B1) injection 100 mg (has no administration in time range)  folic acid  (FOLVITE ) tablet 1 mg (has no administration in time range)  multivitamin with minerals tablet 1 tablet (has no administration in time range)  acetaminophen  (TYLENOL ) tablet 650 mg (has no administration in time range)    Or  acetaminophen  (TYLENOL ) suppository 650 mg (has no administration in time range)  traZODone  (DESYREL ) tablet 25 mg (has no administration in time range)  ondansetron  (ZOFRAN ) tablet 4 mg (has no administration in time range)    Or  ondansetron  (ZOFRAN ) injection 4 mg (has no administration in time range)  albuterol  (PROVENTIL ) (2.5 MG/3ML) 0.083% nebulizer solution 2.5 mg (has no administration in time range)  oxyCODONE  (Oxy IR/ROXICODONE ) immediate release tablet 5 mg (has no  administration in time range)  HYDROmorphone  (DILAUDID ) injection 0.5-1 mg (has no administration in time range)  ezetimibe  (ZETIA ) tablet 10 mg (has no administration in time range)  budesonide -glycopyrrolate -formoterol  (BREZTRI ) 160-9-4.8 MCG/ACT inhaler 2 puff (has no administration in time range)  escitalopram  (LEXAPRO ) tablet 20 mg (has no administration in time range)  tamsulosin  (FLOMAX ) capsule 0.4 mg (has no administration in time range)  HYDROmorphone  (DILAUDID ) injection 0.5 mg (0.5 mg Intravenous Given 05/17/24 1725)    Medical Decision Making:   Ralph [name] is a 74 y.o. male who presents for fall as per above.  Physical exam is pertinent for tenderness to palpation of right  hip, distally NVI  The differential includes but is not limited to ICH, TBI, skull fracture, spinal fracture/dislocation, blunt thoracic trauma, hemothorax, pneumothorax, rib fractures, blunt abdominal trauma, hemorrhage, extremity fracture, dislocation.  Independent historian: None  External data reviewed: Labs: reviewed prior labs for baseline  Labs: Ordered and Independent interpretation CBC: No leukocytosis, anemia, thrombocytopenia. BMP: No AKI, emergent electrolyte derangement  Radiology: Ordered, Independent interpretation, and All images reviewed independently.  Agree with radiology report at this time.   CXR: No acute cardiac or pulmonary abnormality. No appreciable rib fx. No PTX.  Pelvis XR: Pelvic ring intact.  Patient does have right femoral neck fracture. CT head: No ICH or displaced skull fracture CT C-spine: No displaced fracture or dislocation  CT Cervical Spine Wo Contrast Result Date: 05/17/2024 EXAM: CT CERVICAL SPINE WITHOUT CONTRAST 05/17/2024 06:36:39 PM TECHNIQUE: CT of the cervical spine was performed without the administration of intravenous contrast. Multiplanar reformatted images are provided for review. Automated exposure control, iterative reconstruction, and/or weight  based adjustment of the mA/kV was utilized to reduce the radiation dose to as low as reasonably achievable. COMPARISON: None available. CLINICAL HISTORY: Neck trauma (Age >= 65y). Neck trauma. Age >= 65 years. FINDINGS: BONES AND ALIGNMENT: No acute fracture or traumatic malalignment. DEGENERATIVE CHANGES: Multilevel moderate degenerative changes of the spine. Posterior disc osteophyte complex formation at the C6-C7 level. SOFT TISSUES: No prevertebral soft tissue swelling. IMPRESSION: 1. No evidence of acute traumatic injury. Electronically signed by: Morgane Naveau MD 05/17/2024 07:20 PM EST RP Workstation: [id number]   CT Head Wo Contrast Result Date: 05/17/2024 EXAM: CT HEAD WITHOUT CONTRAST 05/17/2024 06:36:39 PM TECHNIQUE: CT of the head was performed without the administration of intravenous contrast. Automated exposure control, iterative reconstruction, and/or weight based adjustment of the mA/kV was utilized to reduce the radiation dose to as low as reasonably achievable. COMPARISON: 08/11/2023 CLINICAL HISTORY: Head trauma, minor (Age >= 65 years). FINDINGS: BRAIN AND VENTRICLES: No acute hemorrhage. No evidence of acute infarct. No hydrocephalus. No extra-axial collection. No mass effect or midline shift. Generalized cerebral and cerebellar volume loss. Moderate periventricular white matter disease. ORBITS: No acute abnormality. SINUSES: Mucosal disease within the ethmoid air cells and sphenoid sinus. SOFT TISSUES AND SKULL: No acute soft tissue abnormality. No skull fracture. Mild calcific atheromatous disease within the carotid siphons. IMPRESSION: 1. No acute intracranial abnormality related to the head trauma. Electronically signed by: Morgane Naveau MD 05/17/2024 07:11 PM EST RP Workstation: [id number]   DG Chest 1 View Result Date: 05/17/2024 EXAM: 1 VIEW(S) XRAY OF THE CHEST 05/17/2024 05:47:04 PM COMPARISON: 12/10/2023 CLINICAL HISTORY: Recent fall with known right hip fracture. FINDINGS:  LUNGS AND PLEURA: No focal pulmonary opacity. No pleural effusion. No pneumothorax. HEART AND MEDIASTINUM: No acute abnormality of the cardiac and mediastinal silhouettes. BONES AND SOFT TISSUES: No acute osseous abnormality. IMPRESSION: 1. No acute cardiopulmonary pathology. Electronically signed by: Oneil Devonshire MD 05/17/2024 06:23 PM EST RP Workstation: [id number]   DG Hip Unilat W or Wo Pelvis 2-3 Views Right Result Date: 05/17/2024 EXAM: 2 or 3 VIEW(S) XRAY OF THE UNILATERAL HIP 05/17/2024 05:47:04 PM COMPARISON: None available. CLINICAL HISTORY: Hip pain. FINDINGS: BONES AND JOINTS: Displaced right femoral neck fracture. Small bone fragment medially. Mild varus angulation. SOFT TISSUES: Unremarkable. IMPRESSION: 1. Displaced right femoral neck fracture with mild varus angulation and a small bone fragment medially. Electronically signed by: Oneil Devonshire MD 05/17/2024 06:22 PM EST RP Workstation: [id number]    EKG/Medicine tests: Not indicated EKG Interpretation:  Interventions: Dilaudid   See the EMR for full details regarding lab and imaging results.  Currently, patient is awake, alert, and protecting own airway and is hemodynamically stable.  Patient has focal pain to his right hip, therefore I will obtain focused x-rays of this area, as well as screening chest x-ray, as well as CT of the head and C-spine given fall and age greater than 37.  X-ray unfortunately does show displaced proximal femur fracture, pain was treated with Dilaudid .  Given femur fracture which will likely require operative management, screening labs will be obtained.  Patient has previously been seen by [facility], therefore I consulted Dr. Duwayne with orthopedic surgery, who states that orthopedic surgery will evaluate for operative intervention in the a.m.  Consulted hospitalist for admission, spoke with Dr. Zella, who accepted the patient to his service, no additional acute events while under my  care.  Presentation is most consistent with acute complicated illness, Current presentation is complicated by underlying chronic conditions, and I did consider and rule out acute life/limb-threatening illness  Discussion of management or test interpretations with external provider(s): Dr. Duwayne, orthopedic surgery, Dr. Zella, hospitalists  Risk Drugs:Prescription drug management and Parenteral controlled substances Treatment: Decision regarding hospitalization  Disposition: ADMIT: I believe the patient requires admission for further care and management. The patient was admitted to hospitalist with a orthopedic surgery consult. Please see inpatient provider note for additional treatment plan details.   MDM generated using voice dictation software and may contain dictation errors.  Please contact me for any clarification or with any questions.  Clinical Impression:  1. Closed fracture of neck of right femur, initial encounter (HCC)      Admit   Final Clinical Impression(s) / ED Diagnoses Final diagnoses:  Closed fracture of neck of right femur, initial encounter [facility])    Rx / DC Orders ED Discharge Orders     None        Rogelia Jerilynn RAMAN, MD 05/17/24 2017  "

## 2024-05-17 NOTE — ED Triage Notes (Addendum)
 Patient BIB EMS from home c/o fall. Per EMS report, patient slipped on ice and fell onto his right hip. Denies head strike, LOC and not taking blood thinners. Received fentanyl  100 mcg IV by EMS. Reports right hip pain 6/10 on arrival.

## 2024-05-18 ENCOUNTER — Encounter (HOSPITAL_COMMUNITY): Payer: Self-pay

## 2024-05-18 ENCOUNTER — Inpatient Hospital Stay (HOSPITAL_COMMUNITY)

## 2024-05-18 DIAGNOSIS — S72001D Fracture of unspecified part of neck of right femur, subsequent encounter for closed fracture with routine healing: Secondary | ICD-10-CM | POA: Diagnosis not present

## 2024-05-18 LAB — BASIC METABOLIC PANEL WITH GFR
Anion gap: 18 — ABNORMAL HIGH (ref 5–15)
BUN: 16 mg/dL (ref 8–23)
CO2: 18 mmol/L — ABNORMAL LOW (ref 22–32)
Calcium: 9.8 mg/dL (ref 8.9–10.3)
Chloride: 103 mmol/L (ref 98–111)
Creatinine, Ser: 1.18 mg/dL (ref 0.61–1.24)
GFR, Estimated: >60 mL/min
Glucose, Bld: 129 mg/dL — ABNORMAL HIGH (ref 70–99)
Potassium: 4.8 mmol/L (ref 3.5–5.1)
Sodium: 139 mmol/L (ref 135–145)

## 2024-05-18 LAB — CBC
HCT: 45 % (ref 39.0–52.0)
Hemoglobin: 15.4 g/dL (ref 13.0–17.0)
MCH: 35 pg — ABNORMAL HIGH (ref 26.0–34.0)
MCHC: 34.2 g/dL (ref 30.0–36.0)
MCV: 102.3 fL — ABNORMAL HIGH (ref 80.0–100.0)
Platelets: 217 10*3/uL (ref 150–400)
RBC: 4.4 MIL/uL (ref 4.22–5.81)
RDW: 12.6 % (ref 11.5–15.5)
WBC: 9.9 10*3/uL (ref 4.0–10.5)
nRBC: 0 % (ref 0.0–0.2)

## 2024-05-18 LAB — SURGICAL PCR SCREEN
MRSA, PCR: NEGATIVE
Staphylococcus aureus: NEGATIVE

## 2024-05-18 MED ORDER — ENOXAPARIN SODIUM 30 MG/0.3ML IJ SOSY
30.0000 mg | PREFILLED_SYRINGE | Freq: Two times a day (BID) | INTRAMUSCULAR | Status: AC
  Administered 2024-05-18: 30 mg via SUBCUTANEOUS
  Filled 2024-05-18: qty 0.3

## 2024-05-18 MED ORDER — HYDROXYZINE HCL 10 MG PO TABS
10.0000 mg | ORAL_TABLET | Freq: Three times a day (TID) | ORAL | Status: DC | PRN
  Administered 2024-05-18: 10 mg via ORAL
  Filled 2024-05-18 (×2): qty 1

## 2024-05-18 NOTE — Consult Note (Signed)
 "   ORTHOPAEDIC CONSULTATION  REQUESTING PHYSICIAN: Trixie Nilda HERO, MD  PCP:  Cleotilde Planas, MD  Chief Complaint: right hip pain fall.   HPI: Ralph [name] is a 74 y.o. male PMH COPD, HTN, EtOH use, TIA history aspirin  81mg  baseline presented to the ED yesterday for fall on the right side after he slipped on the ice. He denies LOC or hitting his head. He had immediate right hip pain and inability to bear weight. X-rays showed right femoral neck fracture. Orthopedics consulted. [facility] consulted for admission and perioperative optimization. He denies any tingling or numbness in LE bilaterally.   TIA history takes aspirin  81mg  daily. Lives at home with his wife. Does not normally use assist devices at baseline, does report he has a rolling walker at home. Denies DVT history, T2DM history.    Past Medical History:  Diagnosis Date   Arthritis    hands   Asthma    GERD (gastroesophageal reflux disease)    History of kidney stones    x2  -episodes   Hyperlipidemia    Renal disorder    Varicose veins    bilateral surgeries- no problems now   Past Surgical History:  Procedure Laterality Date   COLONOSCOPY WITH PROPOFOL  N/A 03/20/2015   Procedure: COLONOSCOPY WITH PROPOFOL ;  Surgeon: Gladis MARLA Louder, MD;  Location: [facility];  Service: Endoscopy;  Laterality: N/A;   HERNIA REPAIR     inguinal hernia   TONSILLECTOMY     VARICOSE VEIN SURGERY Bilateral    2 years ago   VASECTOMY     Social History   Socioeconomic History   Marital status: Significant Other    Spouse name: Not on file   Number of children: Not on file   Years of education: Not on file   Highest education level: Not on file  Occupational History   Not on file  Tobacco Use   Smoking status: Former    Current packs/day: 0.00    Average packs/day: 0.5 packs/day for 20.0 years (10.0 ttl pk-yrs)    Types: Cigarettes    Start date: 04/21/1993    Quit date: 04/21/2013    Years since quitting: 11.0   Smokeless  tobacco: Never  Vaping Use   Vaping status: Never Used  Substance and Sexual Activity   Alcohol use: Yes    Comment: social   Drug use: No   Sexual activity: Not Currently  Other Topics Concern   Not on file  Social History Narrative   Not on file   Social Drivers of Health   Tobacco Use: Medium Risk (05/17/2024)   Patient History    Smoking Tobacco Use: Former    Smokeless Tobacco Use: Never    Passive Exposure: Not on Actuary Strain: Not on file  Food Insecurity: No Food Insecurity (05/17/2024)   Epic    Worried About Programme Researcher, Broadcasting/film/video in the Last Year: Never true    Ran Out of Food in the Last Year: Never true  Transportation Needs: No Transportation Needs (05/17/2024)   Epic    Lack of Transportation (Medical): No    Lack of Transportation (Non-Medical): No  Physical Activity: Not on file  Stress: Not on file  Social Connections: Moderately Integrated (05/17/2024)   Social Connection and Isolation Panel    Frequency of Communication with Friends and Family: More than three times a week    Frequency of Social Gatherings with Friends and Family: More than three times  a week    Attends Religious Services: More than 4 times per year    Active Member of Clubs or Organizations: No    Attends Banker Meetings: Never    Marital Status: Married  Depression (PHQ2-9): Not on file  Alcohol Screen: Not on file  Housing: Low Risk (05/17/2024)   Epic    Unable to Pay for Housing in the Last Year: No    Number of Times Moved in the Last Year: 0    Homeless in the Last Year: No  Utilities: Not At Risk (05/17/2024)   Epic    Threatened with loss of utilities: No  Health Literacy: Not on file   Family History  Problem Relation Age of Onset   Dementia Mother    COPD Father    Allergies[1] Prior to Admission medications  Medication Sig Start Date End Date Taking? Authorizing Provider  albuterol  (PROVENTIL ) (2.5 MG/3ML) 0.083% nebulizer solution Take  3 mLs (2.5 mg total) by nebulization every 6 (six) hours as needed for wheezing or shortness of breath. 02/26/24 02/25/25 Yes Cobb, Comer GAILS, NP  albuterol  (VENTOLIN  HFA) 108 (90 Base) MCG/ACT inhaler Inhale 2 puffs into the lungs every 6 (six) hours as needed for wheezing or shortness of breath.   Yes [provider]  aspirin  EC 81 MG tablet Take 1 tablet (81 mg total) by mouth daily. Swallow whole. 12/12/23  Yes Shah, Pratik D, DO  Budeson-Glycopyrrol-Formoterol  (BREZTRI  AEROSPHERE) 160-9-4.8 MCG/ACT AERO Inhale 2 puffs into the lungs daily.   Yes [provider]  Camphor-Menthol-Methyl Sal 1.2-5.7-6.3 % PTCH Apply 1 patch topically daily as needed (bilateral hip pain).   Yes [provider]  Capsicum, Cayenne, (CAYENNE PEPPER PO) Take 3 g by mouth See admin instructions. Mix 3 grams (1/2 teaspoonful) of cayenne pepper into 2 ounces of water and drink by mouth once a day   Yes [provider]  escitalopram  (LEXAPRO ) 20 MG tablet Take 20 mg by mouth See admin instructions. Take 20 mg by mouth in the morning and after supper   Yes [provider]  guaiFENesin  (MUCINEX ) 600 MG 12 hr tablet Take 2 tablets (1,200 mg total) by mouth 2 (two) times daily. Patient taking differently: Take 600 mg by mouth 2 (two) times daily. 02/26/24  Yes Cobb, Comer GAILS, NP  ibuprofen  (ADVIL ) 200 MG tablet Take 800 mg by mouth every 8 (eight) hours as needed for mild pain (pain score 1-3), headache or cramping.   Yes [provider]  omeprazole (PRILOSEC) 20 MG capsule Take 20 mg by mouth daily before breakfast.   Yes [provider]  OXYGEN Inhale 2 L/min into the lungs See admin instructions. Inhale 2 L/min into the lungs for 30 minutes THREE times a day   Yes [provider]  sodium chloride  HYPERTONIC 3 % nebulizer solution Take by nebulization in the morning and at bedtime. 3 mL each treatment Patient taking differently: Take 4 mLs by nebulization  in the morning and at bedtime. 02/26/24  Yes Cobb, Comer GAILS, NP  tamsulosin  (FLOMAX ) 0.4 MG CAPS capsule Take 0.4 mg by mouth in the morning and at bedtime.   Yes [provider]  amLODipine  (NORVASC ) 5 MG tablet Take 1 tablet (5 mg total) by mouth daily. Patient not taking: Reported on 05/17/2024 12/12/23   Maree Bracken D, DO  predniSONE  (DELTASONE ) 10 MG tablet 4 tabs for 2 days, then 3 tabs for 2 days, 2 tabs for 2 days, then 1  tab for 2 days, then stop Patient not taking: Reported on 05/17/2024 02/26/24   Malachy Comer GAILS, NP   DG Knee Right Port Result Date: 05/18/2024 CLINICAL DATA:  [id number] Closed displaced fracture of right femoral neck (HCC) [id number] EXAM: PORTABLE RIGHT KNEE - 1-2 VIEW COMPARISON:  None Available. FINDINGS: No acute fracture or dislocation. Joint spaces and alignment are relatively maintained. No area of erosion or osseous destruction. No unexpected radiopaque foreign body. Vascular calcifications. IMPRESSION: No acute fracture or dislocation. Electronically Signed   By: Corean Salter M.D.   On: 05/18/2024 08:04   CT Cervical Spine Wo Contrast Result Date: 05/17/2024 EXAM: CT CERVICAL SPINE WITHOUT CONTRAST 05/17/2024 06:36:39 PM TECHNIQUE: CT of the cervical spine was performed without the administration of intravenous contrast. Multiplanar reformatted images are provided for review. Automated exposure control, iterative reconstruction, and/or weight based adjustment of the mA/kV was utilized to reduce the radiation dose to as low as reasonably achievable. COMPARISON: None available. CLINICAL HISTORY: Neck trauma (Age >= 65y). Neck trauma. Age >= 65 years. FINDINGS: BONES AND ALIGNMENT: No acute fracture or traumatic malalignment. DEGENERATIVE CHANGES: Multilevel moderate degenerative changes of the spine. Posterior disc osteophyte complex formation at the C6-C7 level. SOFT TISSUES: No prevertebral soft tissue swelling. IMPRESSION: 1. No evidence of acute traumatic  injury. Electronically signed by: Morgane Naveau MD 05/17/2024 07:20 PM EST RP Workstation: [id number]   CT Head Wo Contrast Result Date: 05/17/2024 EXAM: CT HEAD WITHOUT CONTRAST 05/17/2024 06:36:39 PM TECHNIQUE: CT of the head was performed without the administration of intravenous contrast. Automated exposure control, iterative reconstruction, and/or weight based adjustment of the mA/kV was utilized to reduce the radiation dose to as low as reasonably achievable. COMPARISON: 08/11/2023 CLINICAL HISTORY: Head trauma, minor (Age >= 65 years). FINDINGS: BRAIN AND VENTRICLES: No acute hemorrhage. No evidence of acute infarct. No hydrocephalus. No extra-axial collection. No mass effect or midline shift. Generalized cerebral and cerebellar volume loss. Moderate periventricular white matter disease. ORBITS: No acute abnormality. SINUSES: Mucosal disease within the ethmoid air cells and sphenoid sinus. SOFT TISSUES AND SKULL: No acute soft tissue abnormality. No skull fracture. Mild calcific atheromatous disease within the carotid siphons. IMPRESSION: 1. No acute intracranial abnormality related to the head trauma. Electronically signed by: Morgane Naveau MD 05/17/2024 07:11 PM EST RP Workstation: [id number]   DG Chest 1 View Result Date: 05/17/2024 EXAM: 1 VIEW(S) XRAY OF THE CHEST 05/17/2024 05:47:04 PM COMPARISON: 12/10/2023 CLINICAL HISTORY: Recent fall with known right hip fracture. FINDINGS: LUNGS AND PLEURA: No focal pulmonary opacity. No pleural effusion. No pneumothorax. HEART AND MEDIASTINUM: No acute abnormality of the cardiac and mediastinal silhouettes. BONES AND SOFT TISSUES: No acute osseous abnormality. IMPRESSION: 1. No acute cardiopulmonary pathology. Electronically signed by: Oneil Devonshire MD 05/17/2024 06:23 PM EST RP Workstation: [id number]   DG Hip Unilat W or Wo Pelvis 2-3 Views Right Result Date: 05/17/2024 EXAM: 2 or 3 VIEW(S) XRAY OF THE UNILATERAL HIP 05/17/2024 05:47:04 PM COMPARISON:  None available. CLINICAL HISTORY: Hip pain. FINDINGS: BONES AND JOINTS: Displaced right femoral neck fracture. Small bone fragment medially. Mild varus angulation. SOFT TISSUES: Unremarkable. IMPRESSION: 1. Displaced right femoral neck fracture with mild varus angulation and a small bone fragment medially. Electronically signed by: Oneil Devonshire MD 05/17/2024 06:22 PM EST RP Workstation: [id number]    Positive ROS: All other systems have been reviewed and were otherwise negative with the exception of those mentioned in the HPI and as above.  Physical Exam: General: Alert, no acute distress. On nasal  cannula.  Cardiovascular: No pedal edema Respiratory: No cyanosis, no use of accessory musculature GI: No organomegaly, abdomen is soft and non-tender Skin: No lesions in the area of chief complaint Neurologic: Sensation intact distally Psychiatric: Patient is competent for consent with normal mood and affect Lymphatic: No axillary or cervical lymphadenopathy  MUSCULOSKELETAL:  Examination of the right hip reveals no skin wounds or lesions. ER and shortening noted. No ROM due to known fracture, pain with any movement. TTP over trochanteric region.   Sensation intact to light touch and motor function intact in LE bilaterally including OF, DF, and EHL. Distal pedal pulses 2+ bilaterally. Compartments soft. No significant pedal edema noted.   Assessment: Acute right femoral neck fracture.  COPD   Plan: I discussed the findings with the patient. He has a right femoral neck fracture that will require surgical fixation to allow for immediate pain control and mobilization out of bed. Already admitted to [facility], appreciate their input. Plan for right THA tomorrow. Can have diet today. Hold chemical DVT ppx. All questions solicited and answered.     Valery GORMAN Potters, PA-C    05/18/2024 10:43 AM     [1]  Allergies Allergen Reactions   Statins Other (See Comments)    LEG CRAMPS   Latex Hives and  Rash   Morphine And Codeine Hives   "

## 2024-05-18 NOTE — Progress Notes (Signed)
 Asked to take over by Dr. Duwayne.  Patient admitted for displaced right femoral neck fracture yesterday. Patient requires surgery for pain control and immediate mobilization OOB.  Plan for R THA.  Due to OR schedule, surgery will be tomorrow.  I ordered diet for today, and then NPO after MN tonight.  OTO Lovenox  ordered for this am.  Full consult to follow.

## 2024-05-18 NOTE — Progress Notes (Signed)
" ° ° °  PROCEDURAL EXPEDITER PROGRESS NOTE  Patient Name: Ralph [name]  DOB:[birth year] Date of Admission: 05/17/2024  Date of Assessment:05/18/24   -------------------------------------------------------------------------------------------------------------------   Brief clinical summary: patient going for right total hip arthroplasty on 05-19-24.  Orders in place:   Yes   Communication with surgical team if no orders: none  Labs, test, and orders reviewed: yes  Requires surgical clearance:   No  What type of clearance: n/a  Clearance received: n/a  Barriers noted:none   Intervention provided by [facility] team: none  Barrier resolved:   not applicable   -------------------------------------------------------------------------------------------------------------------  [employer], Ralph [name] Kirks Please contact us  directly via secure chat (search for [facility]) or by calling us  at [phone] [facility]).  "

## 2024-05-18 NOTE — Progress Notes (Addendum)
 " PROGRESS NOTE    Ralph [name]  FMW:[id number] DOB: Dec 28, 1950 DOA: 05/17/2024 PCP: Cleotilde Planas, MD    Chief Complaint  Patient presents with   Fall    Brief Narrative:  Pt is a 74 y/o male with a past medical history of COPD w/ a 10 pack year hx of smoking quit 14 years ago, hyperlipidemia on rosuvastatin  and zetia , hypertension on amlodipine , TIA (08/11/23), GERD and depression. He was admitted to the hospital after slipping on a patch of black ice that he didn't see and falling onto his R side. He denies hitting his head and is not on blood thinners, but has a displaced R hip fracture.     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) -supplement folate and thiamine  -PO ativan  per CIWA protocol  -recently has cut back on drinking and no longer drinks daily -denies experiencing withdrawal symptoms on days when he doesn't drink  COPD -continue nebulizers as prescribed (Bretzi bid, albuterol  prn) -pt is on 2L of oxygen at home -continue pt on 2L nasal cannula   BPH  -continue tamsulosin   Depression -continue lexapro   GERD -hold omeprazole  DVT prophylaxis: Lovenox  30mg  SQ q12hrs, SCDs Code Status: Full Family Communication: No family present at bedside Disposition:   Status is: Inpatient Remains inpatient appropriate because: preoperative   Consultants:  Orthopedics: R THA scheduled for tomorrow    Subjective: Patient says that he is feeling well all things considered. He is in pain because of his hip fracture, but says that it is tolerable when the pain medication  kicks in. Feels like his R thigh is crampy because he's unable to move around.   Objective: Vitals:   05/17/24 2120 05/18/24 0238 05/18/24 0248 05/18/24 0544  BP: (!) 175/103 (!) 171/105  (!) 152/106  Pulse: (!) 103 98  98  Resp: 17 18  18   Temp: 99 F (37.2 C) 98.7 F (37.1 C)  97.8 F (36.6 C)  TempSrc: Oral Oral  Oral  SpO2: 94% (!) 89% 93% 92%  Weight: 93.5 kg     Height: 5' 11.5 (1.816 m)       Intake/Output Summary (Last 24 hours) at 05/18/2024 0751 Last data filed at 05/18/2024 0600 Gross per 24 hour  Intake 120 ml  Output 200 ml  Net -80 ml   Filed Weights   05/17/24 2120  Weight: 93.5 kg    Examination:  General exam: Appears calm and comfortable  Respiratory system: Diminished air movement. Increased respiratory effort with accessory muscle use. Cardiovascular system: S1 & S2 heard, RRR. No murmurs, rubs, gallops or clicks. No pedal edema. Gastrointestinal system: Abdomen is nondistended, soft and nontender. No organomegaly or masses felt.  Central nervous system: Alert and oriented. No focal neurological deficits. Extremities: Limited ROM of R lower extremity. Skin: No rashes, lesions or ulcers Psychiatry: Judgement and insight appear normal. Mood & affect appropriate.     Data Reviewed: I have personally reviewed following labs and imaging studies  CBC: Recent Labs  Lab 05/17/24 1927 05/18/24 0330  WBC 9.5 9.9  NEUTROABS 8.2*  --   HGB 14.3 15.4  HCT 41.1 45.0  MCV 101.7* 102.3*  PLT 181 217    Basic Metabolic Panel: Recent Labs  Lab 05/17/24 1927 05/18/24 0330  NA 142 139  K 3.9 4.8  CL 107 103  CO2 24 18*  GLUCOSE 135* 129*  BUN 12 16  CREATININE 0.89 1.18  CALCIUM  9.1 9.8    GFR: Estimated Creatinine Clearance: 65.7 mL/min (by C-G formula based on SCr of 1.18 mg/dL).  Liver Function Tests: No results for input(s): AST, ALT, ALKPHOS, BILITOT, PROT, ALBUMIN in the last 168 hours.  CBG: No results for input(s):  GLUCAP in the last 168 hours.   Recent Results (from the past 240 hours)  Surgical PCR screen     Status: None   Collection Time: 05/17/24 10:45 PM   Specimen: Nasal Mucosa; Nasal Swab  Result Value Ref Range Status   MRSA, PCR NEGATIVE NEGATIVE Final   Staphylococcus aureus NEGATIVE NEGATIVE Final    Comment: (NOTE) The Xpert SA Assay (FDA approved for NASAL specimens in patients 71 years of age and older), is one component of a comprehensive surveillance program. It is not intended to diagnose infection nor to guide or monitor treatment. Performed at [facility], [address], [place], [place] [address]          Radiology Studies: CT Cervical Spine Wo Contrast Result Date: 05/17/2024 EXAM: CT CERVICAL SPINE WITHOUT CONTRAST 05/17/2024 06:36:39 PM TECHNIQUE: CT of the cervical spine was performed without the administration of intravenous contrast. Multiplanar reformatted images are provided for review. Automated exposure control, iterative reconstruction, and/or weight based adjustment of the mA/kV was utilized to reduce the radiation dose to as low as reasonably achievable. COMPARISON: None available. CLINICAL HISTORY: Neck trauma (Age >= 65y). Neck trauma. Age >= 65 years. FINDINGS: BONES AND ALIGNMENT: No acute fracture or traumatic malalignment. DEGENERATIVE CHANGES: Multilevel moderate degenerative changes of the spine. Posterior disc osteophyte complex formation at the C6-C7 level. SOFT TISSUES: No prevertebral soft tissue swelling. IMPRESSION: 1. No evidence of acute traumatic injury. Electronically signed by: Morgane Naveau MD 05/17/2024 07:20 PM EST RP Workstation: [id number]   CT Head Wo Contrast Result Date: 05/17/2024 EXAM: CT HEAD WITHOUT CONTRAST 05/17/2024 06:36:39 PM TECHNIQUE: CT of the head was performed without the administration of intravenous contrast. Automated exposure control, iterative reconstruction, and/or weight based adjustment of the  mA/kV was utilized to reduce the radiation dose to as low as reasonably achievable. COMPARISON: 08/11/2023 CLINICAL HISTORY: Head trauma, minor (Age >= 65 years). FINDINGS: BRAIN AND VENTRICLES: No acute hemorrhage. No evidence of acute infarct. No hydrocephalus. No extra-axial collection. No mass effect or midline shift. Generalized cerebral and cerebellar volume loss. Moderate periventricular white matter disease. ORBITS: No acute abnormality. SINUSES: Mucosal disease within the ethmoid air cells and sphenoid sinus. SOFT TISSUES AND SKULL: No acute soft tissue abnormality. No skull fracture. Mild calcific atheromatous disease within the carotid siphons. IMPRESSION: 1. No acute intracranial abnormality related to the head trauma. Electronically signed by: Morgane Naveau MD 05/17/2024 07:11 PM EST RP Workstation: [id number]   DG Chest 1 View Result Date: 05/17/2024 EXAM: 1 VIEW(S) XRAY OF THE CHEST 05/17/2024 05:47:04 PM COMPARISON: 12/10/2023 CLINICAL HISTORY: Recent fall with known right hip fracture. FINDINGS: LUNGS AND PLEURA: No focal pulmonary opacity. No pleural effusion. No pneumothorax. HEART AND MEDIASTINUM: No acute abnormality of the cardiac and mediastinal silhouettes. BONES AND SOFT TISSUES: No acute osseous abnormality. IMPRESSION: 1. No acute cardiopulmonary pathology. Electronically signed by: Oneil Devonshire MD 05/17/2024 06:23 PM EST RP Workstation: [id number]  DG Hip Unilat W or Wo Pelvis 2-3 Views Right Result Date: 05/17/2024 EXAM: 2 or 3 VIEW(S) XRAY OF THE UNILATERAL HIP 05/17/2024 05:47:04 PM COMPARISON: None available. CLINICAL HISTORY: Hip pain. FINDINGS: BONES AND JOINTS: Displaced right femoral neck fracture. Small bone fragment medially. Mild varus angulation. SOFT TISSUES: Unremarkable. IMPRESSION: 1. Displaced right femoral neck fracture with mild varus angulation and a small bone fragment medially. Electronically signed by: Oneil Devonshire MD 05/17/2024 06:22 PM EST RP Workstation:  [id number]        Scheduled Meds:  amLODipine   5 mg Oral Daily   budesonide -glycopyrrolate -formoterol   2 puff Inhalation BID   enoxaparin  (LOVENOX ) injection  30 mg Subcutaneous Q12H   escitalopram   20 mg Oral Daily   ezetimibe   10 mg Oral Daily   folic acid   1 mg Oral Daily   multivitamin with minerals  1 tablet Oral Daily   rosuvastatin   40 mg Oral Daily   tamsulosin   0.4 mg Oral QPC supper   thiamine   100 mg Oral Daily   Or   thiamine   100 mg Intravenous Daily   Continuous Infusions:   LOS: 1 day     Yuridia Couts, PA-S [employer]   To contact the attending provider between 7A-7P or the covering provider during after hours 7P-7A, please log into the web site [email or web address] and access using universal [place] password for that web site. If you do not have the password, please call the hospital operator.  05/18/2024, 7:51 AM   "

## 2024-05-18 NOTE — Progress Notes (Signed)
 Pt called out saying that he was wheezing and that he felt like he couldn't breathe. RN walked in room and pt did not have oxygen on face. Oxygen nasal cannula put on pts face, and RN called respiratory therapy for pt to receive a breathing treatment as pt was wheezing. Pts O2 is 96 on 2 L, and HR is 78.  Respiratory therapist at bedside and pt received albuterol . After receiving breathing treatment, pt said he feels better and that symptoms have resolved.

## 2024-05-18 NOTE — Plan of Care (Signed)
   Problem: Nutrition: Goal: Adequate nutrition will be maintained Outcome: Progressing   Problem: Pain Managment: Goal: General experience of comfort will improve and/or be controlled Outcome: Progressing   Problem: Safety: Goal: Ability to remain free from injury will improve Outcome: Progressing

## 2024-05-19 ENCOUNTER — Inpatient Hospital Stay (HOSPITAL_COMMUNITY)

## 2024-05-19 ENCOUNTER — Encounter (HOSPITAL_COMMUNITY): Admission: EM | Payer: Self-pay | Source: Home / Self Care | Attending: Internal Medicine

## 2024-05-19 DIAGNOSIS — S72001D Fracture of unspecified part of neck of right femur, subsequent encounter for closed fracture with routine healing: Secondary | ICD-10-CM | POA: Diagnosis not present

## 2024-05-19 LAB — FERRITIN: Ferritin: 108 ng/mL (ref 24–336)

## 2024-05-19 LAB — COMPREHENSIVE METABOLIC PANEL WITH GFR
ALT: 25 U/L (ref 0–44)
AST: 26 U/L (ref 15–41)
Albumin: 4.3 g/dL (ref 3.5–5.0)
Alkaline Phosphatase: 70 U/L (ref 38–126)
Anion gap: 12 (ref 5–15)
BUN: 17 mg/dL (ref 8–23)
CO2: 25 mmol/L (ref 22–32)
Calcium: 9.8 mg/dL (ref 8.9–10.3)
Chloride: 103 mmol/L (ref 98–111)
Creatinine, Ser: 1.17 mg/dL (ref 0.61–1.24)
GFR, Estimated: >60 mL/min
Glucose, Bld: 188 mg/dL — ABNORMAL HIGH (ref 70–99)
Potassium: 3.9 mmol/L (ref 3.5–5.1)
Sodium: 140 mmol/L (ref 135–145)
Total Bilirubin: 1.1 mg/dL (ref 0.0–1.2)
Total Protein: 6.9 g/dL (ref 6.5–8.1)

## 2024-05-19 LAB — MAGNESIUM: Magnesium: 2.2 mg/dL (ref 1.7–2.4)

## 2024-05-19 LAB — BLOOD GAS, ARTERIAL
Acid-Base Excess: 3.4 mmol/L — ABNORMAL HIGH (ref 0.0–2.0)
Bicarbonate: 28.5 mmol/L — ABNORMAL HIGH (ref 20.0–28.0)
Drawn by: 21338
O2 Saturation: 93.3 %
Patient temperature: 37
pCO2 arterial: 44 mmHg (ref 32–48)
pH, Arterial: 7.42 (ref 7.35–7.45)
pO2, Arterial: 63 mmHg — ABNORMAL LOW (ref 83–108)

## 2024-05-19 LAB — IRON AND TIBC
Iron: 79 ug/dL (ref 45–182)
Saturation Ratios: 21 % (ref 17.9–39.5)
TIBC: 385 ug/dL (ref 250–450)
UIBC: 306 ug/dL

## 2024-05-19 LAB — CBC
HCT: 44.2 % (ref 39.0–52.0)
Hemoglobin: 14.7 g/dL (ref 13.0–17.0)
MCH: 34.8 pg — ABNORMAL HIGH (ref 26.0–34.0)
MCHC: 33.3 g/dL (ref 30.0–36.0)
MCV: 104.5 fL — ABNORMAL HIGH (ref 80.0–100.0)
Platelets: 176 10*3/uL (ref 150–400)
RBC: 4.23 MIL/uL (ref 4.22–5.81)
RDW: 12.6 % (ref 11.5–15.5)
WBC: 10.5 10*3/uL (ref 4.0–10.5)
nRBC: 0 % (ref 0.0–0.2)

## 2024-05-19 LAB — RETICULOCYTES
Immature Retic Fract: 9.3 % (ref 2.3–15.9)
RBC.: 4.14 MIL/uL — ABNORMAL LOW (ref 4.22–5.81)
Retic Count, Absolute: 72.5 10*3/uL (ref 19.0–186.0)
Retic Ct Pct: 1.8 % (ref 0.4–3.1)

## 2024-05-19 LAB — VITAMIN B12: Vitamin B-12: 905 pg/mL (ref 180–914)

## 2024-05-19 LAB — FOLATE: Folate: >20 ng/mL

## 2024-05-19 MED ORDER — ARFORMOTEROL TARTRATE 15 MCG/2ML IN NEBU
15.0000 ug | INHALATION_SOLUTION | Freq: Two times a day (BID) | RESPIRATORY_TRACT | Status: AC
  Administered 2024-05-19 – 2024-05-27 (×17): 15 ug via RESPIRATORY_TRACT
  Filled 2024-05-19 (×17): qty 2

## 2024-05-19 MED ORDER — ASPIRIN 81 MG PO CHEW: 81.0000 mg | CHEWABLE_TABLET | Freq: Two times a day (BID) | ORAL | 0 refills | Status: AC

## 2024-05-19 MED ORDER — BUDESONIDE 0.25 MG/2ML IN SUSP
0.2500 mg | Freq: Two times a day (BID) | RESPIRATORY_TRACT | Status: AC
  Administered 2024-05-19 – 2024-05-27 (×17): 0.25 mg via RESPIRATORY_TRACT
  Filled 2024-05-19 (×17): qty 2

## 2024-05-19 MED ORDER — IPRATROPIUM-ALBUTEROL 0.5-2.5 (3) MG/3ML IN SOLN
3.0000 mL | Freq: Four times a day (QID) | RESPIRATORY_TRACT | Status: AC
  Administered 2024-05-19 – 2024-05-27 (×32): 3 mL via RESPIRATORY_TRACT
  Filled 2024-05-19 (×34): qty 3

## 2024-05-19 MED ORDER — HYDROCODONE-ACETAMINOPHEN 5-325 MG PO TABS: 1.0000 | ORAL_TABLET | ORAL | 0 refills | Status: AC | PRN

## 2024-05-19 MED ORDER — ENSURE PRE-SURGERY PO LIQD
296.0000 mL | Freq: Once | ORAL | Status: AC
  Administered 2024-05-19: 296 mL via ORAL
  Filled 2024-05-19: qty 296

## 2024-05-19 NOTE — Progress Notes (Signed)
 Pts CIWA score is a 9. BP 147/88, HR 99. Per orders Ativan  1 mg to be given. Provider notified and per provider do not give Ativan  at this time.

## 2024-05-19 NOTE — Progress Notes (Signed)
 " PROGRESS NOTE    Ralph [name]  FMW:[id number] DOB: December 05, 1950 DOA: 05/17/2024 PCP: Cleotilde Planas, MD    Chief Complaint  Patient presents with   Fall    Brief Narrative:  Pt is a 74 y/o male with a past medical history of COPD w/ a 10 pack year hx of smoking quit 14 years ago, hyperlipidemia on rosuvastatin  and zetia , hypertension on amlodipine , TIA (08/11/23), GERD and depression. He was admitted to the hospital after slipping on a patch of black ice that he didn't see and falling onto his R side. He denies hitting his head and is not on blood thinners, but has a displaced R hip fracture. Surgery was cancelled for today due to active EtOH withdrawal.    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, CIWA score of 16 this am -PO prn ativan  per CIWA protocol  -hx of withdrawing in a hospital setting (February 2025) -supplement folate and thiamine  -monitor pulse ox -CT head ordered, results pending -says he recently has cut back on drinking and no longer drinks daily -denies experiencing withdrawal symptoms on days when he doesn't drink  Hypertension -continue amlodipine  -follow up with primary care provider about optimizing blood pressure management    Hyperlipidemia -continue zetia  -continue rosuvastatin    COPD -continue nebulizers as prescribed (Bretzi bid, albuterol  prn) -pt is on 2L of oxygen at home -continue pt on 2L nasal cannula    BPH  -continue tamsulosin    Depression -continue lexapro    GERD -hold omeprazole   DVT prophylaxis: SCDs Code Status: Full Family Communication: discussed pt status over the phone with his girlfriend Disposition:   Status is: Inpatient Remains inpatient appropriate because: preoperative   Consultants:  Orthopedics:  scheduled  Antimicrobials:  none    Subjective: Pt is unable to speak coherently, appears to be agitated and in distress  Objective: Vitals:   05/19/24 0702 05/19/24 0805 05/19/24 0806 05/19/24 0908  BP: (!) 181/105 (!) 156/104  (!) 152/104  Pulse: (!) 115 (!) 108  (!) 105  Resp: 18   16  Temp: 98.2 F (36.8 C)   98.7 F (37.1 C)  TempSrc: Oral   Oral  SpO2: 95%  94% 90%  Weight:      Height:        Intake/Output Summary (Last 24 hours) at 05/19/2024 1021 Last data filed at 05/19/2024 0431 Gross per 24 hour  Intake 1016 ml  Output 525 ml  Net 491 ml   Filed Weights   05/17/24 2120  Weight: 93.5 kg    Examination:  General exam: Appears agitated. Snoring and mumbling loudly. Respiratory system: Clear to auscultation. Snoring and accessory muscle use. Cardiovascular system: S1 & S2 heard, RRR. No pedal edema. Central nervous system: Alert and oriented to person only.  Extremities: Strength intact in bilateral UE responsive to commands. Low extremity tremors.  Skin: No rashes, lesions or ulcers Psychiatry: actively withdrawing from alcohol.     Data Reviewed: I have personally reviewed following labs and imaging studies  CBC: Recent Labs  Lab 05/17/24 1927 05/18/24 0330 05/19/24 0334  WBC 9.5 9.9 10.5  NEUTROABS 8.2*  --   --   HGB 14.3 15.4 14.7  HCT 41.1 45.0 44.2  MCV 101.7* 102.3* 104.5*  PLT 181 217 176  Basic Metabolic Panel: Recent Labs  Lab 05/17/24 1927 05/18/24 0330 05/19/24 0334  NA 142 139 140  K 3.9 4.8 3.9  CL 107 103 103  CO2 24 18* 25  GLUCOSE 135* 129* 188*  BUN 12 16 17   CREATININE 0.89 1.18 1.17  CALCIUM  9.1 9.8 9.8  MG  --   --  2.2    GFR: Estimated Creatinine Clearance: 66.3 mL/min (by C-G formula based on SCr of 1.17 mg/dL).  Liver Function Tests: Recent Labs  Lab 05/19/24 0334  AST 26  ALT 25  ALKPHOS 70  BILITOT 1.1  PROT 6.9  ALBUMIN 4.3    CBG: No results for input(s): GLUCAP in the last 168  hours.   Recent Results (from the past 240 hours)  Surgical PCR screen     Status: None   Collection Time: 05/17/24 10:45 PM   Specimen: Nasal Mucosa; Nasal Swab  Result Value Ref Range Status   MRSA, PCR NEGATIVE NEGATIVE Final   Staphylococcus aureus NEGATIVE NEGATIVE Final    Comment: (NOTE) The Xpert SA Assay (FDA approved for NASAL specimens in patients 3 years of age and older), is one component of a comprehensive surveillance program. It is not intended to diagnose infection nor to guide or monitor treatment. Performed at [facility], [address], [place], [place] [address]          Radiology Studies: DG Knee Right Port Result Date: 05/18/2024 CLINICAL DATA:  [id number] Closed displaced fracture of right femoral neck (HCC) [id number] EXAM: PORTABLE RIGHT KNEE - 1-2 VIEW COMPARISON:  None Available. FINDINGS: No acute fracture or dislocation. Joint spaces and alignment are relatively maintained. No area of erosion or osseous destruction. No unexpected radiopaque foreign body. Vascular calcifications. IMPRESSION: No acute fracture or dislocation. Electronically Signed   By: Corean Salter M.D.   On: 05/18/2024 08:04   CT Cervical Spine Wo Contrast Result Date: 05/17/2024 EXAM: CT CERVICAL SPINE WITHOUT CONTRAST 05/17/2024 06:36:39 PM TECHNIQUE: CT of the cervical spine was performed without the administration of intravenous contrast. Multiplanar reformatted images are provided for review. Automated exposure control, iterative reconstruction, and/or weight based adjustment of the mA/kV was utilized to reduce the radiation dose to as low as reasonably achievable. COMPARISON: None available. CLINICAL HISTORY: Neck trauma (Age >= 65y). Neck trauma. Age >= 65 years. FINDINGS: BONES AND ALIGNMENT: No acute fracture or traumatic malalignment. DEGENERATIVE CHANGES: Multilevel moderate degenerative changes of the spine. Posterior disc osteophyte complex formation at the  C6-C7 level. SOFT TISSUES: No prevertebral soft tissue swelling. IMPRESSION: 1. No evidence of acute traumatic injury. Electronically signed by: Morgane Naveau MD 05/17/2024 07:20 PM EST RP Workstation: [id number]   CT Head Wo Contrast Result Date: 05/17/2024 EXAM: CT HEAD WITHOUT CONTRAST 05/17/2024 06:36:39 PM TECHNIQUE: CT of the head was performed without the administration of intravenous contrast. Automated exposure control, iterative reconstruction, and/or weight based adjustment of the mA/kV was utilized to reduce the radiation dose to as low as reasonably achievable. COMPARISON: 08/11/2023 CLINICAL HISTORY: Head trauma, minor (Age >= 65 years). FINDINGS: BRAIN AND VENTRICLES: No acute hemorrhage. No evidence of acute infarct. No hydrocephalus. No extra-axial collection. No mass effect or midline shift. Generalized cerebral and cerebellar volume loss. Moderate periventricular white matter disease. ORBITS: No acute abnormality. SINUSES: Mucosal disease within the ethmoid air cells and sphenoid sinus. SOFT TISSUES AND SKULL: No acute soft tissue abnormality. No skull fracture. Mild calcific atheromatous disease within the carotid siphons. IMPRESSION: 1. No acute intracranial abnormality  related to the head trauma. Electronically signed by: Morgane Naveau MD 05/17/2024 07:11 PM EST RP Workstation: [id number]   DG Chest 1 View Result Date: 05/17/2024 EXAM: 1 VIEW(S) XRAY OF THE CHEST 05/17/2024 05:47:04 PM COMPARISON: 12/10/2023 CLINICAL HISTORY: Recent fall with known right hip fracture. FINDINGS: LUNGS AND PLEURA: No focal pulmonary opacity. No pleural effusion. No pneumothorax. HEART AND MEDIASTINUM: No acute abnormality of the cardiac and mediastinal silhouettes. BONES AND SOFT TISSUES: No acute osseous abnormality. IMPRESSION: 1. No acute cardiopulmonary pathology. Electronically signed by: Oneil Devonshire MD 05/17/2024 06:23 PM EST RP Workstation: [id number]   DG Hip Unilat W or Wo Pelvis 2-3 Views  Right Result Date: 05/17/2024 EXAM: 2 or 3 VIEW(S) XRAY OF THE UNILATERAL HIP 05/17/2024 05:47:04 PM COMPARISON: None available. CLINICAL HISTORY: Hip pain. FINDINGS: BONES AND JOINTS: Displaced right femoral neck fracture. Small bone fragment medially. Mild varus angulation. SOFT TISSUES: Unremarkable. IMPRESSION: 1. Displaced right femoral neck fracture with mild varus angulation and a small bone fragment medially. Electronically signed by: Oneil Devonshire MD 05/17/2024 06:22 PM EST RP Workstation: [id number]        Scheduled Meds:  amLODipine   5 mg Oral Daily   budesonide -glycopyrrolate -formoterol   2 puff Inhalation BID   escitalopram   20 mg Oral Daily   ezetimibe   10 mg Oral Daily   folic acid   1 mg Oral Daily   multivitamin with minerals  1 tablet Oral Daily   rosuvastatin   40 mg Oral Daily   tamsulosin   0.4 mg Oral QPC supper   thiamine   100 mg Oral Daily   Or   thiamine   100 mg Intravenous Daily   Continuous Infusions:   LOS: 2 days     Arabela Basaldua, PA-S [employer]    To contact the attending provider between 7A-7P or the covering provider during after hours 7P-7A, please log into the web site [email or web address] and access using universal [place] password for that web site. If you do not have the password, please call the hospital operator.  05/19/2024, 10:21 AM   "

## 2024-05-19 NOTE — Telephone Encounter (Signed)
 Patient was admitted to [facility] hospital on 05/17/24 after a fall with a hip fracture.  He is currently admitted.  Routing to Dr. Pleas as RICK.  Dr. Pleas, Please see patient message regarding breathing.  He is currently admitted with a hip fracture.  This is just an FYI.  Thank you.

## 2024-05-19 NOTE — Telephone Encounter (Signed)
 Sorry to hear that. I would encourage him to discuss the symptoms with the inpatient doctors if those symptoms are still present. Please move his appointment earlier if he would like.

## 2024-05-19 NOTE — Progress Notes (Signed)
 Surgery cancelled due to active EtOH withdrawal.  Will reschedule.

## 2024-05-19 NOTE — Plan of Care (Signed)
" °  Problem: Safety: Goal: Ability to remain free from injury will improve Outcome: Progressing   Problem: Elimination: Goal: Will not experience complications related to urinary retention Outcome: Progressing   Problem: Clinical Measurements: Goal: Ability to maintain clinical measurements within normal limits will improve Outcome: Progressing   "

## 2024-05-20 DIAGNOSIS — S72001D Fracture of unspecified part of neck of right femur, subsequent encounter for closed fracture with routine healing: Secondary | ICD-10-CM | POA: Diagnosis not present

## 2024-05-20 LAB — CBC
HCT: 41.5 % (ref 39.0–52.0)
Hemoglobin: 13.6 g/dL (ref 13.0–17.0)
MCH: 34.9 pg — ABNORMAL HIGH (ref 26.0–34.0)
MCHC: 32.8 g/dL (ref 30.0–36.0)
MCV: 106.4 fL — ABNORMAL HIGH (ref 80.0–100.0)
Platelets: 166 10*3/uL (ref 150–400)
RBC: 3.9 MIL/uL — ABNORMAL LOW (ref 4.22–5.81)
RDW: 12.7 % (ref 11.5–15.5)
WBC: 11.8 10*3/uL — ABNORMAL HIGH (ref 4.0–10.5)
nRBC: 0 % (ref 0.0–0.2)

## 2024-05-20 LAB — COMPREHENSIVE METABOLIC PANEL WITH GFR
ALT: 23 U/L (ref 0–44)
AST: 28 U/L (ref 15–41)
Albumin: 3.9 g/dL (ref 3.5–5.0)
Alkaline Phosphatase: 60 U/L (ref 38–126)
Anion gap: 10 (ref 5–15)
BUN: 21 mg/dL (ref 8–23)
CO2: 27 mmol/L (ref 22–32)
Calcium: 9.6 mg/dL (ref 8.9–10.3)
Chloride: 101 mmol/L (ref 98–111)
Creatinine, Ser: 1.07 mg/dL (ref 0.61–1.24)
GFR, Estimated: >60 mL/min
Glucose, Bld: 118 mg/dL — ABNORMAL HIGH (ref 70–99)
Potassium: 4.4 mmol/L (ref 3.5–5.1)
Sodium: 138 mmol/L (ref 135–145)
Total Bilirubin: 1.6 mg/dL — ABNORMAL HIGH (ref 0.0–1.2)
Total Protein: 6.9 g/dL (ref 6.5–8.1)

## 2024-05-20 LAB — AMMONIA: Ammonia: 16 umol/L (ref 9–35)

## 2024-05-20 MED ORDER — METHYLPREDNISOLONE SODIUM SUCC 40 MG IJ SOLR
40.0000 mg | INTRAMUSCULAR | Status: DC
  Administered 2024-05-20: 40 mg via INTRAVENOUS
  Filled 2024-05-20: qty 1

## 2024-05-20 NOTE — Progress Notes (Signed)
 " PROGRESS NOTE    Ralph [name]  FMW:[id number] DOB: 12-04-1950 DOA: 05/17/2024 PCP: Cleotilde Planas, MD    Chief Complaint  Patient presents with   Fall    Brief Narrative:  Pt is a 74 y/o male with a past medical history of COPD w/ a 10 pack year hx of smoking quit 14 years ago, hyperlipidemia on rosuvastatin  and zetia , hypertension on amlodipine , TIA (08/11/23), GERD and depression. He was admitted to the hospital after slipping on a patch of black ice that he didn't see and falling onto his R side. He denies hitting his head and is not on blood thinners, but has a displaced R hip fracture. THA was scheduled for 1/29, but was cancelled because patient is in active EtOH withdrawal.    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, CIWA score q 6hrs -has received 8mg  total prn IV ativan  per CIWA protocol (start 1/28, d/c 1/30) -hx of withdrawing in a hospital setting (February 2025) -supplement folate and thiamine  -continue to monitor pulse ox -CT head ordered, no acute findings -says he recently has cut back on drinking and no longer drinks daily, s/o says he drinks more than admitted -ammonia level ordered -repeat CMP -at risk for aspiration pneumonia -- CXR negative for acute findings, CBC ordered to trend WBC count   Hypertension -continue amlodipine  -follow up with primary care provider about optimizing blood pressure management    Hyperlipidemia -continue zetia  -continue rosuvastatin    COPD -continue nebulizers as prescribed (Bretzi bid, albuterol  prn) -pt is on 2L of oxygen at home -continue pt on 3L nasal cannula    BPH  -continue tamsulosin    Depression -continue lexapro    GERD -hold  omeprazole   DVT prophylaxis: SCDs Code Status: Full Family Communication: Discussed pt status and updates with girlfriend yesterday over the phone.  Disposition:   Status is: Inpatient Remains inpatient appropriate because: preoperative    Consultants:  Orthopedics: scheduled for THA 1/29, cancelled due to EtOH withdrawal, plan to reschedule   Antimicrobials:  none    Subjective: Pt is more alert this morning and was able to communicate that he slept like a rock last night. He says he is not experiencing pain in his R hip this morning. He denies chest pain, abdominal pain, and HA. He reports shortness of breath, but that is his baseline and he does not feel that it is worse than normal.   Objective: Vitals:   05/19/24 2353 05/20/24 0119 05/20/24 0516 05/20/24 0605  BP: (!) 165/102 (!) 168/101  (!) 141/89  Pulse: (!) 108 95 82 (!) 47  Resp: 16 (!) 22 (!) 22 18  Temp: 97.7 F (36.5 C) 97.6 F (36.4 C)  98.1 F (36.7 C)  TempSrc: Oral Oral  Oral  SpO2: 95% 94% 100% 96%  Weight:      Height:        Intake/Output Summary (Last 24 hours) at 05/20/2024 0721 Last data filed at 05/20/2024 0600 Gross per 24 hour  Intake 280 ml  Output 700 ml  Net -420 ml   Filed Weights   05/17/24 2120  Weight: 93.5 kg    Examination:  General exam: Appears agitated and restless. Has removed all clothing and blankets.  Full body tremors.  Respiratory system: Moving air well.  Cardiovascular system: S1 & S2 heard, RRR. No murmurs, rubs, gallops or clicks. No pedal edema. Gastrointestinal system: Abdomen is nondistended, soft and nontender. No organomegaly or masses felt.  Central nervous system: Alert and oriented to person, place, and time, but is slow to respond with the correct answers. No focal neurological deficits. Extremities: Symmetric 5 x 5 power in the UE. R leg is unable to move due to femoral neck fracture. Skin: No rashes, lesions or ulcers Psychiatry: Pt is in alcohol  withdrawal. Has difficulty admitting to excessive alcohol use. Very agitated.     Data Reviewed: I have personally reviewed following labs and imaging studies  CBC: Recent Labs  Lab 05/17/24 1927 05/18/24 0330 05/19/24 0334  WBC 9.5 9.9 10.5  NEUTROABS 8.2*  --   --   HGB 14.3 15.4 14.7  HCT 41.1 45.0 44.2  MCV 101.7* 102.3* 104.5*  PLT 181 217 176    Basic Metabolic Panel: Recent Labs  Lab 05/17/24 1927 05/18/24 0330 05/19/24 0334  NA 142 139 140  K 3.9 4.8 3.9  CL 107 103 103  CO2 24 18* 25  GLUCOSE 135* 129* 188*  BUN 12 16 17   CREATININE 0.89 1.18 1.17  CALCIUM  9.1 9.8 9.8  MG  --   --  2.2    GFR: Estimated Creatinine Clearance: 66.3 mL/min (by C-G formula based on SCr of 1.17 mg/dL).  Liver Function Tests: Recent Labs  Lab 05/19/24 0334  AST 26  ALT 25  ALKPHOS 70  BILITOT 1.1  PROT 6.9  ALBUMIN 4.3    CBG: No results for input(s): GLUCAP in the last 168 hours.   Recent Results (from the past 240 hours)  Surgical PCR screen     Status: None   Collection Time: 05/17/24 10:45 PM   Specimen: Nasal Mucosa; Nasal Swab  Result Value Ref Range Status   MRSA, PCR NEGATIVE NEGATIVE Final   Staphylococcus aureus NEGATIVE NEGATIVE Final    Comment: (NOTE) The Xpert SA Assay (FDA approved for NASAL specimens in patients 50 years of age and older), is one component of a comprehensive surveillance program. It is not intended to diagnose infection nor to guide or monitor treatment. Performed at [facility], [address], [place], [place] [address]          Radiology Studies: DG CHEST PORT 1 VIEW Result Date: 05/19/2024 EXAM: 1 VIEW XRAY OF THE CHEST 05/19/2024 03:31:00 PM COMPARISON: 05/17/2024 CLINICAL HISTORY: Hypoxemia. FINDINGS: LUNGS AND PLEURA: Left basilar linear opacity, likely atelectasis. No pleural effusion. No pneumothorax. HEART AND MEDIASTINUM: No acute abnormality of the cardiac and mediastinal silhouettes.  BONES AND SOFT TISSUES: No acute osseous abnormality. IMPRESSION: 1. No acute findings. Electronically signed by: Morgane Naveau MD 05/19/2024 09:34 PM EST RP Workstation: [id number]   CT HEAD WO CONTRAST ( ) Result Date: 05/19/2024 EXAM: CT HEAD WITHOUT CONTRAST 05/19/2024 10:23:18 AM TECHNIQUE: CT of the head was performed without the administration of intravenous contrast. Automated exposure control, iterative reconstruction, and/or weight based adjustment of the mA/kV was utilized to reduce the radiation dose to as low as reasonably achievable. COMPARISON: Comparison from 05/17/2024. CLINICAL HISTORY: Mental status change, unknown cause FINDINGS: BRAIN AND VENTRICLES: No acute hemorrhage. No evidence of acute infarct. No hydrocephalus. No extra-axial collection. No mass effect or midline shift. ORBITS: No acute abnormality. SINUSES: No acute abnormality. SOFT TISSUES AND SKULL: No acute soft tissue abnormality. No skull fracture. IMPRESSION:  1. No acute intracranial abnormality. Electronically signed by: Glendia Molt MD 05/19/2024 11:47 AM EST RP Workstation: [id number]   DG Knee Right Port Result Date: 05/18/2024 CLINICAL DATA:  [id number] Closed displaced fracture of right femoral neck (HCC) [id number] EXAM: PORTABLE RIGHT KNEE - 1-2 VIEW COMPARISON:  None Available. FINDINGS: No acute fracture or dislocation. Joint spaces and alignment are relatively maintained. No area of erosion or osseous destruction. No unexpected radiopaque foreign body. Vascular calcifications. IMPRESSION: No acute fracture or dislocation. Electronically Signed   By: Corean Salter M.D.   On: 05/18/2024 08:04        Scheduled Meds:  amLODipine   5 mg Oral Daily   arformoterol   15 mcg Nebulization BID   budesonide  (PULMICORT ) nebulizer solution  0.25 mg Nebulization BID   escitalopram   20 mg Oral Daily   ezetimibe   10 mg Oral Daily   folic acid   1 mg Oral Daily   ipratropium-albuterol   3 mL Nebulization QID    multivitamin with minerals  1 tablet Oral Daily   rosuvastatin   40 mg Oral Daily   tamsulosin   0.4 mg Oral QPC supper   thiamine   100 mg Oral Daily   Or   thiamine   100 mg Intravenous Daily   Continuous Infusions:   LOS: 3 days    Alexza Norbeck, PA-S [employer]    To contact the attending provider between 7A-7P or the covering provider during after hours 7P-7A, please log into the web site [email or web address] and access using universal [place] password for that web site. If you do not have the password, please call the hospital operator.  05/20/2024, 7:21 AM   "

## 2024-05-20 NOTE — Progress Notes (Signed)
 " PROGRESS NOTE  Ralph [name] FMW:[id number] DOB: [birth year] DOA: 05/17/2024 PCP: Cleotilde Planas, MD   LOS: 3 days   Brief Narrative / Interim history: 74 year old male with history of COPD, prior tobacco use currently in remission, HTN, HLD who came to the hospital with hip pain after falling on a ice pack. He was found to have a hip fracture, orthopedic surgery was consulted and he was admitted to the hospital. Hospital course complicated by ETOH withdrawals   Subjective / 24h Interval events: He is more alert this morning, still with tremors occasionally but much more appropriate than yesterday.  He has less confusion  Assesement and Plan: Principal problem Right hip fracture-orthopedic surgery consulted, initial plans were includes appointment to get surgery 1/29, however he has been having increased confusion in the setting of ETOH withdrawal - Seems more stable, could undergo surgery today if okay with anesthesia, he is improved in terms of his withdrawals   Active problems ETOH abuse, withdrawals, acute metabolic encephalopathy- patient with increased tremors and confusion ovenight 1/28-1/29.  CT scan of the brain was unremarkable, ABG unremarkable -Spoke with patient's wife and daughter yesterday, he has been drinking more than he is admitting to -continue to closely monitor, seems to be improving,   Essential hypertension-continue amlodipine , BP overall stable   Hyperlipidemia-continue statin   History of COPD, hypoxic respiratory failure-stable, started on nebulizers yesterday in the setting of increased confusion.  Has been having worsening breath sounds, tachypneic at times, had to be placed on supplemental oxygen.  Chest x-ray is unremarkable.  He is afebrile, has no leukocytosis, but definitely at risk for aspiration given confusion yesterday   BPH-continue tamsulosin    Depression-continue home medications  Scheduled Meds:  amLODipine   5 mg Oral Daily   arformoterol    15 mcg Nebulization BID   budesonide  (PULMICORT ) nebulizer solution  0.25 mg Nebulization BID   escitalopram   20 mg Oral Daily   ezetimibe   10 mg Oral Daily   folic acid   1 mg Oral Daily   ipratropium-albuterol   3 mL Nebulization QID   multivitamin with minerals  1 tablet Oral Daily   rosuvastatin   40 mg Oral Daily   tamsulosin   0.4 mg Oral QPC supper   thiamine   100 mg Oral Daily   Or   thiamine   100 mg Intravenous Daily   Continuous Infusions: PRN Meds:.acetaminophen  **OR** acetaminophen , albuterol , HYDROmorphone  (DILAUDID ) injection, hydrOXYzine , LORazepam  **OR** LORazepam , ondansetron  **OR** ondansetron  (ZOFRAN ) IV, oxyCODONE , traZODone   Current Outpatient Medications  Medication Instructions   albuterol  (PROVENTIL ) 2.5 mg, Nebulization, Every 6 hours PRN   albuterol  (VENTOLIN  HFA) 108 (90 Base) MCG/ACT inhaler 2 puffs, Inhalation, Every 6 hours PRN   amLODipine  (NORVASC ) 5 mg, Oral, Daily   aspirin  (ASPIRIN  CHILDRENS) 81 mg, Oral, 2 times daily with meals   [Paused] aspirin  EC 81 mg, Oral, Daily, Swallow whole.   Budeson-Glycopyrrol-Formoterol  (BREZTRI  AEROSPHERE) 160-9-4.8 MCG/ACT AERO 2 puffs, Daily   Camphor-Menthol -Methyl Sal 1.2-5.7-6.3 % PTCH 1 patch, Apply externally, Daily PRN   Capsicum, Cayenne, (CAYENNE PEPPER PO) 3 g, Oral, See admin instructions, Mix 3 grams (1/2 teaspoonful) of cayenne pepper into 2 ounces of water  and drink by mouth once a day   escitalopram  (LEXAPRO ) 20 mg, Oral, See admin instructions, Take 20 mg by mouth in the morning and after supper   guaiFENesin  (MUCINEX ) 1,200 mg, Oral, 2 times daily   HYDROcodone -acetaminophen  (NORCO/VICODIN) 5-325 MG tablet 1 tablet, Oral, Every 4 hours PRN   ibuprofen  (ADVIL ) 800 mg, Every  8 hours PRN   omeprazole (PRILOSEC) 20 mg, Daily before breakfast   OXYGEN 2 L/min, Inhalation, See admin instructions, Inhale 2 L/min into the lungs for 30 minutes THREE times a day   predniSONE  (DELTASONE ) 10 MG tablet 4 tabs for 2  days, then 3 tabs for 2 days, 2 tabs for 2 days, then 1 tab for 2 days, then stop   sodium chloride  HYPERTONIC 3 % nebulizer solution Nebulization, 2 times daily, 3 mL each treatment   tamsulosin  (FLOMAX ) 0.4 mg, Oral, 2 times daily    Diet Orders (From admission, onward)     Start     Ordered   05/21/24 0430  Diet NPO time specified  Diet effective ____        05/20/24 0906   05/20/24 0905  Diet regular Room service appropriate? Yes; Fluid consistency: Thin  Diet effective now       Question Answer Comment  Room service appropriate? Yes   Fluid consistency: Thin      05/20/24 0905            DVT prophylaxis: SCDs Start: 05/17/24 1950   Lab Results  Component Value Date   PLT 176 05/19/2024      Code Status: Full Code  Family Communication: No family at bedside this morning  Status is: Inpatient Remains inpatient appropriate because: Severity of illness   Level of care: Med-Surg  Consultants:  Orthopedic surgery  Objective: Vitals:   05/20/24 0605 05/20/24 0739 05/20/24 0740 05/20/24 0741  BP: (!) 141/89     Pulse: (!) 47     Resp: 18     Temp: 98.1 F (36.7 C)     TempSrc: Oral     SpO2: 96% 93% 93% 93%  Weight:      Height:        Intake/Output Summary (Last 24 hours) at 05/20/2024 9092 Last data filed at 05/20/2024 0600 Gross per 24 hour  Intake 280 ml  Output 550 ml  Net -270 ml   Wt Readings from Last 3 Encounters:  05/17/24 93.5 kg  03/02/24 94.9 kg  02/29/24 93.4 kg    Examination:  Constitutional: NAD Eyes: no scleral icterus ENMT: Mucous membranes are moist.  Neck: normal, supple Respiratory: Coarse breath sounds at the bases, scant end expiratory wheezing Cardiovascular: Regular rate and rhythm, no murmurs / rubs / gallops. No LE edema.  Abdomen: non distended, no tenderness. Bowel sounds positive.  Musculoskeletal: no clubbing / cyanosis.    Data Reviewed: I have independently reviewed following labs and imaging studies    CBC Recent Labs  Lab 05/17/24 1927 05/18/24 0330 05/19/24 0334  WBC 9.5 9.9 10.5  HGB 14.3 15.4 14.7  HCT 41.1 45.0 44.2  PLT 181 217 176  MCV 101.7* 102.3* 104.5*  MCH 35.4* 35.0* 34.8*  MCHC 34.8 34.2 33.3  RDW 12.4 12.6 12.6  LYMPHSABS 0.7  --   --   MONOABS 0.6  --   --   EOSABS 0.0  --   --   BASOSABS 0.0  --   --     Recent Labs  Lab 05/17/24 1927 05/18/24 0330 05/19/24 0334  NA 142 139 140  K 3.9 4.8 3.9  CL 107 103 103  CO2 24 18* 25  GLUCOSE 135* 129* 188*  BUN 12 16 17   CREATININE 0.89 1.18 1.17  CALCIUM  9.1 9.8 9.8  AST  --   --  26  ALT  --   --  25  ALKPHOS  --   --  70  BILITOT  --   --  1.1  ALBUMIN  --   --  4.3  MG  --   --  2.2    ------------------------------------------------------------------------------------------------------------------ No results for input(s): CHOL, HDL, LDLCALC, TRIG, CHOLHDL, LDLDIRECT in the last 72 hours.  Lab Results  Component Value Date   HGBA1C 5.2 05/25/2023   ------------------------------------------------------------------------------------------------------------------ No results for input(s): TSH, T4TOTAL, T3FREE, THYROIDAB in the last 72 hours.  Invalid input(s): FREET3  Cardiac Enzymes No results for input(s): CKMB, TROPONINI, MYOGLOBIN in the last 168 hours.  Invalid input(s): CK ------------------------------------------------------------------------------------------------------------------    Component Value Date/Time   BNP 48.0 05/25/2023 0421   BNP 16.1 02/21/2016 1310    CBG: No results for input(s): GLUCAP in the last 168 hours.  Recent Results (from the past 240 hours)  Surgical PCR screen     Status: None   Collection Time: 05/17/24 10:45 PM   Specimen: Nasal Mucosa; Nasal Swab  Result Value Ref Range Status   MRSA, PCR NEGATIVE NEGATIVE Final   Staphylococcus aureus NEGATIVE NEGATIVE Final    Comment: (NOTE) The Xpert SA Assay (FDA  approved for NASAL specimens in patients 19 years of age and older), is one component of a comprehensive surveillance program. It is not intended to diagnose infection nor to guide or monitor treatment. Performed at [facility], [address], [place], [place] [address]      Radiology Studies: DG CHEST PORT 1 VIEW Result Date: 05/19/2024 EXAM: 1 VIEW XRAY OF THE CHEST 05/19/2024 03:31:00 PM COMPARISON: 05/17/2024 CLINICAL HISTORY: Hypoxemia. FINDINGS: LUNGS AND PLEURA: Left basilar linear opacity, likely atelectasis. No pleural effusion. No pneumothorax. HEART AND MEDIASTINUM: No acute abnormality of the cardiac and mediastinal silhouettes. BONES AND SOFT TISSUES: No acute osseous abnormality. IMPRESSION: 1. No acute findings. Electronically signed by: Morgane Naveau MD 05/19/2024 09:34 PM EST RP Workstation: [id number]   CT HEAD WO CONTRAST ( ) Result Date: 05/19/2024 EXAM: CT HEAD WITHOUT CONTRAST 05/19/2024 10:23:18 AM TECHNIQUE: CT of the head was performed without the administration of intravenous contrast. Automated exposure control, iterative reconstruction, and/or weight based adjustment of the mA/kV was utilized to reduce the radiation dose to as low as reasonably achievable. COMPARISON: Comparison from 05/17/2024. CLINICAL HISTORY: Mental status change, unknown cause FINDINGS: BRAIN AND VENTRICLES: No acute hemorrhage. No evidence of acute infarct. No hydrocephalus. No extra-axial collection. No mass effect or midline shift. ORBITS: No acute abnormality. SINUSES: No acute abnormality. SOFT TISSUES AND SKULL: No acute soft tissue abnormality. No skull fracture. IMPRESSION: 1. No acute intracranial abnormality. Electronically signed by: Glendia Molt MD 05/19/2024 11:47 AM EST RP Workstation: [id number]     Nilda Fendt, MD, PhD [facility]  Between 7 am - 7 pm I am available, please contact me via Amion (for emergencies) or Securechat (non urgent  messages)  Between 7 pm - 7 am I am not available, please contact night coverage MD/APP via Amion  "

## 2024-05-20 NOTE — TOC Initial Note (Signed)
 Transition of Care [facility]) - Initial/Assessment Note    Patient Details  Name: Ralph [name] MRN: [id number] Date of Birth: [birth year]  [facility] CM/SW Contact:    NORMAN ASPEN, LCSW Phone Number: 05/20/2024, 4:21 PM  Clinical Narrative:                  Met briefly in room with pt's daughter, however, pt continues in active ETOH withdrawal.  Daughter aware that sx was held due to withdrawals but is very eager to get it done.  Explained that IP CM will follow up again tomorrow but will follow along to assist with dc planning needs.  Expected Discharge Plan:  (TBD) Barriers to Discharge: Continued Medical Work up   Patient Goals and CMS Choice Patient states their goals for this hospitalization and ongoing recovery are:: unable to assess due to AMS          Expected Discharge Plan and Services In-house Referral: Clinical Social Work     Living arrangements for the past 2 months: Single Family Home                                      Prior Living Arrangements/Services Living arrangements for the past 2 months: Single Family Home Lives with:: Significant Other Patient language and need for interpreter reviewed:: Yes        Need for Family Participation in Patient Care: Yes (Comment) Care giver support system in place?: Yes (comment)   Criminal Activity/Legal Involvement Pertinent to Current Situation/Hospitalization: No - Comment as needed  Activities of Daily Living   ADL Screening (condition at time of admission) Independently performs ADLs?: Yes (appropriate for developmental age) Is the patient deaf or have difficulty hearing?: Yes Does the patient have difficulty seeing, even when wearing glasses/contacts?: No Does the patient have difficulty concentrating, remembering, or making decisions?: Yes (pt reports short term sometimes)  Permission Sought/Granted                  Emotional Assessment Appearance:: Appears stated  age Attitude/Demeanor/Rapport: Unable to Assess Affect (typically observed): Unable to Assess Orientation: : Oriented to Self Alcohol / Substance Use: Alcohol Use Psych Involvement: No (comment)  Admission diagnosis:  Closed right hip fracture (HCC) [S72.001A] Closed fracture of neck of right femur, initial encounter (HCC) [S72.001A] Patient Active Problem List   Diagnosis Date Noted   Closed right hip fracture (HCC) 05/17/2024   Mycobacterium infection, 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   Vocal cord dysfunction 02/26/2024   GERD (gastroesophageal reflux disease) 02/26/2024   Tobacco abuse 01/13/2024   Chronic respiratory failure with hypoxia (HCC) 12/11/2023   HLD (hyperlipidemia) 12/11/2023   Alcohol abuse 12/11/2023   Macrocytosis 12/11/2023   Chest pain 12/11/2023   TIA (transient ischemic attack) 08/11/2023   Malnutrition of moderate degree 05/30/2023   Respiratory arrest (HCC) 05/25/2023   Abnormal CT of the chest 09/14/2016   COPD with acute exacerbation (HCC) 09/12/2016   DOE (dyspnea on exertion) 01/18/2016   Excessive daytime sleepiness 01/18/2016   Essential hypertension 01/18/2016   PCP:  Cleotilde Planas, MD Pharmacy:   [id number] - [place], [place] - [address] AT [facility] [address] [place] [address] Phone: [phone] Fax: [phone]     Social Drivers of Health (SDOH) Social History: SDOH Screenings   Food Insecurity: No Food Insecurity (05/17/2024)  Housing: Low Risk (05/17/2024)  Transportation Needs: No Transportation Needs (05/17/2024)  Utilities: Not At Risk (05/17/2024)  Social Connections: Moderately Integrated (05/17/2024)  Tobacco Use: Medium Risk (05/17/2024)   SDOH Interventions:     Readmission Risk Interventions    05/20/2024    4:20 PM 06/09/2023    9:53 AM  Readmission Risk Prevention Plan  Post Dischage Appt Complete   Medication Screening Complete   Transportation Screening  Complete Complete  PCP or Specialist Appt within 5-7 Days  Complete  Home Care Screening  Complete  Medication Review (RN CM)  Referral to Pharmacy

## 2024-05-20 NOTE — Plan of Care (Signed)
" °  Problem: Safety: Goal: Ability to remain free from injury will improve Outcome: Progressing   Problem: Elimination: Goal: Will not experience complications related to urinary retention Outcome: Progressing   Problem: Coping: Goal: Level of anxiety will decrease Outcome: Progressing   Problem: Clinical Measurements: Goal: Ability to maintain clinical measurements within normal limits will improve Outcome: Progressing   "

## 2024-05-20 NOTE — Progress Notes (Signed)
 Patient ID: Ralph [name], male   DOB: January 28, 1951, 74 y.o.   MRN: [id number]  Based on current state with regards to ETOH withdrawal and his case being cancelled yesterday I was asked to assist with management this weekend when I am on call.  X-rays reveal displaced femoral neck fracture  Plan is still for a right THR to address this injury Plan is for Sunday unless withdrawal issues persist. We will follow for medical stabilization/clearance  Regular diet today and tomorrow Orders will be placed for OR Sunday

## 2024-05-21 MED ORDER — SODIUM CHLORIDE (PF) 0.9 % IJ SOLN
INTRAMUSCULAR | Status: AC
  Filled 2024-05-21: qty 50

## 2024-05-21 MED ORDER — LORAZEPAM 2 MG/ML IJ SOLN
1.0000 mg | INTRAMUSCULAR | Status: AC | PRN
  Administered 2024-05-21: 2 mg via INTRAVENOUS
  Administered 2024-05-21: 1 mg via INTRAVENOUS
  Filled 2024-05-21: qty 2
  Filled 2024-05-21 (×2): qty 1

## 2024-05-21 MED ORDER — LORAZEPAM 1 MG PO TABS: 1.0000 mg | ORAL_TABLET | ORAL | Status: DC | PRN

## 2024-05-21 MED ORDER — PHENOBARBITAL SODIUM 130 MG/ML IJ SOLN: 130.0000 mg | Freq: Three times a day (TID) | INTRAMUSCULAR | Status: DC

## 2024-05-21 MED ORDER — ORAL CARE MOUTH RINSE: 15.0000 mL | OROMUCOSAL | Status: DC | PRN

## 2024-05-21 MED ORDER — BUPIVACAINE-EPINEPHRINE (PF) 0.25% -1:200000 IJ SOLN
INTRAMUSCULAR | Status: AC
  Filled 2024-05-21: qty 30

## 2024-05-21 MED ORDER — CHLORHEXIDINE GLUCONATE CLOTH 2 % EX PADS
6.0000 | MEDICATED_PAD | Freq: Every day | CUTANEOUS | Status: AC
  Administered 2024-05-22 – 2024-05-27 (×6): 6 via TOPICAL

## 2024-05-21 MED ORDER — LORAZEPAM 2 MG/ML IJ SOLN
1.0000 mg | INTRAMUSCULAR | Status: DC | PRN
  Administered 2024-05-21 (×2): 2 mg via INTRAVENOUS
  Filled 2024-05-21: qty 2
  Filled 2024-05-21: qty 1

## 2024-05-21 MED ORDER — SODIUM CHLORIDE 0.9 % IV SOLN
520.0000 mg | Freq: Once | INTRAVENOUS | Status: AC
  Administered 2024-05-22: 520 mg via INTRAVENOUS
  Filled 2024-05-21: qty 4

## 2024-05-21 MED ORDER — HALOPERIDOL LACTATE 5 MG/ML IJ SOLN
2.0000 mg | Freq: Four times a day (QID) | INTRAMUSCULAR | Status: AC | PRN
  Administered 2024-05-21 – 2024-05-23 (×3): 2 mg via INTRAVENOUS
  Administered 2024-05-24: 5 mg via INTRAVENOUS
  Filled 2024-05-21 (×5): qty 1

## 2024-05-21 MED ORDER — CHLORDIAZEPOXIDE HCL 25 MG PO CAPS: 25.0000 mg | ORAL_CAPSULE | Freq: Once | ORAL | Status: DC

## 2024-05-21 MED ORDER — LORAZEPAM 1 MG PO TABS: 1.0000 mg | ORAL_TABLET | ORAL | Status: AC | PRN

## 2024-05-21 MED ORDER — PHENOBARBITAL SODIUM 65 MG/ML IJ SOLN: 65.0000 mg | Freq: Three times a day (TID) | INTRAMUSCULAR | Status: DC

## 2024-05-21 MED ORDER — KETOROLAC TROMETHAMINE 30 MG/ML IJ SOLN
INTRAMUSCULAR | Status: AC
  Filled 2024-05-21: qty 1

## 2024-05-21 MED ORDER — DEXMEDETOMIDINE HCL IN NACL 200 MCG/50ML IV SOLN
0.0000 ug/kg/h | INTRAVENOUS | Status: DC
  Administered 2024-05-21: 0.4 ug/kg/h via INTRAVENOUS
  Administered 2024-05-22: 0.3 ug/kg/h via INTRAVENOUS
  Administered 2024-05-22: 0.6 ug/kg/h via INTRAVENOUS
  Administered 2024-05-23 (×2): 1.2 ug/kg/h via INTRAVENOUS
  Administered 2024-05-23: 0.4 ug/kg/h via INTRAVENOUS
  Administered 2024-05-23 (×3): 1.2 ug/kg/h via INTRAVENOUS
  Filled 2024-05-21: qty 50
  Filled 2024-05-21 (×2): qty 100
  Filled 2024-05-21 (×5): qty 50

## 2024-05-21 MED ORDER — METHYLPREDNISOLONE SODIUM SUCC 40 MG IJ SOLR
40.0000 mg | INTRAMUSCULAR | Status: AC
  Administered 2024-05-21: 40 mg via INTRAVENOUS
  Filled 2024-05-21: qty 1

## 2024-05-21 NOTE — Progress Notes (Signed)
 Rapid Response Event Note   Reason for Call : pt is withdrawing    Initial Focused Assessment: Pt arouses to answer simple questions.  Pt is agitated with stimulation.  Tremors and sweats noted.  Lung sounds rhonchi.  Pt follows simple commands. Pt denies pain. VS in flowsheet.   Interventions: [facility], NP made aware new orders received and initiated.    Plan of Care: Pt will remain in current location,  RN to re access.     Event Summary:   MD Notified: yes Call Time: 2013 Arrival Time: 2025 End Time: 2111  Ebony Rickel Lavern, RN

## 2024-05-21 NOTE — Progress Notes (Signed)
 Patient ID: Ralph [name], male   DOB: 13-Nov-1950, 74 y.o.   MRN: [id number]  Right hip femoral neck fracture with improving alcohol withdrawal symptoms per medicine. Plan to proceed with right total hip replacement tomorrow, 05/22/2024. Preoperative orders have been in place.  Consent order changed to me for procedure N.p.o. order placed for after midnight tonight for surgery tomorrow

## 2024-05-21 NOTE — Progress Notes (Signed)
 Came diaphoretic hypertensive and tachycardic postoperatively.  Ativan  CIWA reordered.

## 2024-05-21 NOTE — Consult Note (Incomplete)
 "  NAME:  Ralph [name], MRN:  [id number], DOB:  1950/11/20, LOS: 4 ADMISSION DATE:  05/17/2024, CONSULTATION DATE:  05/22/24 REFERRING MD:  TRH, CHIEF COMPLAINT:  hip pain   History of Present Illness:  74 year old man w/ hx of COPD, HTN, HLD, etoh abuse who presented after mechanical fall found to have R femoral neck fx tentatively planned for THA tomorrow.  Unfortunately pretty bad EtOH w/d despite ativan  so PCCM consulted to assist.  Patient is moaning and wiggling around in bed non-redirectable so hx per chart review.  Pertinent  Medical History   Past Medical History:  Diagnosis Date   Arthritis    hands   Asthma    GERD (gastroesophageal reflux disease)    History of kidney stones    x2  -episodes   Hyperlipidemia    Renal disorder    Varicose veins    bilateral surgeries- no problems now     Significant Hospital Events: Including procedures, antibiotic start and stop dates in addition to other pertinent events   1/27 admit 1/31 PCCM admit  Interim History / Subjective:  consult  Objective    Blood pressure 136/81, pulse (!) 106, temperature 97.8 F (36.6 C), resp. rate (!) 24, height 5' 11.5 (1.816 m), weight 93.5 kg, SpO2 (!) 87%.        Intake/Output Summary (Last 24 hours) at 05/21/2024 2337 Last data filed at 05/21/2024 0300 Gross per 24 hour  Intake 60 ml  Output 450 ml  Net -390 ml   Filed Weights   05/17/24 2120  Weight: 93.5 kg    Examination: General: agitated HENT: MM dry, trachea midline Lungs: tachypneic but nonlabored (more related to agitation), some transmitted upper airway sounds c/w retained oropharyngeal secretions Cardiovascular: tachy, some ectopy on tele Abdomen: soft, +BS Extremities: dont really see any deformities even on R hip Neuro: moving everything not to command, making sounds and occasional curse words GU: male purewick in place  No new labs  Resolved problem list   Assessment and Plan  Acute metabolic  encephalopathy- timing, history and response to therapy are c/w delirium tremens. R femoral neck fracture- pain from this complicating above; I understand the worse THA outcomes seen with those in alcohol withdrawal but is withholding emergent surgery while clearing Dts really lead to better outcomes? HTN/COPD/BPH/Depression- per primary  Phenobarb load and taper Can do precedex  while phenobarb takes effect If stable off precedex  in am will be available PRN Hopefully can get surgery soon   Labs   CBC: Recent Labs  Lab 05/17/24 1927 05/18/24 0330 05/19/24 0334 05/20/24 1050  WBC 9.5 9.9 10.5 11.8*  NEUTROABS 8.2*  --   --   --   HGB 14.3 15.4 14.7 13.6  HCT 41.1 45.0 44.2 41.5  MCV 101.7* 102.3* 104.5* 106.4*  PLT 181 217 176 166    Basic Metabolic Panel: Recent Labs  Lab 05/17/24 1927 05/18/24 0330 05/19/24 0334 05/20/24 1050  NA 142 139 140 138  K 3.9 4.8 3.9 4.4  CL 107 103 103 101  CO2 24 18* 25 27  GLUCOSE 135* 129* 188* 118*  BUN 12 16 17 21   CREATININE 0.89 1.18 1.17 1.07  CALCIUM  9.1 9.8 9.8 9.6  MG  --   --  2.2  --    GFR: Estimated Creatinine Clearance: 72.4 mL/min (by C-G formula based on SCr of 1.07 mg/dL). Recent Labs  Lab 05/17/24 1927 05/18/24 0330 05/19/24 0334 05/20/24 1050  WBC 9.5  9.9 10.5 11.8*    Liver Function Tests: Recent Labs  Lab 05/19/24 0334 05/20/24 1050  AST 26 28  ALT 25 23  ALKPHOS 70 60  BILITOT 1.1 1.6*  PROT 6.9 6.9  ALBUMIN 4.3 3.9   No results for input(s): LIPASE, AMYLASE in the last 168 hours. Recent Labs  Lab 05/20/24 1050  AMMONIA 16    ABG    Component Value Date/Time   PHART 7.42 05/19/2024 0750   PCO2ART 44 05/19/2024 0750   PO2ART 63 (L) 05/19/2024 0750   HCO3 28.5 (H) 05/19/2024 0750   TCO2 22 08/11/2023 1205   ACIDBASEDEF 3.0 (H) 05/25/2023 0251   O2SAT 93.3 05/19/2024 0750     Coagulation Profile: No results for input(s): INR, PROTIME in the last 168 hours.  Cardiac  Enzymes: No results for input(s): CKTOTAL, CKMB, CKMBINDEX, TROPONINI in the last 168 hours.  HbA1C: Hgb A1c MFr Bld  Date/Time Value Ref Range Status  05/25/2023 04:21 AM 5.2 4.8 - 5.6 % Final    Comment:    (NOTE) Pre diabetes:          5.7%-6.4%  Diabetes:              >6.4%  Glycemic control for   <7.0% adults with diabetes     CBG: No results for input(s): GLUCAP in the last 168 hours.  Review of Systems:   Too encephalopathic  Past Medical History:  He,  has a past medical history of Arthritis, Asthma, GERD (gastroesophageal reflux disease), History of kidney stones, Hyperlipidemia, Renal disorder, and Varicose veins.   Surgical History:   Past Surgical History:  Procedure Laterality Date   COLONOSCOPY WITH PROPOFOL  N/A 03/20/2015   Procedure: COLONOSCOPY WITH PROPOFOL ;  Surgeon: Gladis MARLA Louder, MD;  Location: [facility];  Service: Endoscopy;  Laterality: N/A;   HERNIA REPAIR     inguinal hernia   TONSILLECTOMY     VARICOSE VEIN SURGERY Bilateral    2 years ago   VASECTOMY       Social History:   reports that he quit smoking about 11 years ago. His smoking use included cigarettes. He started smoking about 31 years ago. He has a 10 pack-year smoking history. He has never used smokeless tobacco. He reports current alcohol use. He reports that he does not use drugs.   Family History:  His family history includes COPD in his father; Dementia in his mother.   Allergies Allergies[1]   Home Medications  Prior to Admission medications  Medication Sig Start Date End Date Taking? Authorizing Provider  albuterol  (PROVENTIL ) (2.5 MG/3ML) 0.083% nebulizer solution Take 3 mLs (2.5 mg total) by nebulization every 6 (six) hours as needed for wheezing or shortness of breath. 02/26/24 02/25/25 Yes Cobb, Comer GAILS, NP  albuterol  (VENTOLIN  HFA) 108 (90 Base) MCG/ACT inhaler Inhale 2 puffs into the lungs every 6 (six) hours as needed for wheezing or shortness of  breath.   Yes [provider]  aspirin  (ASPIRIN  CHILDRENS) 81 MG chewable tablet Chew 1 tablet (81 mg total) by mouth 2 (two) times daily with a meal. 05/19/24 07/03/24 Yes Hill, Valery RAMAN, PA-C  [Paused] aspirin  EC 81 MG tablet Take 1 tablet (81 mg total) by mouth daily. Swallow whole. Wait to take this until your doctor or other care provider tells you to start again. 12/12/23  Yes Shah, Pratik D, DO  Budeson-Glycopyrrol-Formoterol  (BREZTRI  AEROSPHERE) 160-9-4.8 MCG/ACT AERO Inhale 2 puffs into the lungs daily.   Yes [provider]  Camphor-Menthol -Methyl Sal 1.2-5.7-6.3 % PTCH Apply 1 patch topically daily as needed (bilateral hip pain).   Yes [provider]  Capsicum, Cayenne, (CAYENNE PEPPER PO) Take 3 g by mouth See admin instructions. Mix 3 grams (1/2 teaspoonful) of cayenne pepper into 2 ounces of water  and drink by mouth once a day   Yes [provider]  escitalopram  (LEXAPRO ) 20 MG tablet Take 20 mg by mouth See admin instructions. Take 20 mg by mouth in the morning and after supper   Yes [provider]  guaiFENesin  (MUCINEX ) 600 MG 12 hr tablet Take 2 tablets (1,200 mg total) by mouth 2 (two) times daily. Patient taking differently: Take 600 mg by mouth 2 (two) times daily. 02/26/24  Yes Cobb, Comer GAILS, NP  HYDROcodone -acetaminophen  (NORCO/VICODIN) 5-325 MG tablet Take 1 tablet by mouth every 4 (four) hours as needed for up to 7 days for moderate pain (pain score 4-6) or severe pain (pain score 7-10). 05/19/24 05/26/24 Yes Hill, Valery RAMAN, PA-C  ibuprofen  (ADVIL ) 200 MG tablet Take 800 mg by mouth every 8 (eight) hours as needed for mild pain (pain score 1-3), headache or cramping.   Yes [provider]  omeprazole (PRILOSEC) 20 MG capsule Take 20 mg by mouth daily before breakfast.   Yes [provider]  OXYGEN Inhale 2 L/min into the lungs See admin instructions. Inhale 2 L/min into the lungs for 30 minutes THREE times a day   Yes  [provider]  sodium chloride  HYPERTONIC 3 % nebulizer solution Take by nebulization in the morning and at bedtime. 3 mL each treatment Patient taking differently: Take 4 mLs by nebulization in the morning and at bedtime. 02/26/24  Yes Cobb, Comer GAILS, NP  tamsulosin  (FLOMAX ) 0.4 MG CAPS capsule Take 0.4 mg by mouth in the morning and at bedtime.   Yes [provider]  amLODipine  (NORVASC ) 5 MG tablet Take 1 tablet (5 mg total) by mouth daily. Patient not taking: Reported on 05/17/2024 12/12/23   Maree Bracken D, DO  predniSONE  (DELTASONE ) 10 MG tablet 4 tabs for 2 days, then 3 tabs for 2 days, 2 tabs for 2 days, then 1 tab for 2 days, then stop Patient not taking: Reported on 05/17/2024 02/26/24   Malachy Comer GAILS, NP     Critical care time: N/A              [1]  Allergies Allergen Reactions   Statins Other (See Comments)    LEG CRAMPS   Latex Hives and Rash   Morphine And Codeine Hives   "

## 2024-05-21 NOTE — Plan of Care (Signed)
   Problem: Health Behavior/Discharge Planning: Goal: Ability to manage health-related needs will improve Outcome: Progressing   Problem: Clinical Measurements: Goal: Ability to maintain clinical measurements within normal limits will improve Outcome: Progressing   Problem: Clinical Measurements: Goal: Will remain free from infection Outcome: Progressing

## 2024-05-22 ENCOUNTER — Inpatient Hospital Stay (HOSPITAL_COMMUNITY)

## 2024-05-22 ENCOUNTER — Encounter (HOSPITAL_COMMUNITY): Admission: EM | Payer: Self-pay | Source: Home / Self Care | Attending: Internal Medicine

## 2024-05-22 ENCOUNTER — Inpatient Hospital Stay (HOSPITAL_COMMUNITY): Admitting: Certified Registered"

## 2024-05-22 DIAGNOSIS — J449 Chronic obstructive pulmonary disease, unspecified: Secondary | ICD-10-CM | POA: Diagnosis not present

## 2024-05-22 DIAGNOSIS — G9341 Metabolic encephalopathy: Secondary | ICD-10-CM

## 2024-05-22 DIAGNOSIS — J9601 Acute respiratory failure with hypoxia: Secondary | ICD-10-CM

## 2024-05-22 DIAGNOSIS — Z87891 Personal history of nicotine dependence: Secondary | ICD-10-CM | POA: Diagnosis not present

## 2024-05-22 DIAGNOSIS — S72001A Fracture of unspecified part of neck of right femur, initial encounter for closed fracture: Secondary | ICD-10-CM

## 2024-05-22 DIAGNOSIS — Z96641 Presence of right artificial hip joint: Secondary | ICD-10-CM

## 2024-05-22 DIAGNOSIS — I10 Essential (primary) hypertension: Secondary | ICD-10-CM | POA: Diagnosis not present

## 2024-05-22 DIAGNOSIS — F10931 Alcohol use, unspecified with withdrawal delirium: Secondary | ICD-10-CM

## 2024-05-22 LAB — CBC
HCT: 40 % (ref 39.0–52.0)
Hemoglobin: 13.8 g/dL (ref 13.0–17.0)
MCH: 35.3 pg — ABNORMAL HIGH (ref 26.0–34.0)
MCHC: 34.5 g/dL (ref 30.0–36.0)
MCV: 102.3 fL — ABNORMAL HIGH (ref 80.0–100.0)
Platelets: 200 10*3/uL (ref 150–400)
RBC: 3.91 MIL/uL — ABNORMAL LOW (ref 4.22–5.81)
RDW: 12.3 % (ref 11.5–15.5)
WBC: 9.7 10*3/uL (ref 4.0–10.5)
nRBC: 0.2 % (ref 0.0–0.2)

## 2024-05-22 LAB — COMPREHENSIVE METABOLIC PANEL WITH GFR
ALT: 36 U/L (ref 0–44)
AST: 44 U/L — ABNORMAL HIGH (ref 15–41)
Albumin: 3.9 g/dL (ref 3.5–5.0)
Alkaline Phosphatase: 58 U/L (ref 38–126)
Anion gap: 11 (ref 5–15)
BUN: 37 mg/dL — ABNORMAL HIGH (ref 8–23)
CO2: 28 mmol/L (ref 22–32)
Calcium: 9.6 mg/dL (ref 8.9–10.3)
Chloride: 106 mmol/L (ref 98–111)
Creatinine, Ser: 0.93 mg/dL (ref 0.61–1.24)
GFR, Estimated: >60 mL/min
Glucose, Bld: 135 mg/dL — ABNORMAL HIGH (ref 70–99)
Potassium: 4.1 mmol/L (ref 3.5–5.1)
Sodium: 145 mmol/L (ref 135–145)
Total Bilirubin: 0.9 mg/dL (ref 0.0–1.2)
Total Protein: 6.7 g/dL (ref 6.5–8.1)

## 2024-05-22 LAB — BLOOD GAS, ARTERIAL
Acid-Base Excess: 5.2 mmol/L — ABNORMAL HIGH (ref 0.0–2.0)
Bicarbonate: 31.1 mmol/L — ABNORMAL HIGH (ref 20.0–28.0)
Drawn by: 331471
FIO2: 100 %
MECHVT: 610 mL
O2 Saturation: 100 %
PEEP: 5 cmH2O
Patient temperature: 37
RATE: 15 {breaths}/min
pCO2 arterial: 48 mmHg (ref 32–48)
pH, Arterial: 7.42 (ref 7.35–7.45)
pO2, Arterial: 249 mmHg — ABNORMAL HIGH (ref 83–108)

## 2024-05-22 LAB — GLUCOSE, CAPILLARY
Glucose-Capillary: 132 mg/dL — ABNORMAL HIGH (ref 70–99)
Glucose-Capillary: 138 mg/dL — ABNORMAL HIGH (ref 70–99)
Glucose-Capillary: 169 mg/dL — ABNORMAL HIGH (ref 70–99)

## 2024-05-22 LAB — MRSA NEXT GEN BY PCR, NASAL: MRSA by PCR Next Gen: NOT DETECTED

## 2024-05-22 LAB — MAGNESIUM: Magnesium: 2.8 mg/dL — ABNORMAL HIGH (ref 1.7–2.4)

## 2024-05-22 MED ORDER — SODIUM CHLORIDE 0.9 % IV SOLN
2.0000 g | INTRAVENOUS | Status: AC
  Administered 2024-05-22 – 2024-05-27 (×6): 2 g via INTRAVENOUS
  Filled 2024-05-22 (×6): qty 20

## 2024-05-22 MED ORDER — ORAL CARE MOUTH RINSE: 15.0000 mL | OROMUCOSAL | Status: DC | PRN

## 2024-05-22 MED ORDER — POLYETHYLENE GLYCOL 3350 17 G PO PACK
17.0000 g | PACK | Freq: Two times a day (BID) | ORAL | Status: AC
  Administered 2024-05-22 – 2024-05-27 (×10): 17 g
  Filled 2024-05-22 (×10): qty 1

## 2024-05-22 MED ORDER — PROPOFOL 1000 MG/100ML IV EMUL
0.0000 ug/kg/min | INTRAVENOUS | Status: AC
  Administered 2024-05-22: 20 ug/kg/min via INTRAVENOUS
  Administered 2024-05-22: 25 ug/kg/min via INTRAVENOUS
  Administered 2024-05-23: 20 ug/kg/min via INTRAVENOUS
  Administered 2024-05-23: 25 ug/kg/min via INTRAVENOUS
  Administered 2024-05-24: 10 ug/kg/min via INTRAVENOUS
  Administered 2024-05-24 (×2): 30 ug/kg/min via INTRAVENOUS
  Administered 2024-05-25 (×2): 20 ug/kg/min via INTRAVENOUS
  Administered 2024-05-26: 25 ug/kg/min via INTRAVENOUS
  Administered 2024-05-26: 30 ug/kg/min via INTRAVENOUS
  Administered 2024-05-26: 40 ug/kg/min via INTRAVENOUS
  Administered 2024-05-26 (×2): 50 ug/kg/min via INTRAVENOUS
  Administered 2024-05-27: 25 ug/kg/min via INTRAVENOUS
  Administered 2024-05-27: 35 ug/kg/min via INTRAVENOUS
  Administered 2024-05-27: 30 ug/kg/min via INTRAVENOUS
  Administered 2024-05-27: 20 ug/kg/min via INTRAVENOUS
  Filled 2024-05-22 (×11): qty 100
  Filled 2024-05-22: qty 200
  Filled 2024-05-22 (×5): qty 100

## 2024-05-22 MED ORDER — ASPIRIN 81 MG PO CHEW
81.0000 mg | CHEWABLE_TABLET | Freq: Two times a day (BID) | ORAL | Status: AC
  Administered 2024-05-23 – 2024-05-27 (×10): 81 mg
  Filled 2024-05-22 (×10): qty 1

## 2024-05-22 MED ORDER — ALUM & MAG HYDROXIDE-SIMETH 200-200-20 MG/5ML PO SUSP: 30.0000 mL | ORAL | Status: DC | PRN

## 2024-05-22 MED ORDER — ESCITALOPRAM OXALATE 20 MG PO TABS
20.0000 mg | ORAL_TABLET | Freq: Every day | ORAL | Status: AC
  Administered 2024-05-23 – 2024-05-27 (×5): 20 mg
  Filled 2024-05-22 (×5): qty 1

## 2024-05-22 MED ORDER — ROCURONIUM BROMIDE 10 MG/ML (PF) SYRINGE
PREFILLED_SYRINGE | INTRAVENOUS | Status: AC
  Filled 2024-05-22: qty 10

## 2024-05-22 MED ORDER — OXYCODONE HCL 5 MG PO TABS: 5.0000 mg | ORAL_TABLET | ORAL | Status: AC | PRN

## 2024-05-22 MED ORDER — LACTATED RINGERS IV SOLN: INTRAVENOUS | Status: DC | PRN

## 2024-05-22 MED ORDER — CEFAZOLIN SODIUM-DEXTROSE 2-4 GM/100ML-% IV SOLN
2.0000 g | Freq: Four times a day (QID) | INTRAVENOUS | Status: AC
  Administered 2024-05-22 – 2024-05-23 (×2): 2 g via INTRAVENOUS
  Filled 2024-05-22 (×2): qty 100

## 2024-05-22 MED ORDER — FENTANYL CITRATE (PF) 100 MCG/2ML IJ SOLN
INTRAMUSCULAR | Status: DC | PRN
  Administered 2024-05-22: 100 ug via INTRAVENOUS
  Administered 2024-05-22: 50 ug via INTRAVENOUS
  Administered 2024-05-22: 100 ug via INTRAVENOUS

## 2024-05-22 MED ORDER — TRANEXAMIC ACID-NACL 1000-0.7 MG/100ML-% IV SOLN
INTRAVENOUS | Status: AC
  Filled 2024-05-22: qty 100

## 2024-05-22 MED ORDER — SODIUM CHLORIDE (PF) 0.9 % IJ SOLN
INTRAMUSCULAR | Status: DC | PRN
  Administered 2024-05-22: 61 mL

## 2024-05-22 MED ORDER — SENNA 8.6 MG PO TABS
2.0000 | ORAL_TABLET | Freq: Every day | ORAL | Status: DC
  Administered 2024-05-22 – 2024-05-24 (×3): 17.2 mg
  Filled 2024-05-22 (×3): qty 2

## 2024-05-22 MED ORDER — ACETAMINOPHEN 650 MG RE SUPP: 650.0000 mg | Freq: Four times a day (QID) | RECTAL | Status: AC | PRN

## 2024-05-22 MED ORDER — EZETIMIBE 10 MG PO TABS
10.0000 mg | ORAL_TABLET | Freq: Every day | ORAL | Status: AC
  Administered 2024-05-23 – 2024-05-27 (×5): 10 mg
  Filled 2024-05-22 (×5): qty 1

## 2024-05-22 MED ORDER — DEXAMETHASONE SOD PHOSPHATE PF 10 MG/ML IJ SOLN
10.0000 mg | Freq: Once | INTRAMUSCULAR | Status: AC
  Administered 2024-05-23: 10 mg via INTRAVENOUS
  Filled 2024-05-22: qty 1

## 2024-05-22 MED ORDER — SENNA 8.6 MG PO TABS: 2.0000 | ORAL_TABLET | Freq: Every day | ORAL | Status: DC

## 2024-05-22 MED ORDER — LACTATED RINGERS IV BOLUS
1000.0000 mL | Freq: Once | INTRAVENOUS | Status: AC
  Administered 2024-05-22: 1000 mL via INTRAVENOUS

## 2024-05-22 MED ORDER — NOREPINEPHRINE 4 MG/250ML-% IV SOLN
0.0000 ug/min | INTRAVENOUS | Status: DC
  Administered 2024-05-22: 2 ug/min via INTRAVENOUS
  Filled 2024-05-22: qty 250

## 2024-05-22 MED ORDER — ASPIRIN 81 MG PO CHEW: 81.0000 mg | CHEWABLE_TABLET | Freq: Two times a day (BID) | ORAL | Status: DC

## 2024-05-22 MED ORDER — ROCURONIUM BROMIDE 100 MG/10ML IV SOLN
INTRAVENOUS | Status: DC | PRN
  Administered 2024-05-22: 50 mg via INTRAVENOUS

## 2024-05-22 MED ORDER — SODIUM CHLORIDE 0.9 % IV SOLN: 250.0000 mL | INTRAVENOUS | Status: AC

## 2024-05-22 MED ORDER — FENTANYL CITRATE (PF) 250 MCG/5ML IJ SOLN
INTRAMUSCULAR | Status: AC
  Filled 2024-05-22: qty 5

## 2024-05-22 MED ORDER — AMLODIPINE BESYLATE 5 MG PO TABS: 5.0000 mg | ORAL_TABLET | Freq: Every day | ORAL | Status: DC

## 2024-05-22 MED ORDER — BISACODYL 10 MG RE SUPP
10.0000 mg | Freq: Every day | RECTAL | Status: DC | PRN
  Administered 2024-05-25: 10 mg via RECTAL
  Filled 2024-05-22: qty 1

## 2024-05-22 MED ORDER — KETAMINE HCL 50 MG/5ML IJ SOSY
100.0000 mg | PREFILLED_SYRINGE | Freq: Once | INTRAMUSCULAR | Status: AC
  Administered 2024-05-22: 100 mg via INTRAVENOUS
  Filled 2024-05-22: qty 10

## 2024-05-22 MED ORDER — METHOCARBAMOL 1000 MG/10ML IJ SOLN: 500.0000 mg | Freq: Four times a day (QID) | INTRAMUSCULAR | Status: AC | PRN

## 2024-05-22 MED ORDER — PHENOBARBITAL SODIUM 65 MG/ML IJ SOLN
65.0000 mg | Freq: Once | INTRAMUSCULAR | Status: AC
  Administered 2024-05-22: 65 mg via INTRAVENOUS
  Filled 2024-05-22: qty 1

## 2024-05-22 MED ORDER — METHOCARBAMOL 500 MG PO TABS: 500.0000 mg | ORAL_TABLET | Freq: Four times a day (QID) | ORAL | Status: AC | PRN

## 2024-05-22 MED ORDER — DEXAMETHASONE SODIUM PHOSPHATE 4 MG/ML IJ SOLN
INTRAMUSCULAR | Status: DC | PRN
  Administered 2024-05-22: 5 mg via INTRAVENOUS

## 2024-05-22 MED ORDER — TRANEXAMIC ACID-NACL 1000-0.7 MG/100ML-% IV SOLN
1000.0000 mg | Freq: Once | INTRAVENOUS | Status: AC
  Administered 2024-05-22: 1000 mg via INTRAVENOUS
  Filled 2024-05-22: qty 100

## 2024-05-22 MED ORDER — STERILE WATER FOR IRRIGATION IR SOLN
Status: DC | PRN
  Administered 2024-05-22: 2000 mL

## 2024-05-22 MED ORDER — FENTANYL BOLUS VIA INFUSION
25.0000 ug | INTRAVENOUS | Status: AC | PRN
  Administered 2024-05-22 – 2024-05-24 (×13): 100 ug via INTRAVENOUS
  Administered 2024-05-25: 50 ug via INTRAVENOUS
  Administered 2024-05-25: 100 ug via INTRAVENOUS
  Administered 2024-05-25: 50 ug via INTRAVENOUS
  Administered 2024-05-26: 100 ug via INTRAVENOUS
  Administered 2024-05-26: 50 ug via INTRAVENOUS
  Administered 2024-05-26 – 2024-05-27 (×4): 100 ug via INTRAVENOUS
  Administered 2024-05-27: 50 ug via INTRAVENOUS
  Administered 2024-05-27: 100 ug via INTRAVENOUS
  Administered 2024-05-28: 50 ug via INTRAVENOUS

## 2024-05-22 MED ORDER — ADULT MULTIVITAMIN W/MINERALS CH
1.0000 | ORAL_TABLET | Freq: Every day | ORAL | Status: AC
  Administered 2024-05-23 – 2024-05-27 (×5): 1
  Filled 2024-05-22 (×5): qty 1

## 2024-05-22 MED ORDER — LIDOCAINE HCL (CARDIAC) PF 100 MG/5ML IV SOSY
PREFILLED_SYRINGE | INTRAVENOUS | Status: DC | PRN
  Administered 2024-05-22: 80 mg via INTRAVENOUS

## 2024-05-22 MED ORDER — ORAL CARE MOUTH RINSE
15.0000 mL | OROMUCOSAL | Status: DC
  Administered 2024-05-22 – 2024-05-23 (×8): 15 mL via OROMUCOSAL

## 2024-05-22 MED ORDER — ONDANSETRON HCL 4 MG/2ML IJ SOLN
INTRAMUSCULAR | Status: DC | PRN
  Administered 2024-05-22: 4 mg via INTRAVENOUS

## 2024-05-22 MED ORDER — TRANEXAMIC ACID 1000 MG/10ML IV SOLN
INTRAVENOUS | Status: DC | PRN
  Administered 2024-05-22: 1000 mg via INTRAVENOUS

## 2024-05-22 MED ORDER — MENTHOL 3 MG MT LOZG: 1.0000 | LOZENGE | OROMUCOSAL | Status: AC | PRN

## 2024-05-22 MED ORDER — PHENYLEPHRINE 80 MCG/ML (10ML) SYRINGE FOR IV PUSH (FOR BLOOD PRESSURE SUPPORT)
PREFILLED_SYRINGE | INTRAVENOUS | Status: AC
  Filled 2024-05-22: qty 10

## 2024-05-22 MED ORDER — CEFAZOLIN SODIUM-DEXTROSE 2-4 GM/100ML-% IV SOLN
INTRAVENOUS | Status: AC
  Filled 2024-05-22: qty 100

## 2024-05-22 MED ORDER — FENTANYL 2500MCG IN NS 250ML (10MCG/ML) PREMIX INFUSION
0.0000 ug/h | INTRAVENOUS | Status: AC
  Administered 2024-05-22: 50 ug/h via INTRAVENOUS
  Administered 2024-05-23: 100 ug/h via INTRAVENOUS
  Administered 2024-05-24: 175 ug/h via INTRAVENOUS
  Administered 2024-05-25: 50 ug/h via INTRAVENOUS
  Administered 2024-05-26: 175 ug/h via INTRAVENOUS
  Administered 2024-05-27: 75 ug/h via INTRAVENOUS
  Filled 2024-05-22 (×6): qty 250

## 2024-05-22 MED ORDER — ROSUVASTATIN CALCIUM 20 MG PO TABS
40.0000 mg | ORAL_TABLET | Freq: Every day | ORAL | Status: AC
  Administered 2024-05-23 – 2024-05-27 (×5): 40 mg
  Filled 2024-05-22 (×5): qty 2

## 2024-05-22 MED ORDER — CEFAZOLIN SODIUM-DEXTROSE 2-3 GM-%(50ML) IV SOLR
INTRAVENOUS | Status: DC | PRN
  Administered 2024-05-22: 2 g via INTRAVENOUS

## 2024-05-22 MED ORDER — NOREPINEPHRINE 4 MG/250ML-% IV SOLN
INTRAVENOUS | Status: AC
  Filled 2024-05-22: qty 250

## 2024-05-22 MED ORDER — POLYETHYLENE GLYCOL 3350 17 G PO PACK: 17.0000 g | PACK | Freq: Two times a day (BID) | ORAL | Status: DC

## 2024-05-22 MED ORDER — ROCURONIUM BROMIDE 10 MG/ML (PF) SYRINGE
100.0000 mg | PREFILLED_SYRINGE | Freq: Once | INTRAVENOUS | Status: AC
  Administered 2024-05-22: 100 mg via INTRAVENOUS
  Filled 2024-05-22: qty 10

## 2024-05-22 MED ORDER — THIAMINE HCL 100 MG/ML IJ SOLN: 100.0000 mg | Freq: Every day | INTRAMUSCULAR | Status: AC

## 2024-05-22 MED ORDER — FENTANYL CITRATE (PF) 50 MCG/ML IJ SOSY
25.0000 ug | PREFILLED_SYRINGE | Freq: Once | INTRAMUSCULAR | Status: AC
  Administered 2024-05-22: 50 ug via INTRAVENOUS
  Filled 2024-05-22: qty 1

## 2024-05-22 MED ORDER — ACETAMINOPHEN 325 MG PO TABS
650.0000 mg | ORAL_TABLET | Freq: Four times a day (QID) | ORAL | Status: AC | PRN
  Administered 2024-05-24 – 2024-05-27 (×5): 650 mg
  Filled 2024-05-22 (×5): qty 2

## 2024-05-22 MED ORDER — 0.9 % SODIUM CHLORIDE (POUR BTL) OPTIME
TOPICAL | Status: DC | PRN
  Administered 2024-05-22: 1000 mL

## 2024-05-22 MED ORDER — PHENOL 1.4 % MT LIQD: 1.0000 | OROMUCOSAL | Status: AC | PRN

## 2024-05-22 MED ORDER — THIAMINE MONONITRATE 100 MG PO TABS
100.0000 mg | ORAL_TABLET | Freq: Every day | ORAL | Status: AC
  Administered 2024-05-23 – 2024-05-27 (×5): 100 mg
  Filled 2024-05-22 (×5): qty 1

## 2024-05-22 NOTE — Procedures (Signed)
 Intubation Procedure Note  Ralph [name]  [id number]  [birth year]  Date:05/22/24  Time:1:16 PM   Provider Performing:Kaitlyn Skowron J Tajai Suder    Procedure: Intubation (31500)  Indication(s) Respiratory Failure  Consent Unable to obtain consent due to emergent nature of procedure.   Anesthesia Rocuronium  and Ketamine  (100 mg each)   Time Out Verified patient identification, verified procedure, site/side was marked, verified correct patient position, special equipment/implants available, medications/allergies/relevant history reviewed, required imaging and test results available.   Sterile Technique Usual hand hygeine, masks, and gloves were used   Procedure Description Patient positioned in bed supine.  Sedation given as noted above.  Patient was intubated with endotracheal tube using Glidescope.  View was Grade 1 full glottis .  Number of attempts was 1.  Colorimetric CO2 detector was consistent with tracheal placement.   Complications/Tolerance None; patient tolerated the procedure well.  Chest X-ray confirms appropriate position of the ETT about 2.5 cm above the level of the carina.   EBL Minimal   Specimen(s) None

## 2024-05-22 NOTE — Progress Notes (Signed)
 "  NAME:  AUSTIN HERD, MRN:  [id number], DOB:  August 04, 1950, LOS: 5 ADMISSION DATE:  05/17/2024, CONSULTATION DATE:  05/22/24 REFERRING MD:  TRH, CHIEF COMPLAINT:  hip pain   History of Present Illness:  74 year old man w/ hx of COPD, HTN, HLD, etoh abuse who presented after mechanical fall found to have R femoral neck fx tentatively planned for THA tomorrow.  Unfortunately pretty bad EtOH w/d despite ativan  so PCCM consulted to assist.  Patient is moaning and wiggling around in bed non-redirectable so hx per chart review.  Pertinent  Medical History   Past Medical History:  Diagnosis Date   Arthritis    hands   Asthma    GERD (gastroesophageal reflux disease)    History of kidney stones    x2  -episodes   Hyperlipidemia    Renal disorder    Varicose veins    bilateral surgeries- no problems now   Significant Hospital Events: Including procedures, antibiotic start and stop dates in addition to other pertinent events   1/27 admit 1/31 PCCM consult. Phenobarbital  5 mg/kg load. 2/1 Upgrade to ICU status for management of combined EtOH withdrawal, coordination of orthopedic surgery regarding operative mgmt of femoral neck fx, and airway management.  Interim History / Subjective:  Patient oscillating between sedated and agitated. Upper airway secretions difficult to address. Somewhat increased O2 requirement. Ortho amenable to taking to OR for management of hip fracture provided this is felt safe to do from CCM standpoint.  Objective    Blood pressure 107/64, pulse 63, temperature (!) 97.2 F (36.2 C), temperature source Esophageal, resp. rate 15, height 5' 11.5 (1.816 m), weight 89.7 kg, SpO2 98%.    FiO2 (%):  [50 %-100 %] 50 % Set Rate:  [15 bmp] 15 bmp Vt Set:  [610 mL] 610 mL PEEP:  [5 cmH20] 5 cmH20 Plateau Pressure:  [14 cmH20-15 cmH20] 15 cmH20   Intake/Output Summary (Last 24 hours) at 05/22/2024 2302 Last data filed at 05/22/2024 2100 Gross per 24 hour  Intake  1675.14 ml  Output 1025 ml  Net 650.14 ml   Filed Weights   05/17/24 2120 05/21/24 2352  Weight: 93.5 kg 89.7 kg    Examination: General: agitated, delirious HENT: MM dry, trachea midline Lungs: tachypneic but nonlabored (more related to agitation), some transmitted upper airway sounds c/w retained oropharyngeal secretions Cardiovascular: tachy, some ectopy on tele Abdomen: soft, +BS Extremities: dont really see any deformities even on R hip Neuro: moving everything not to command, making sounds and occasional curse words GU: male purewick in place   Resolved problem list   Assessment and Plan  Acute metabolic encephalopathy- timing, history and response to therapy are c/w delirium tremens, though cannot rule out hyperactive delirium related to pain from hip fracture. R femoral neck fracture- pain from this complicating clinical picture; concern that repair has been delayed by the patient's encephalopathic state. Hypoxemia, QUERY aspiration pneumonitis vs aspiration pneumonia - now on 4-6 L [place]. HTN/COPD/BPH/Depression- per primary  S/p 5 mg/kg phenobarbital  load. Additional phenobarbital  up to soft limit of 15 mg/kg as needed. D/c Ativan  / CIWA as we are switching to phenobarbital  monotherapy approach. OK to continue use of Precedex  as adjunctive therapy. I feel strongly we should not delay his operative repair any longer, and that his present clinical condition is not a barrier to taking him to the OR. OK for him to return to ICU in intubated state at discretion of anesthesia. We will go ahead and  intubate him in anticipation of this procedure as this will also serve to protect his airway as I do think he is having silent aspiration as evidenced by his increased oxygen requirement. Furthermore, will add CTX for coverage of aspiration sequelae.  Labs   CBC: Recent Labs  Lab 05/17/24 1927 05/18/24 0330 05/19/24 0334 05/20/24 1050 05/22/24 1022  WBC 9.5 9.9 10.5 11.8* 9.7   NEUTROABS 8.2*  --   --   --   --   HGB 14.3 15.4 14.7 13.6 13.8  HCT 41.1 45.0 44.2 41.5 40.0  MCV 101.7* 102.3* 104.5* 106.4* 102.3*  PLT 181 217 176 166 200    Basic Metabolic Panel: Recent Labs  Lab 05/17/24 1927 05/18/24 0330 05/19/24 0334 05/20/24 1050 05/22/24 1022  NA 142 139 140 138 145  K 3.9 4.8 3.9 4.4 4.1  CL 107 103 103 101 106  CO2 24 18* 25 27 28   GLUCOSE 135* 129* 188* 118* 135*  BUN 12 16 17 21  37*  CREATININE 0.89 1.18 1.17 1.07 0.93  CALCIUM  9.1 9.8 9.8 9.6 9.6  MG  --   --  2.2  --  2.8*   GFR: Estimated Creatinine Clearance: 76.5 mL/min (by C-G formula based on SCr of 0.93 mg/dL). Recent Labs  Lab 05/18/24 0330 05/19/24 0334 05/20/24 1050 05/22/24 1022  WBC 9.9 10.5 11.8* 9.7    Liver Function Tests: Recent Labs  Lab 05/19/24 0334 05/20/24 1050 05/22/24 1022  AST 26 28 44*  ALT 25 23 36  ALKPHOS 70 60 58  BILITOT 1.1 1.6* 0.9  PROT 6.9 6.9 6.7  ALBUMIN 4.3 3.9 3.9   No results for input(s): LIPASE, AMYLASE in the last 168 hours. Recent Labs  Lab 05/20/24 1050  AMMONIA 16    ABG    Component Value Date/Time   PHART 7.42 05/22/2024 1632   PCO2ART 48 05/22/2024 1632   PO2ART 249 (H) 05/22/2024 1632   HCO3 31.1 (H) 05/22/2024 1632   TCO2 22 08/11/2023 1205   ACIDBASEDEF 3.0 (H) 05/25/2023 0251   O2SAT 100 05/22/2024 1632     Coagulation Profile: No results for input(s): INR, PROTIME in the last 168 hours.  Cardiac Enzymes: No results for input(s): CKTOTAL, CKMB, CKMBINDEX, TROPONINI in the last 168 hours.  HbA1C: Hgb A1c MFr Bld  Date/Time Value Ref Range Status  05/25/2023 04:21 AM 5.2 4.8 - 5.6 % Final    Comment:    (NOTE) Pre diabetes:          5.7%-6.4%  Diabetes:              >6.4%  Glycemic control for   <7.0% adults with diabetes     CBG: Recent Labs  Lab 05/22/24 1632 05/22/24 1949  GLUCAP 132* 138*    Review of Systems:   Too encephalopathic  Past Medical History:  He,   has a past medical history of Arthritis, Asthma, GERD (gastroesophageal reflux disease), History of kidney stones, Hyperlipidemia, Renal disorder, and Varicose veins.   Surgical History:   Past Surgical History:  Procedure Laterality Date   COLONOSCOPY WITH PROPOFOL  N/A 03/20/2015   Procedure: COLONOSCOPY WITH PROPOFOL ;  Surgeon: Gladis MARLA Louder, MD;  Location: [facility];  Service: Endoscopy;  Laterality: N/A;   HERNIA REPAIR     inguinal hernia   TONSILLECTOMY     VARICOSE VEIN SURGERY Bilateral    2 years ago   VASECTOMY       Social History:   reports that  he quit smoking about 11 years ago. His smoking use included cigarettes. He started smoking about 31 years ago. He has a 10 pack-year smoking history. He has never used smokeless tobacco. He reports current alcohol use. He reports that he does not use drugs.   Family History:  His family history includes COPD in his father; Dementia in his mother.   Allergies Allergies[1]   Home Medications  Prior to Admission medications  Medication Sig Start Date End Date Taking? Authorizing Provider  albuterol  (PROVENTIL ) (2.5 MG/3ML) 0.083% nebulizer solution Take 3 mLs (2.5 mg total) by nebulization every 6 (six) hours as needed for wheezing or shortness of breath. 02/26/24 02/25/25 Yes Cobb, Comer GAILS, NP  albuterol  (VENTOLIN  HFA) 108 (90 Base) MCG/ACT inhaler Inhale 2 puffs into the lungs every 6 (six) hours as needed for wheezing or shortness of breath.   Yes [provider]  aspirin  (ASPIRIN  CHILDRENS) 81 MG chewable tablet Chew 1 tablet (81 mg total) by mouth 2 (two) times daily with a meal. 05/19/24 07/03/24 Yes Hill, Valery RAMAN, PA-C  [Paused] aspirin  EC 81 MG tablet Take 1 tablet (81 mg total) by mouth daily. Swallow whole. Wait to take this until your doctor or other care provider tells you to start again. 12/12/23  Yes Shah, Pratik D, DO  Budeson-Glycopyrrol-Formoterol  (BREZTRI  AEROSPHERE) 160-9-4.8 MCG/ACT AERO Inhale 2  puffs into the lungs daily.   Yes [provider]  Camphor-Menthol -Methyl Sal 1.2-5.7-6.3 % PTCH Apply 1 patch topically daily as needed (bilateral hip pain).   Yes [provider]  Capsicum, Cayenne, (CAYENNE PEPPER PO) Take 3 g by mouth See admin instructions. Mix 3 grams (1/2 teaspoonful) of cayenne pepper into 2 ounces of water  and drink by mouth once a day   Yes [provider]  escitalopram  (LEXAPRO ) 20 MG tablet Take 20 mg by mouth See admin instructions. Take 20 mg by mouth in the morning and after supper   Yes [provider]  guaiFENesin  (MUCINEX ) 600 MG 12 hr tablet Take 2 tablets (1,200 mg total) by mouth 2 (two) times daily. Patient taking differently: Take 600 mg by mouth 2 (two) times daily. 02/26/24  Yes Cobb, Comer GAILS, NP  HYDROcodone -acetaminophen  (NORCO/VICODIN) 5-325 MG tablet Take 1 tablet by mouth every 4 (four) hours as needed for up to 7 days for moderate pain (pain score 4-6) or severe pain (pain score 7-10). 05/19/24 05/26/24 Yes Hill, Valery RAMAN, PA-C  ibuprofen  (ADVIL ) 200 MG tablet Take 800 mg by mouth every 8 (eight) hours as needed for mild pain (pain score 1-3), headache or cramping.   Yes [provider]  omeprazole (PRILOSEC) 20 MG capsule Take 20 mg by mouth daily before breakfast.   Yes [provider]  OXYGEN Inhale 2 L/min into the lungs See admin instructions. Inhale 2 L/min into the lungs for 30 minutes THREE times a day   Yes [provider]  sodium chloride  HYPERTONIC 3 % nebulizer solution Take by nebulization in the morning and at bedtime. 3 mL each treatment Patient taking differently: Take 4 mLs by nebulization in the morning and at bedtime. 02/26/24  Yes Cobb, Comer GAILS, NP  tamsulosin  (FLOMAX ) 0.4 MG CAPS capsule Take 0.4 mg by mouth in the morning and at bedtime.   Yes [provider]  amLODipine  (NORVASC ) 5 MG tablet Take 1 tablet (5 mg total) by mouth daily. Patient not taking:  Reported on 05/17/2024 12/12/23   Maree Bracken D, DO  predniSONE  (DELTASONE ) 10 MG  tablet 4 tabs for 2 days, then 3 tabs for 2 days, 2 tabs for 2 days, then 1 tab for 2 days, then stop Patient not taking: Reported on 05/17/2024 02/26/24   Malachy Comer GAILS, NP        Total critical care time spent by me: 60 minutes   Critical care time was exclusive of separately billable procedures and the treatment of any other patient.   Critical care was necessary to treat or prevent imminent or life-threatening deterioration.  Critical care was time spent personally by me on the following activities: development of treatment plan with patient and/or surrogate as well as nursing, discussions with consultants, re-evaluation of the patient's condition and their response to treatment, examination of patient, obtaining history from patient or surrogate, ordering and performing treatments and interventions, ordering and review of laboratory studies, ordering and review of radiographic studies, and participation in multidisciplinary rounds.  Lamar Dales, MD Pulmonary, Critical Care & Sleep Medicine [facility]  7a-7p: For contact information, see AMION. If no response to pager, call PCCM Consults pager. After 7p: Call E-Link.      [1]  Allergies Allergen Reactions   Statins Other (See Comments)    LEG CRAMPS   Latex Hives and Rash   Morphine And Codeine Hives   "

## 2024-05-22 NOTE — H&P (View-Only) (Signed)
 Patient ID: Ralph [name], male   DOB: [birth year], 74 y.o.   MRN: [id number]  After further discussing the issues surrounding his care with Dr Trixie and Dr Olena we have decided that it would be in the patient's best interest to proceed with his hip replacement surgery.  It is unknown when his alcohol-induced delirium and withdrawal will clear.  They feel in an effort to help with his pain control minimizing narcotics during this episode would help him.  They feel that he stable to proceed. He has been NPO. We will proceed with right total hip replacement today to manage his right femoral neck fracture.

## 2024-05-22 NOTE — Progress Notes (Signed)
 Called by nurse to see patient.  Previous interventions to manage alcohol withdrawal symptoms consisting of significant agitation and acute delirium have been unsuccessful.  Rapid response has been notified and a bed has been arranged in the stepdown/ICU area.  To transport 4 mg of IV Ativan  has been given.  IV fluids have been ordered earlier but patient during his agitation had pulled out his IV line.  I spoke directly with Dr. Claudene and plans are to initiate Precedex .  In the interim he will be given a dose of temporizing phenobarbital .  Patient remains restrained for his safety.  He is very confused and is actively hallucinating.

## 2024-05-22 NOTE — Discharge Instructions (Signed)

## 2024-05-22 NOTE — Interval H&P Note (Signed)
 History and Physical Interval Note:  05/22/2024 1:20 PM  Ralph [name]  has presented today for surgery, with the diagnosis of right femoral neck fracture.  The various methods of treatment have been discussed with the patient and family. After consideration of risks, benefits and other options for treatment, the patient has consented to  Procedures: ARTHROPLASTY, HIP, TOTAL, ANTERIOR APPROACH (Right) as a surgical intervention.  The patient's history has been reviewed, patient examined, no change in status, stable for surgery.  I have reviewed the patient's chart and labs.  Questions were answered to the patient's satisfaction.     [name]

## 2024-05-22 NOTE — Op Note (Signed)
 MEDICAL RECORD NO.: [id number]      FACILITY:  [facility]      PHYSICIAN:  Ralph [name]  DATE OF BIRTH:  10-03-1950     DATE OF PROCEDURE:  05/22/2024                                 OPERATIVE REPORT         PREOPERATIVE DIAGNOSIS: right hip femoral neck fracture.      POSTOPERATIVE DIAGNOSIS:  right hip femoral neck fracure.      PROCEDURE:  right total hip replacement through an anterior approach   utilizing DePuy THR system, component size 58 mm pinnacle cup, a size 36+4 neutral   Altrex liner, a size 8 Hi Actis stem with a 36+1.5 delta ceramic   ball.      SURGEON:  Ralph [name], M.D.      ASSISTANT:  Rosina Calin, PA-C     ANESTHESIA:  General.      SPECIMENS:  None.      COMPLICATIONS:  None.      BLOOD LOSS:  200 cc     DRAINS:  None.      INDICATION OF THE PROCEDURE:  [name] is a 74 y.o. male who presented to the hospital on 05/17/2024 after a fall.  He was noted to have a right femoral neck fracture.  He was admitted to the hospital.  Unfortunately he related to his alcohol consumption went into withdrawal.  Surgery was held off.  Eventually a discussion with his medical team including critical care determined that we ought to address his right hip fracture to eliminate this as a source of pain to help with medical management.  At the time of the procedure and discussion with the anesthesiologist they determined that it was best for him to be intubated prior to coming down to the operating room.  Consent was obtained for   benefit of pain relief.  Specific risks of infection, DVT, component   failure, dislocation, neurovascular injury, and need for revision surgery were reviewed.     PROCEDURE IN DETAIL:  The patient was brought to operative theater.   Once adequate anesthesia, preoperative antibiotics, 2 gm of Ancef , 1 gm of Tranexamic Acid , and 10 mg of Decadron  were administered, the patient was positioned supine on the [employer]  table.  Once the patient was safely positioned with adequate padding and protections of bony prominences we predraped out the hip, and used fluoroscopy to confirm orientation of the pelvis.      The right hip was then prepped and draped from proximal iliac crest to   mid thigh with a shower curtain technique.      Time-out was performed identifying the patient, planned procedure, and the appropriate extremity.     An incision was then made 2 cm lateral to the   anterior superior iliac spine extending over the orientation of the   tensor fascia lata muscle and sharp dissection was carried down to the   fascia of the muscle.      The fascia was then incised.  The muscle belly was identified and swept   laterally and retractor placed along the superior neck.  Following   cauterization of the circumflex vessels and removing some pericapsular   fat, a second cobra retractor was placed on the inferior neck.  A T-capsulotomy was made along the line  of the   superior neck to the trochanteric fossa, then extended proximally and   distally.  Tag sutures were placed and the retractors were then placed   intracapsular.  We then identified the trochanteric fossa and   orientation of my neck cut and then made a neck osteotomy with the femur on traction.  The fractured femoral neck and femoral   head were removed without difficulty or complication.  Traction was let   off and retractors were placed posterior and anterior around the   acetabulum.      The labrum and foveal tissue were debrided.  I began reaming with a 53 mm   reamer and reamed up to 57 mm reamer with good bony bed preparation and a 58 mm  cup was chosen.  The final 58 mm Pinnacle cup was then impacted under fluoroscopy to confirm the depth of penetration and orientation with respect to   Abduction and forward flexion.  A screw was placed into the ilium followed by the hole eliminator.  The final   36+4 neutral Altrex liner was impacted  with good visualized rim fit.  The cup was positioned anatomically within the acetabular portion of the pelvis.      At this point, the femur was rolled to 100 degrees.  Further capsule was   released off the inferior aspect of the femoral neck.  I then   released the superior capsule proximally.  With the leg in a neutral position the hook was placed laterally   along the femur under the vastus lateralis origin and elevated manually and then held in position using the hook attachment on the bed.  The leg was then extended and adducted with the leg rolled to 100   degrees of external rotation.  Retractors were placed along the medial calcar and posteriorly over the greater trochanter.  Once the proximal femur was fully   exposed, I used a box osteotome to set orientation.  I then began   broaching with the starting chili pepper broach and passed this by hand and then broached up to 8.  With the 8 broach in place I chose a high offset neck and did several trial reductions.  The offset was appropriate, leg lengths   appeared to be equal best matched with the +1.5 head ball trial confirmed radiographically.   Given these findings, I went ahead and dislocated the hip, repositioned all   retractors and positioned the right hip in the extended and abducted position.  The final 8 Hi Actis stem was   chosen and it was impacted down to the level of neck cut.  Based on this   and the trial reductions, a final 36+1.5 delta ceramic ball was chosen and   impacted onto a clean and dry trunnion, and the hip was reduced.  The   hip had been irrigated throughout the case again at this point.  I did   reapproximate the superior capsular leaflet to the anterior leaflet   using #1 Vicryl.  The fascia of the   tensor fascia lata muscle was then reapproximated using #1 Vicryl and #0 Stratafix sutures.  The   remaining wound was closed with 2-0 Vicryl and running 4-0 Monocryl.   The hip was cleaned, dried, and dressed  sterilely using Dermabond and   Aquacel dressing.  The patient was then brought   to recovery room in stable condition tolerating the procedure well.    PA Patti was present for the  entirety of the case involved from   preoperative positioning, perioperative retractor management, general   facilitation of the case, as well as primary wound closure as assistant.            Ralph [name], M.D.        05/22/2024 1:21 PM

## 2024-05-22 NOTE — Progress Notes (Signed)
 Patient ID: ARBEN PACKMAN, male   DOB: 02-18-1951, 74 y.o.   MRN: [id number]  Events surrounding the issues of his current alcohol withdrawal and significant agitation and subsequent transfer to the ICU for more intensive care noted. We will hold off on surgical intervention until he is better stabilized and cleared for surgical intervention. Diet orders and management through primary team as well as PCCM. We will continue to follow until he is cleared for surgery.

## 2024-05-22 NOTE — Anesthesia Preprocedure Evaluation (Addendum)
"                                    Anesthesia Evaluation   Patient confused    Reviewed: Allergy & Precautions, Patient's Chart, lab work & pertinent test results, Unable to perform ROS - Chart review only  Airway Mallampati: Intubated       Dental  (+) Dental Advisory Given, Poor Dentition   Pulmonary asthma , COPD, former smoker   breath sounds clear to auscultation       Cardiovascular hypertension, Pt. on medications  Rhythm:Regular Rate:Normal  Echo:   1. Left ventricular ejection fraction, by estimation, is 55 to 60%. The  left ventricle has normal function. The left ventricle has no regional  wall motion abnormalities. Left ventricular diastolic parameters were  normal.   2. Right ventricular systolic function is normal. The right ventricular  size is not well visualized.   3. The mitral valve is normal in structure. No evidence of mitral valve  regurgitation. No evidence of mitral stenosis.   4. The aortic valve was not well visualized. Aortic valve regurgitation  is not visualized. No aortic stenosis is present.   5. The inferior vena cava is dilated in size with >50% respiratory  variability, suggesting right atrial pressure of 8 mmHg.      Neuro/Psych  PSYCHIATRIC DISORDERS      TIA   GI/Hepatic Neg liver ROS,GERD  Medicated,,  Endo/Other  negative endocrine ROS    Renal/GU Renal disease     Musculoskeletal  (+) Arthritis ,    Abdominal   Peds  Hematology negative hematology ROS (+)   Anesthesia Other Findings   Reproductive/Obstetrics                              Anesthesia Physical Anesthesia Plan  ASA: 3  Anesthesia Plan: General   Post-op Pain Management:    Induction: Inhalational  PONV Risk Score and Plan: Propofol  infusion, Ondansetron  and Midazolam   Airway Management Planned:   Additional Equipment: None  Intra-op Plan:   Post-operative Plan: Post-operative  intubation/ventilation  Informed Consent:      Only emergency history available and History available from chart only  Plan Discussed with: CRNA  Anesthesia Plan Comments:          Anesthesia Quick Evaluation  "

## 2024-05-22 NOTE — Anesthesia Postprocedure Evaluation (Signed)
"   Anesthesia Post Note  Patient: Ralph [name]  Procedure(s) Performed: ARTHROPLASTY, HIP, TOTAL, ANTERIOR APPROACH (Right: Hip)     Patient location during evaluation: SICU Anesthesia Type: General Level of consciousness: sedated Pain management: pain level controlled Vital Signs Assessment: post-procedure vital signs reviewed and stable Respiratory status: patient remains intubated per anesthesia plan Cardiovascular status: stable Postop Assessment: no apparent nausea or vomiting Anesthetic complications: no   No notable events documented.  Last Vitals:  Vitals:   05/22/24 1415 05/22/24 1430  BP: 126/80 139/70  Pulse: 77 68  Resp: 15 15  Temp:    SpO2: 99% 100%    Last Pain:  Vitals:   05/22/24 0715  TempSrc:   PainSc: Asleep                 Ralph [name]      "

## 2024-05-22 NOTE — Transfer of Care (Signed)
 Immediate Anesthesia Transfer of Care Note  Patient: GAINES CARTMELL  Procedure(s) Performed: ARTHROPLASTY, HIP, TOTAL, ANTERIOR APPROACH (Right: Hip)  Patient Location: ICU  Anesthesia Type:General  Level of Consciousness: awake and alert   Airway & Oxygen Therapy: Patient Spontanous Breathing and Patient connected to nasal cannula oxygen  Post-op Assessment: Report given to RN and Post -op Vital signs reviewed and stable, Report given in ICU RN  Post vital signs: stable   Last Vitals:  Vitals Value Taken Time  BP    Temp  Reviewed and stable  Pulse    Resp    SpO2      Last Pain:  Vitals:   05/22/24 0715  TempSrc:   PainSc: Asleep      Patients Stated Pain Goal: 4 (05/21/24 0800)  Complications: No notable events documented.

## 2024-05-22 NOTE — Progress Notes (Signed)
 Patient ID: Ralph [name], male   DOB: [birth year], 74 y.o.   MRN: [id number]  After further discussing the issues surrounding his care with Dr Trixie and Dr Olena we have decided that it would be in the patient's best interest to proceed with his hip replacement surgery.  It is unknown when his alcohol-induced delirium and withdrawal will clear.  They feel in an effort to help with his pain control minimizing narcotics during this episode would help him.  They feel that he stable to proceed. He has been NPO. We will proceed with right total hip replacement today to manage his right femoral neck fracture.

## 2024-05-22 NOTE — Progress Notes (Signed)
 eLink Physician-Brief Progress Note Patient Name: Ralph [name] DOB: [birth year] MRN: [id number]   Date of Service  05/22/2024  HPI/Events of Note  Notified of hypothermia with temp at 96.81F.  Pt is post arthroplasty, right hip.  He remains intubated and sedated.   eICU Interventions  Ok to apply warming blanket.      Intervention Category Intermediate Interventions: Other:  Nicosha Struve 05/22/2024, 9:19 PM

## 2024-05-22 NOTE — Progress Notes (Signed)
 ABG was delayed due to pt went to surgery.

## 2024-05-22 NOTE — Progress Notes (Signed)
 eLink Physician-Brief Progress Note Patient Name: MAHDI FRYE DOB: July 04, 1950 MRN: [id number]   Date of Service  05/22/2024  HPI/Events of Note  See my consult note  eICU Interventions  See my consult note     Intervention Category Evaluation Type: New Patient Evaluation  Toribio JAYSON Sharps 05/22/2024, 12:44 AM

## 2024-05-22 NOTE — Progress Notes (Signed)
 " PROGRESS NOTE  Ralph [name] FMW:[id number] DOB: 09/14/1950 DOA: 05/17/2024 PCP: Cleotilde Planas, MD   LOS: 5 days   Brief Narrative / Interim history: 74 year old male with history of COPD, prior tobacco use currently in remission, HTN, HLD who came to the hospital with hip pain after falling on a ice pack. He was found to have a hip fracture, orthopedic surgery was consulted and he was admitted to the hospital. Hospital course complicated by ETOH withdrawals   Subjective / 24h Interval events: Was transferred to the ICU overnight due to uncontrolled symptoms.  He was placed on Precedex  and received phenobarbital , and is completely out and poorly responsive on my evaluation in the room, with gurgling breath sounds  Assesement and Plan: Principal problem Right hip fracture-orthopedic surgery consulted, initial plans were includes appointment to get surgery 1/29, however he has been having increased confusion in the setting of ETOH withdrawal - Was initially improving yesterday but overnight had to be transferred to the ICU.  Defer timing for surgery to orthopedics team, risks/benefits of delaying hip repair   Active problems ETOH abuse, withdrawals, acute metabolic encephalopathy- patient with increased tremors and confusion ovenight 1/28-1/29.  CT scan of the brain was unremarkable, ABG unremarkable - Transferred to the ICU overnight   Essential hypertension-continue amlodipine , blood pressure stable   Hyperlipidemia-continue statin   History of COPD, hypoxic respiratory failure-started on nebulizers, chest x-ray was unremarkable.  He was wheezing more yesterday and started on steroids, will discontinue after today's dose    BPH-continue tamsulosin    Depression-continue home medications  Scheduled Meds:  amLODipine   5 mg Oral Daily   arformoterol   15 mcg Nebulization BID   budesonide  (PULMICORT ) nebulizer solution  0.25 mg Nebulization BID   Chlorhexidine  Gluconate Cloth  6 each  Topical Daily   escitalopram   20 mg Oral Daily   ezetimibe   10 mg Oral Daily   ipratropium-albuterol   3 mL Nebulization QID   multivitamin with minerals  1 tablet Oral Daily   rosuvastatin   40 mg Oral Daily   tamsulosin   0.4 mg Oral QPC supper   thiamine   100 mg Oral Daily   Or   thiamine   100 mg Intravenous Daily   Continuous Infusions:  dexmedetomidine  (PRECEDEX ) IV infusion 0.3 mcg/kg/hr (05/22/24 0739)   PRN Meds:.acetaminophen  **OR** acetaminophen , albuterol , haloperidol  lactate, HYDROmorphone  (DILAUDID ) injection, ondansetron  **OR** ondansetron  (ZOFRAN ) IV, mouth rinse, oxyCODONE , traZODone   Current Outpatient Medications  Medication Instructions   albuterol  (PROVENTIL ) 2.5 mg, Nebulization, Every 6 hours PRN   albuterol  (VENTOLIN  HFA) 108 (90 Base) MCG/ACT inhaler 2 puffs, Inhalation, Every 6 hours PRN   amLODipine  (NORVASC ) 5 mg, Oral, Daily   aspirin  (ASPIRIN  CHILDRENS) 81 mg, Oral, 2 times daily with meals   [Paused] aspirin  EC 81 mg, Oral, Daily, Swallow whole.   Budeson-Glycopyrrol-Formoterol  (BREZTRI  AEROSPHERE) 160-9-4.8 MCG/ACT AERO 2 puffs, Daily   Camphor-Menthol -Methyl Sal 1.2-5.7-6.3 % PTCH 1 patch, Apply externally, Daily PRN   Capsicum, Cayenne, (CAYENNE PEPPER PO) 3 g, Oral, See admin instructions, Mix 3 grams (1/2 teaspoonful) of cayenne pepper into 2 ounces of water  and drink by mouth once a day   escitalopram  (LEXAPRO ) 20 mg, Oral, See admin instructions, Take 20 mg by mouth in the morning and after supper   guaiFENesin  (MUCINEX ) 1,200 mg, Oral, 2 times daily   HYDROcodone -acetaminophen  (NORCO/VICODIN) 5-325 MG tablet 1 tablet, Oral, Every 4 hours PRN   ibuprofen  (ADVIL ) 800 mg, Every 8 hours PRN   omeprazole (PRILOSEC) 20 mg, Daily before  breakfast   OXYGEN 2 L/min, Inhalation, See admin instructions, Inhale 2 L/min into the lungs for 30 minutes THREE times a day   predniSONE  (DELTASONE ) 10 MG tablet 4 tabs for 2 days, then 3 tabs for 2 days, 2 tabs for 2  days, then 1 tab for 2 days, then stop   sodium chloride  HYPERTONIC 3 % nebulizer solution Nebulization, 2 times daily, 3 mL each treatment   tamsulosin  (FLOMAX ) 0.4 mg, Oral, 2 times daily    Diet Orders (From admission, onward)     Start     Ordered   05/22/24 0944  Diet NPO time specified  Diet effective now        05/22/24 0943            DVT prophylaxis: SCDs Start: 05/17/24 1950   Lab Results  Component Value Date   PLT 166 05/20/2024      Code Status: Full Code  Family Communication: No family at bedside this morning  Status is: Inpatient Remains inpatient appropriate because: Severity of illness   Level of care: Stepdown  Consultants:  Orthopedic surgery  Objective: Vitals:   05/22/24 0600 05/22/24 0700 05/22/24 0800 05/22/24 0900  BP: 136/77 129/65 (!) 145/87 (!) 135/57  Pulse: (!) 58 (!) 58 60 78  Resp: (!) 24 (!) 25 (!) 24 (!) 25  Temp:      TempSrc:      SpO2: 97% 96% 97% 96%  Weight:      Height:        Intake/Output Summary (Last 24 hours) at 05/22/2024 1006 Last data filed at 05/22/2024 0726 Gross per 24 hour  Intake 166.41 ml  Output --  Net 166.41 ml   Wt Readings from Last 3 Encounters:  05/21/24 89.7 kg  03/02/24 94.9 kg  02/29/24 93.4 kg    Examination:  Constitutional: Unresponsive Eyes: lids and conjunctivae normal, no scleral icterus ENMT: mmm Neck: normal, supple Respiratory: clear to auscultation bilaterally, no wheezing, no crackles. Normal respiratory effort.  Cardiovascular: Regular rate and rhythm, no murmurs / rubs / gallops. No LE edema. Abdomen: soft, no distention, no tenderness. Bowel sounds positive.   Data Reviewed: I have independently reviewed following labs and imaging studies   CBC Recent Labs  Lab 05/17/24 1927 05/18/24 0330 05/19/24 0334 05/20/24 1050  WBC 9.5 9.9 10.5 11.8*  HGB 14.3 15.4 14.7 13.6  HCT 41.1 45.0 44.2 41.5  PLT 181 217 176 166  MCV 101.7* 102.3* 104.5* 106.4*  MCH 35.4*  35.0* 34.8* 34.9*  MCHC 34.8 34.2 33.3 32.8  RDW 12.4 12.6 12.6 12.7  LYMPHSABS 0.7  --   --   --   MONOABS 0.6  --   --   --   EOSABS 0.0  --   --   --   BASOSABS 0.0  --   --   --     Recent Labs  Lab 05/17/24 1927 05/18/24 0330 05/19/24 0334 05/20/24 1050  NA 142 139 140 138  K 3.9 4.8 3.9 4.4  CL 107 103 103 101  CO2 24 18* 25 27  GLUCOSE 135* 129* 188* 118*  BUN 12 16 17 21   CREATININE 0.89 1.18 1.17 1.07  CALCIUM  9.1 9.8 9.8 9.6  AST  --   --  26 28  ALT  --   --  25 23  ALKPHOS  --   --  70 60  BILITOT  --   --  1.1 1.6*  ALBUMIN  --   --  4.3 3.9  MG  --   --  2.2  --   AMMONIA  --   --   --  16    ------------------------------------------------------------------------------------------------------------------ No results for input(s): CHOL, HDL, LDLCALC, TRIG, CHOLHDL, LDLDIRECT in the last 72 hours.  Lab Results  Component Value Date   HGBA1C 5.2 05/25/2023   ------------------------------------------------------------------------------------------------------------------ No results for input(s): TSH, T4TOTAL, T3FREE, THYROIDAB in the last 72 hours.  Invalid input(s): FREET3  Cardiac Enzymes No results for input(s): CKMB, TROPONINI, MYOGLOBIN in the last 168 hours.  Invalid input(s): CK ------------------------------------------------------------------------------------------------------------------    Component Value Date/Time   BNP 48.0 05/25/2023 0421   BNP 16.1 02/21/2016 1310    CBG: No results for input(s): GLUCAP in the last 168 hours.  Recent Results (from the past 240 hours)  Surgical PCR screen     Status: None   Collection Time: 05/17/24 10:45 PM   Specimen: Nasal Mucosa; Nasal Swab  Result Value Ref Range Status   MRSA, PCR NEGATIVE NEGATIVE Final   Staphylococcus aureus NEGATIVE NEGATIVE Final    Comment: (NOTE) The Xpert SA Assay (FDA approved for NASAL specimens in patients 76 years of age and  older), is one component of a comprehensive surveillance program. It is not intended to diagnose infection nor to guide or monitor treatment. Performed at [facility], [address] [place], [place] [address]   MRSA Next Gen by PCR, Nasal     Status: None   Collection Time: 05/21/24 12:13 AM   Specimen: Nasal Mucosa; Nasal Swab  Result Value Ref Range Status   MRSA by PCR Next Gen NOT DETECTED NOT DETECTED Final    Comment: (NOTE) The GeneXpert MRSA Assay (FDA approved for NASAL specimens only), is one component of a comprehensive MRSA colonization surveillance program. It is not intended to diagnose MRSA infection nor to guide or monitor treatment for MRSA infections. Test performance is not FDA approved in patients less than 81 years old. Performed at [facility], [address] [place], [place] [address]     Radiology Studies: No results found.  Nilda Fendt, MD, PhD [facility]  Between 7 am - 7 pm I am available, please contact me via Amion (for emergencies) or Securechat (non urgent messages)  Between 7 pm - 7 am I am not available, please contact night coverage MD/APP via Amion  "

## 2024-05-23 ENCOUNTER — Encounter (HOSPITAL_COMMUNITY): Payer: Self-pay | Admitting: Orthopedic Surgery

## 2024-05-23 DIAGNOSIS — S72001A Fracture of unspecified part of neck of right femur, initial encounter for closed fracture: Secondary | ICD-10-CM | POA: Diagnosis not present

## 2024-05-23 DIAGNOSIS — G9341 Metabolic encephalopathy: Secondary | ICD-10-CM | POA: Diagnosis not present

## 2024-05-23 LAB — CBC
HCT: 37.6 % — ABNORMAL LOW (ref 39.0–52.0)
Hemoglobin: 12.9 g/dL — ABNORMAL LOW (ref 13.0–17.0)
MCH: 35.4 pg — ABNORMAL HIGH (ref 26.0–34.0)
MCHC: 34.3 g/dL (ref 30.0–36.0)
MCV: 103.3 fL — ABNORMAL HIGH (ref 80.0–100.0)
Platelets: 193 10*3/uL (ref 150–400)
RBC: 3.64 MIL/uL — ABNORMAL LOW (ref 4.22–5.81)
RDW: 12.3 % (ref 11.5–15.5)
WBC: 9.3 10*3/uL (ref 4.0–10.5)
nRBC: 0 % (ref 0.0–0.2)

## 2024-05-23 LAB — BASIC METABOLIC PANEL WITH GFR
Anion gap: 8 (ref 5–15)
BUN: 34 mg/dL — ABNORMAL HIGH (ref 8–23)
CO2: 31 mmol/L (ref 22–32)
Calcium: 9.1 mg/dL (ref 8.9–10.3)
Chloride: 107 mmol/L (ref 98–111)
Creatinine, Ser: 1.11 mg/dL (ref 0.61–1.24)
GFR, Estimated: >60 mL/min
Glucose, Bld: 110 mg/dL — ABNORMAL HIGH (ref 70–99)
Potassium: 4 mmol/L (ref 3.5–5.1)
Sodium: 145 mmol/L (ref 135–145)

## 2024-05-23 LAB — GLUCOSE, CAPILLARY
Glucose-Capillary: 115 mg/dL — ABNORMAL HIGH (ref 70–99)
Glucose-Capillary: 126 mg/dL — ABNORMAL HIGH (ref 70–99)
Glucose-Capillary: 155 mg/dL — ABNORMAL HIGH (ref 70–99)
Glucose-Capillary: 158 mg/dL — ABNORMAL HIGH (ref 70–99)
Glucose-Capillary: 159 mg/dL — ABNORMAL HIGH (ref 70–99)
Glucose-Capillary: 96 mg/dL (ref 70–99)

## 2024-05-23 LAB — TRIGLYCERIDES: Triglycerides: 145 mg/dL

## 2024-05-23 LAB — MAGNESIUM: Magnesium: 2.8 mg/dL — ABNORMAL HIGH (ref 1.7–2.4)

## 2024-05-23 LAB — PHOSPHORUS: Phosphorus: 3.2 mg/dL (ref 2.5–4.6)

## 2024-05-23 MED ORDER — PROSOURCE TF20 ENFIT COMPATIBL EN LIQD
60.0000 mL | Freq: Every day | ENTERAL | Status: DC
  Administered 2024-05-23: 60 mL
  Filled 2024-05-23: qty 60

## 2024-05-23 MED ORDER — SODIUM CHLORIDE 0.9 % IV SOLN
250.0000 mL | INTRAVENOUS | Status: AC
  Administered 2024-05-23: 250 mL via INTRAVENOUS

## 2024-05-23 MED ORDER — LACTATED RINGERS IV BOLUS
500.0000 mL | Freq: Once | INTRAVENOUS | Status: AC
  Administered 2024-05-23: 500 mL via INTRAVENOUS

## 2024-05-23 MED ORDER — ORAL CARE MOUTH RINSE: 15.0000 mL | OROMUCOSAL | Status: DC | PRN

## 2024-05-23 MED ORDER — PROSOURCE TF20 ENFIT COMPATIBL EN LIQD
60.0000 mL | Freq: Two times a day (BID) | ENTERAL | Status: AC
  Administered 2024-05-23 – 2024-05-27 (×9): 60 mL
  Filled 2024-05-23 (×9): qty 60

## 2024-05-23 MED ORDER — FREE WATER
200.0000 mL | Status: AC
  Administered 2024-05-23 – 2024-05-27 (×26): 200 mL

## 2024-05-23 MED ORDER — AMLODIPINE BESYLATE 5 MG PO TABS
5.0000 mg | ORAL_TABLET | Freq: Every day | ORAL | Status: DC
  Administered 2024-05-23 – 2024-05-24 (×2): 5 mg
  Filled 2024-05-23 (×2): qty 1

## 2024-05-23 MED ORDER — OSMOLITE 1.5 CAL PO LIQD
1000.0000 mL | ORAL | Status: AC
  Administered 2024-05-23 – 2024-05-27 (×6): 1000 mL
  Filled 2024-05-23 (×8): qty 1000

## 2024-05-23 MED ORDER — ORAL CARE MOUTH RINSE: 15.0000 mL | OROMUCOSAL | Status: DC

## 2024-05-23 MED ORDER — ORAL CARE MOUTH RINSE
15.0000 mL | OROMUCOSAL | Status: DC
  Administered 2024-05-23 – 2024-05-26 (×43): 15 mL via OROMUCOSAL

## 2024-05-23 MED ORDER — DEXMEDETOMIDINE HCL IN NACL 400 MCG/100ML IV SOLN
0.0000 ug/kg/h | INTRAVENOUS | Status: DC
  Administered 2024-05-23: 1.2 ug/kg/h via INTRAVENOUS
  Administered 2024-05-24: 1.4 ug/kg/h via INTRAVENOUS
  Administered 2024-05-24 (×3): 1.2 ug/kg/h via INTRAVENOUS
  Administered 2024-05-24: 1.4 ug/kg/h via INTRAVENOUS
  Administered 2024-05-24: 1.2 ug/kg/h via INTRAVENOUS
  Administered 2024-05-24: 1 ug/kg/h via INTRAVENOUS
  Administered 2024-05-25: 0.5 ug/kg/h via INTRAVENOUS
  Administered 2024-05-25: 0.9 ug/kg/h via INTRAVENOUS
  Administered 2024-05-25: 0.5 ug/kg/h via INTRAVENOUS
  Administered 2024-05-26: 0.7 ug/kg/h via INTRAVENOUS
  Administered 2024-05-26 (×2): 0.9 ug/kg/h via INTRAVENOUS
  Administered 2024-05-26: 0.6 ug/kg/h via INTRAVENOUS
  Administered 2024-05-27: 0.8 ug/kg/h via INTRAVENOUS
  Administered 2024-05-27: 0.7 ug/kg/h via INTRAVENOUS
  Filled 2024-05-23 (×17): qty 100

## 2024-05-23 MED ORDER — VITAL HP 1.0 CAL PO LIQD: 1000.0000 mL | ORAL | Status: DC

## 2024-05-23 NOTE — Plan of Care (Signed)

## 2024-05-23 NOTE — Progress Notes (Signed)
 PT Cancellation Note  Patient Details Name: Ralph [name] MRN: [id number] DOB: 03/07/1951   Cancelled Treatment:    Reason Eval/Treat Not Completed: Medical issues which prohibited therapy Patient is in ventilator.  PT will initiate once extubated.  [name] PT Acute Rehabilitation Services Office [phone]   [name] Norris 05/23/2024, 7:33 AM

## 2024-05-23 NOTE — Progress Notes (Signed)
 Patient ID: Ralph [name], male   DOB: 08/11/1950, 75 y.o.   MRN: [id number] Subjective: 1 Day Post-Op Procedures (LRB): ARTHROPLASTY, HIP, TOTAL, ANTERIOR APPROACH (Right)    Patient remains intubated following his right THR due to pre-operative ETOH withdrawl related concerns.  Objective:   VITALS:   Vitals:   05/23/24 0600 05/23/24 0615  BP: 115/72 107/72  Pulse: 89 87  Resp: 15 15  Temp: 99.5 F (37.5 C) 99.7 F (37.6 C)  SpO2: 98% 98%    Incision: dressing C/D/I  LABS Recent Labs    05/20/24 1050 05/22/24 1022 05/23/24 0543  HGB 13.6 13.8 12.9*  HCT 41.5 40.0 37.6*  WBC 11.8* 9.7 9.3  PLT 166 200 193    Recent Labs    05/20/24 1050 05/22/24 1022  NA 138 145  K 4.4 4.1  BUN 21 37*  CREATININE 1.07 0.93  GLUCOSE 118* 135*    No results for input(s): LABPT, INR in the last 72 hours.   Assessment/Plan: 1 Day Post-Op Procedures (LRB): ARTHROPLASTY, HIP, TOTAL, ANTERIOR APPROACH (Right)   Plan: Medical management at this time Once stabilized and able to he will begin working with PT (WBAT) to regain function DVT per ICU at this time but once more functional will transition to ASA 81 mg BID

## 2024-05-23 NOTE — TOC Progression Note (Signed)
 Transition of Care [facility]) - Progression Note    Patient Details  Name: Ralph [name] MRN: [id number] Date of Birth: March 07, 1951  [facility] CM/SW Contact  Jon ONEIDA Anon, RN Phone Number: 05/23/2024, 4:10 PM  Clinical Narrative:     Pt is currently on the ventilator. Not medically stable for discharge. PT/OT consulted, awaiting any recommendations. ICM will continue to follow for DC planning needs.  Expected Discharge Plan:  (TBD) Barriers to Discharge: Continued Medical Work up               Expected Discharge Plan and Services In-house Referral: Clinical Social Work     Living arrangements for the past 2 months: Single Family Home                                       Social Drivers of Health (SDOH) Interventions SDOH Screenings   Food Insecurity: No Food Insecurity (05/17/2024)  Housing: Low Risk (05/17/2024)  Transportation Needs: No Transportation Needs (05/17/2024)  Utilities: Not At Risk (05/17/2024)  Social Connections: Moderately Integrated (05/17/2024)  Tobacco Use: Medium Risk (05/17/2024)    Readmission Risk Interventions    05/20/2024    4:20 PM 06/09/2023    9:53 AM  Readmission Risk Prevention Plan  Post Dischage Appt Complete   Medication Screening Complete   Transportation Screening Complete Complete  PCP or Specialist Appt within 5-7 Days  Complete  Home Care Screening  Complete  Medication Review (RN CM)  Referral to Pharmacy

## 2024-05-24 ENCOUNTER — Inpatient Hospital Stay (HOSPITAL_COMMUNITY)

## 2024-05-24 ENCOUNTER — Encounter (HOSPITAL_COMMUNITY): Payer: Self-pay | Admitting: Orthopedic Surgery

## 2024-05-24 DIAGNOSIS — F32A Depression, unspecified: Secondary | ICD-10-CM

## 2024-05-24 DIAGNOSIS — F10931 Alcohol use, unspecified with withdrawal delirium: Secondary | ICD-10-CM | POA: Diagnosis not present

## 2024-05-24 DIAGNOSIS — J449 Chronic obstructive pulmonary disease, unspecified: Secondary | ICD-10-CM

## 2024-05-24 DIAGNOSIS — J9601 Acute respiratory failure with hypoxia: Secondary | ICD-10-CM | POA: Diagnosis not present

## 2024-05-24 DIAGNOSIS — S72001A Fracture of unspecified part of neck of right femur, initial encounter for closed fracture: Secondary | ICD-10-CM | POA: Diagnosis not present

## 2024-05-24 DIAGNOSIS — J690 Pneumonitis due to inhalation of food and vomit: Secondary | ICD-10-CM | POA: Diagnosis not present

## 2024-05-24 DIAGNOSIS — I10 Essential (primary) hypertension: Secondary | ICD-10-CM | POA: Diagnosis not present

## 2024-05-24 DIAGNOSIS — G9341 Metabolic encephalopathy: Secondary | ICD-10-CM | POA: Diagnosis not present

## 2024-05-24 DIAGNOSIS — N400 Enlarged prostate without lower urinary tract symptoms: Secondary | ICD-10-CM

## 2024-05-24 DIAGNOSIS — R579 Shock, unspecified: Secondary | ICD-10-CM

## 2024-05-24 LAB — POCT I-STAT 7, (LYTES, BLD GAS, ICA,H+H)
Acid-base deficit: 1 mmol/L (ref 0.0–2.0)
Bicarbonate: 20.5 mmol/L (ref 20.0–28.0)
Calcium, Ion: 1.14 mmol/L — ABNORMAL LOW (ref 1.15–1.40)
HCT: 36 % — ABNORMAL LOW (ref 39.0–52.0)
Hemoglobin: 12.2 g/dL — ABNORMAL LOW (ref 13.0–17.0)
O2 Saturation: 100 %
Potassium: 3.8 mmol/L (ref 3.5–5.1)
Sodium: 137 mmol/L (ref 135–145)
TCO2: 21 mmol/L — ABNORMAL LOW (ref 22–32)
pCO2 arterial: 23.7 mmHg — ABNORMAL LOW (ref 32–48)
pH, Arterial: 7.545 — ABNORMAL HIGH (ref 7.35–7.45)
pO2, Arterial: 148 mmHg — ABNORMAL HIGH (ref 83–108)

## 2024-05-24 LAB — CBC
HCT: 41 % (ref 39.0–52.0)
Hemoglobin: 13.4 g/dL (ref 13.0–17.0)
MCH: 34.7 pg — ABNORMAL HIGH (ref 26.0–34.0)
MCHC: 32.7 g/dL (ref 30.0–36.0)
MCV: 106.2 fL — ABNORMAL HIGH (ref 80.0–100.0)
Platelets: 172 10*3/uL (ref 150–400)
RBC: 3.86 MIL/uL — ABNORMAL LOW (ref 4.22–5.81)
RDW: 12.1 % (ref 11.5–15.5)
WBC: 10.6 10*3/uL — ABNORMAL HIGH (ref 4.0–10.5)
nRBC: 0 % (ref 0.0–0.2)

## 2024-05-24 LAB — BASIC METABOLIC PANEL WITH GFR
Anion gap: 10 (ref 5–15)
BUN: 28 mg/dL — ABNORMAL HIGH (ref 8–23)
CO2: 27 mmol/L (ref 22–32)
Calcium: 9.1 mg/dL (ref 8.9–10.3)
Chloride: 106 mmol/L (ref 98–111)
Creatinine, Ser: 0.89 mg/dL (ref 0.61–1.24)
GFR, Estimated: >60 mL/min
Glucose, Bld: 156 mg/dL — ABNORMAL HIGH (ref 70–99)
Potassium: 4.1 mmol/L (ref 3.5–5.1)
Sodium: 143 mmol/L (ref 135–145)

## 2024-05-24 LAB — GLUCOSE, CAPILLARY
Glucose-Capillary: 148 mg/dL — ABNORMAL HIGH (ref 70–99)
Glucose-Capillary: 149 mg/dL — ABNORMAL HIGH (ref 70–99)
Glucose-Capillary: 150 mg/dL — ABNORMAL HIGH (ref 70–99)
Glucose-Capillary: 126 mg/dL — ABNORMAL HIGH (ref 70–99)
Glucose-Capillary: 206 mg/dL — ABNORMAL HIGH (ref 70–99)
Glucose-Capillary: 161 mg/dL — ABNORMAL HIGH (ref 70–99)

## 2024-05-24 LAB — PHOSPHORUS: Phosphorus: 2.5 mg/dL (ref 2.5–4.6)

## 2024-05-24 LAB — BLOOD GAS, VENOUS
Acid-Base Excess: 1 mmol/L (ref 0.0–2.0)
Bicarbonate: 26.6 mmol/L (ref 20.0–28.0)
O2 Saturation: 71.8 %
Patient temperature: 37
pCO2, Ven: 45 mmHg (ref 44–60)
pH, Ven: 7.38 (ref 7.25–7.43)
pO2, Ven: 44 mmHg (ref 32–45)

## 2024-05-24 LAB — MAGNESIUM: Magnesium: 2.5 mg/dL — ABNORMAL HIGH (ref 1.7–2.4)

## 2024-05-24 LAB — LIPASE, BLOOD: Lipase: 45 U/L (ref 11–51)

## 2024-05-24 MED ORDER — SODIUM CHLORIDE 0.9 % IV SOLN
455.0000 mg | Freq: Once | INTRAVENOUS | Status: AC
  Administered 2024-05-24: 455 mg via INTRAVENOUS
  Filled 2024-05-24: qty 3.5

## 2024-05-24 MED ORDER — PROPOFOL BOLUS VIA INFUSION
20.0000 mg | Freq: Once | INTRAVENOUS | Status: AC
  Administered 2024-05-24: 20 mg via INTRAVENOUS

## 2024-05-24 MED ORDER — SODIUM CHLORIDE 0.9 % IV SOLN
260.0000 mg | Freq: Once | INTRAVENOUS | Status: AC
  Administered 2024-05-24: 260 mg via INTRAVENOUS
  Filled 2024-05-24: qty 2

## 2024-05-24 MED ORDER — LACTATED RINGERS IV BOLUS
500.0000 mL | Freq: Once | INTRAVENOUS | Status: AC
  Administered 2024-05-24: 500 mL via INTRAVENOUS

## 2024-05-24 MED ORDER — HALOPERIDOL LACTATE 5 MG/ML IJ SOLN
5.0000 mg | Freq: Once | INTRAMUSCULAR | Status: AC
  Administered 2024-05-24: 5 mg via INTRAVENOUS

## 2024-05-24 MED ORDER — NOREPINEPHRINE 4 MG/250ML-% IV SOLN
0.0000 ug/min | INTRAVENOUS | Status: AC
  Administered 2024-05-24: 2 ug/min via INTRAVENOUS
  Administered 2024-05-26: 1 ug/min via INTRAVENOUS
  Filled 2024-05-24: qty 250

## 2024-05-24 MED ORDER — ENOXAPARIN SODIUM 40 MG/0.4ML IJ SOSY
40.0000 mg | PREFILLED_SYRINGE | INTRAMUSCULAR | Status: AC
  Administered 2024-05-24 – 2024-05-27 (×4): 40 mg via SUBCUTANEOUS
  Filled 2024-05-24 (×4): qty 0.4

## 2024-05-24 MED ORDER — IOHEXOL 350 MG/ML SOLN
75.0000 mL | Freq: Once | INTRAVENOUS | Status: AC | PRN
  Administered 2024-05-24: 75 mL via INTRAVENOUS

## 2024-05-24 MED ORDER — PHENOBARBITAL SODIUM 130 MG/ML IJ SOLN
130.0000 mg | Freq: Once | INTRAMUSCULAR | Status: AC
  Administered 2024-05-24: 130 mg via INTRAVENOUS
  Filled 2024-05-24: qty 1

## 2024-05-24 MED ORDER — HALOPERIDOL LACTATE 5 MG/ML IJ SOLN: 5.0000 mg | Freq: Once | INTRAMUSCULAR | Status: DC

## 2024-05-24 MED ORDER — SODIUM CHLORIDE 0.9 % IV SOLN
250.0000 mL | INTRAVENOUS | Status: AC
  Administered 2024-05-24: 250 mL via INTRAVENOUS

## 2024-05-24 NOTE — Progress Notes (Signed)
 RT NOTE: Pt has been on multiple vent settings today with pressure support. Pt was placed on 12PS and 5peep with hopes of getting him wean to 8 or 6 of PS to extubated. Once pt sedation was turned off pt started breathing in th 50's, sats in the low 80's and BP low. Pt was placed back on full support at FIO2 100%. Pt sats remained at 86-87% on full support. Pt then was placed back on 12PS and 5 peep, 100%. Pt appears to have better vitals on PS. Pt currently on 12PS, 5 peep, FIO2 100%.

## 2024-05-24 NOTE — Procedures (Signed)
 Central Venous Catheter Insertion Procedure Note  Ralph [name]  [id number]  04-05-1951  Date:05/24/24  Time:6:13 PM   Provider Performing:Jaylyn Booher A [name]   Procedure: Insertion of Non-tunneled Central Venous Catheter(36556) with US  guidance (23062)   Indication(s) Medication administration and Difficult access  Consent Risks of the procedure as well as the alternatives and risks of each were explained to the patient and/or caregiver.  Consent for the procedure was obtained and is signed in the bedside chart  Anesthesia Topical only with 1% lidocaine    Timeout Verified patient identification, verified procedure, site/side was marked, verified correct patient position, special equipment/implants available, medications/allergies/relevant history reviewed, required imaging and test results available.  Sterile Technique Maximal sterile technique including full sterile barrier drape, hand hygiene, sterile gown, sterile gloves, mask, hair covering, sterile ultrasound probe cover (if used).  Procedure Description Area of catheter insertion was cleaned with chlorhexidine  and draped in sterile fashion.  With real-time ultrasound guidance a central venous catheter was placed into the left internal jugular vein. Nonpulsatile blood flow and easy flushing noted in all ports.  The catheter was sutured in place and sterile dressing applied.  Complications/Tolerance None; patient tolerated the procedure well. Chest X-ray is ordered to verify placement for internal jugular or subclavian cannulation.   Chest x-ray is not ordered for femoral cannulation.  EBL Minimal  Specimen(s) None   Alveria DELENA [name], NP [facility] 05/24/24 6:14 PM  Please see [email or web address] for pager details  From 7A-7P. If no response, please call [phone] After hours, please call Elink: [phone]

## 2024-05-24 NOTE — Addendum Note (Signed)
 Addendum  created 05/24/24 1209 by Tilford Franky BIRCH, MD   Intraprocedure Event edited

## 2024-05-25 ENCOUNTER — Inpatient Hospital Stay (HOSPITAL_COMMUNITY)

## 2024-05-25 DIAGNOSIS — N400 Enlarged prostate without lower urinary tract symptoms: Secondary | ICD-10-CM | POA: Diagnosis not present

## 2024-05-25 DIAGNOSIS — S72001A Fracture of unspecified part of neck of right femur, initial encounter for closed fracture: Secondary | ICD-10-CM | POA: Diagnosis not present

## 2024-05-25 DIAGNOSIS — F10931 Alcohol use, unspecified with withdrawal delirium: Secondary | ICD-10-CM | POA: Diagnosis not present

## 2024-05-25 DIAGNOSIS — G9341 Metabolic encephalopathy: Secondary | ICD-10-CM | POA: Diagnosis not present

## 2024-05-25 DIAGNOSIS — J690 Pneumonitis due to inhalation of food and vomit: Secondary | ICD-10-CM | POA: Diagnosis not present

## 2024-05-25 DIAGNOSIS — J9601 Acute respiratory failure with hypoxia: Secondary | ICD-10-CM | POA: Diagnosis not present

## 2024-05-25 DIAGNOSIS — I10 Essential (primary) hypertension: Secondary | ICD-10-CM | POA: Diagnosis not present

## 2024-05-25 DIAGNOSIS — J449 Chronic obstructive pulmonary disease, unspecified: Secondary | ICD-10-CM | POA: Diagnosis not present

## 2024-05-25 LAB — GLUCOSE, CAPILLARY
Glucose-Capillary: 115 mg/dL — ABNORMAL HIGH (ref 70–99)
Glucose-Capillary: 136 mg/dL — ABNORMAL HIGH (ref 70–99)
Glucose-Capillary: 152 mg/dL — ABNORMAL HIGH (ref 70–99)
Glucose-Capillary: 163 mg/dL — ABNORMAL HIGH (ref 70–99)
Glucose-Capillary: 183 mg/dL — ABNORMAL HIGH (ref 70–99)
Glucose-Capillary: 203 mg/dL — ABNORMAL HIGH (ref 70–99)

## 2024-05-25 LAB — CBC
HCT: 37.9 % — ABNORMAL LOW (ref 39.0–52.0)
Hemoglobin: 12.1 g/dL — ABNORMAL LOW (ref 13.0–17.0)
MCH: 34.3 pg — ABNORMAL HIGH (ref 26.0–34.0)
MCHC: 31.9 g/dL (ref 30.0–36.0)
MCV: 107.4 fL — ABNORMAL HIGH (ref 80.0–100.0)
Platelets: 147 10*3/uL — ABNORMAL LOW (ref 150–400)
RBC: 3.53 MIL/uL — ABNORMAL LOW (ref 4.22–5.81)
RDW: 12.2 % (ref 11.5–15.5)
WBC: 10 10*3/uL (ref 4.0–10.5)
nRBC: 0 % (ref 0.0–0.2)

## 2024-05-25 LAB — PHOSPHORUS: Phosphorus: 2.7 mg/dL (ref 2.5–4.6)

## 2024-05-25 LAB — BASIC METABOLIC PANEL WITH GFR
Anion gap: 7 (ref 5–15)
BUN: 26 mg/dL — ABNORMAL HIGH (ref 8–23)
CO2: 26 mmol/L (ref 22–32)
Calcium: 8.8 mg/dL — ABNORMAL LOW (ref 8.9–10.3)
Chloride: 107 mmol/L (ref 98–111)
Creatinine, Ser: 0.71 mg/dL (ref 0.61–1.24)
GFR, Estimated: >60 mL/min
Glucose, Bld: 165 mg/dL — ABNORMAL HIGH (ref 70–99)
Potassium: 3.9 mmol/L (ref 3.5–5.1)
Sodium: 140 mmol/L (ref 135–145)

## 2024-05-25 LAB — MAGNESIUM: Magnesium: 2.2 mg/dL (ref 1.7–2.4)

## 2024-05-25 LAB — LACTIC ACID, PLASMA: Lactic Acid, Venous: 1.8 mmol/L (ref 0.5–1.9)

## 2024-05-25 MED ORDER — KETAMINE HCL 50 MG/5ML IJ SOSY
PREFILLED_SYRINGE | INTRAMUSCULAR | Status: AC
  Administered 2024-05-25: 50 mg via INTRAVENOUS
  Filled 2024-05-25: qty 5

## 2024-05-25 MED ORDER — INSULIN ASPART 100 UNIT/ML IJ SOLN
0.0000 [IU] | INTRAMUSCULAR | Status: AC
  Administered 2024-05-25: 3 [IU] via SUBCUTANEOUS
  Administered 2024-05-25 – 2024-05-26 (×4): 2 [IU] via SUBCUTANEOUS
  Administered 2024-05-26: 3 [IU] via SUBCUTANEOUS
  Administered 2024-05-27: 2 [IU] via SUBCUTANEOUS
  Administered 2024-05-27 (×3): 3 [IU] via SUBCUTANEOUS
  Administered 2024-05-27: 2 [IU] via SUBCUTANEOUS
  Administered 2024-05-27: 5 [IU] via SUBCUTANEOUS
  Filled 2024-05-25: qty 2
  Filled 2024-05-25: qty 3
  Filled 2024-05-25: qty 5
  Filled 2024-05-25: qty 2
  Filled 2024-05-25 (×3): qty 3
  Filled 2024-05-25 (×3): qty 2
  Filled 2024-05-25: qty 3
  Filled 2024-05-25: qty 2

## 2024-05-25 MED ORDER — BISACODYL 10 MG RE SUPP
10.0000 mg | Freq: Every day | RECTAL | Status: DC
  Administered 2024-05-26: 10 mg via RECTAL
  Filled 2024-05-25: qty 1

## 2024-05-25 MED ORDER — SMOG ENEMA
400.0000 mL | Freq: Once | RECTAL | Status: AC
  Administered 2024-05-26: 400 mL via RECTAL
  Filled 2024-05-25: qty 960

## 2024-05-25 MED ORDER — SENNOSIDES-DOCUSATE SODIUM 8.6-50 MG PO TABS
1.0000 | ORAL_TABLET | Freq: Two times a day (BID) | ORAL | Status: AC
  Administered 2024-05-25 – 2024-05-27 (×5): 1
  Filled 2024-05-25 (×5): qty 1

## 2024-05-25 MED ORDER — KETAMINE HCL 50 MG/5ML IJ SOSY: 50.0000 mg | PREFILLED_SYRINGE | Freq: Once | INTRAMUSCULAR | Status: AC

## 2024-05-25 NOTE — Progress Notes (Signed)
 "  NAME:  MAXEY RANSOM, MRN:  [id number], DOB:  [birth year], LOS: 8 ADMISSION DATE:  05/17/2024, CONSULTATION DATE:  05/22/24 REFERRING MD:  TRH, CHIEF COMPLAINT:  hip pain   History of Present Illness:  74 year old man w/ hx of COPD, HTN, HLD, etoh abuse who presented after mechanical fall found to have R femoral neck fx tentatively planned for THA tomorrow.  Unfortunately pretty bad EtOH w/d despite ativan  so PCCM consulted to assist.  Patient is moaning and wiggling around in bed non-redirectable so hx per chart review.  Pertinent  Medical History   Past Medical History:  Diagnosis Date   Arthritis    hands   Asthma    GERD (gastroesophageal reflux disease)    History of kidney stones    x2  -episodes   Hyperlipidemia    Renal disorder    Varicose veins    bilateral surgeries- no problems now   Significant Hospital Events: Including procedures, antibiotic start and stop dates in addition to other pertinent events   1/27 admit 1/31 PCCM consult. Phenobarbital  5 mg/kg load. 2/1 Upgrade to ICU status for management of combined EtOH withdrawal, coordination of orthopedic surgery regarding operative mgmt of femoral neck fx, and airway management. Underwent operative repair.  Interim History / Subjective:  Agitation ongoing. Continues to prohibit safe liberation from ventilator. CTA Chest overnight revealed dense L > R lower lobe consolidation c/w aspiration PNA. NCHCT overnight - no acute process.  Objective    Blood pressure 112/64, pulse 77, temperature (!) 100.8 F (38.2 C), temperature source Axillary, resp. rate 18, height 5' 11.5 (1.816 m), weight 88.9 kg, SpO2 98%. CVP:  [6 mmHg-19 mmHg] 6 mmHg  Vent Mode: PSV FiO2 (%):  [40 %-100 %] 100 % PEEP:  [5 cmH20-8 cmH20] 8 cmH20 Pressure Support:  [12 cmH20] 12 cmH20   Intake/Output Summary (Last 24 hours) at 05/25/2024 2100 Last data filed at 05/25/2024 1930 Gross per 24 hour  Intake 3112.65 ml  Output 1125 ml  Net 1987.65  ml   Filed Weights   05/17/24 2120 05/21/24 2352 05/25/24 0400  Weight: 93.5 kg 89.7 kg 88.9 kg    Examination: General: agitated, on ventilator HENT: MM dry, trachea midline Lungs: Coarse mechanical breath sounds. Cardiovascular: tachy, some ectopy on tele Abdomen: soft, +BS   Resolved problem list   Assessment and Plan  Acute metabolic encephalopathy Severe alcohol withdrawal syndrome / delirium tremens R femoral neck fracture s/p R THR Aspiration pneumonia HTN/COPD/BPH/Depression  S/p 10 mg/kg IBW phenobarbital  load, 5 mg/kg rescue dose. Remains on propofol , Precedex , fentanyl  At one point this afternoon, we were able to get him down to PSV 5/5, 40% while maintaining SpO2 88-89%. He then became acutely agitated with production of copious tan secretions via in-line suctioning and needed re-sedation. Continue ceftriaxone  for aspiration pneumonia Re-ordered tracheal aspirate culture as his fevers are ongoing despite several days of ABX, though this may simply indicate a central etiology rather than an untreated infection, especially given that he remains normotensive (even hypertensive at times). QUERY undiagnosed COPD S/p R THR: Need to get him extubated so he can engage with PT as soon as possible Tube feeds currently on hold  It was necessary for me to spend extensive time at the patient's bedside during my shift responding to his severely elevated RR, intermittent bouts of hypotension requiring levophed , and hypoxemia.  Labs   CBC: Recent Labs  Lab 05/20/24 1050 05/22/24 1022 05/23/24 0543 05/24/24 0253 05/24/24 1444 05/25/24  0400  WBC 11.8* 9.7 9.3 10.6*  --  10.0  HGB 13.6 13.8 12.9* 13.4 12.2* 12.1*  HCT 41.5 40.0 37.6* 41.0 36.0* 37.9*  MCV 106.4* 102.3* 103.3* 106.2*  --  107.4*  PLT 166 200 193 172  --  147*    Basic Metabolic Panel: Recent Labs  Lab 05/19/24 0334 05/20/24 1050 05/22/24 1022 05/23/24 0543 05/24/24 0253 05/24/24 1444 05/25/24 0400   NA 140 138 145 145 143 137 140  K 3.9 4.4 4.1 4.0 4.1 3.8 3.9  CL 103 101 106 107 106  --  107  CO2 25 27 28 31 27   --  26  GLUCOSE 188* 118* 135* 110* 156*  --  165*  BUN 17 21 37* 34* 28*  --  26*  CREATININE 1.17 1.07 0.93 1.11 0.89  --  0.71  CALCIUM  9.8 9.6 9.6 9.1 9.1  --  8.8*  MG 2.2  --  2.8* 2.8* 2.5*  --  2.2  PHOS  --   --   --  3.2 2.5  --  2.7   GFR: Estimated Creatinine Clearance: 89 mL/min (by C-G formula based on SCr of 0.71 mg/dL). Recent Labs  Lab 05/22/24 1022 05/23/24 0543 05/24/24 0253 05/25/24 0400  WBC 9.7 9.3 10.6* 10.0  LATICACIDVEN  --   --   --  1.8    Liver Function Tests: Recent Labs  Lab 05/19/24 0334 05/20/24 1050 05/22/24 1022  AST 26 28 44*  ALT 25 23 36  ALKPHOS 70 60 58  BILITOT 1.1 1.6* 0.9  PROT 6.9 6.9 6.7  ALBUMIN 4.3 3.9 3.9   Recent Labs  Lab 05/24/24 1455  LIPASE 45   Recent Labs  Lab 05/20/24 1050  AMMONIA 16    ABG    Component Value Date/Time   PHART 7.545 (H) 05/24/2024 1444   PCO2ART 23.7 (L) 05/24/2024 1444   PO2ART 148 (H) 05/24/2024 1444   HCO3 26.6 05/24/2024 1455   TCO2 21 (L) 05/24/2024 1444   ACIDBASEDEF 1.0 05/24/2024 1444   O2SAT 71.8 05/24/2024 1455     Coagulation Profile: No results for input(s): INR, PROTIME in the last 168 hours.  Cardiac Enzymes: No results for input(s): CKTOTAL, CKMB, CKMBINDEX, TROPONINI in the last 168 hours.  HbA1C: Hgb A1c MFr Bld  Date/Time Value Ref Range Status  05/25/2023 04:21 AM 5.2 4.8 - 5.6 % Final    Comment:    (NOTE) Pre diabetes:          5.7%-6.4%  Diabetes:              >6.4%  Glycemic control for   <7.0% adults with diabetes     CBG: Recent Labs  Lab 05/25/24 0327 05/25/24 0742 05/25/24 1137 05/25/24 1733 05/25/24 1935  GLUCAP 183* 203* 152* 163* 136*    Past Medical History:  He,  has a past medical history of Arthritis, Asthma, GERD (gastroesophageal reflux disease), History of kidney stones, Hyperlipidemia,  Renal disorder, and Varicose veins.   Surgical History:   Past Surgical History:  Procedure Laterality Date   COLONOSCOPY WITH PROPOFOL  N/A 03/20/2015   Procedure: COLONOSCOPY WITH PROPOFOL ;  Surgeon: Gladis MARLA Louder, MD;  Location: [facility];  Service: Endoscopy;  Laterality: N/A;   HERNIA REPAIR     inguinal hernia   TONSILLECTOMY     TOTAL HIP ARTHROPLASTY Right 05/22/2024   Procedure: ARTHROPLASTY, HIP, TOTAL, ANTERIOR APPROACH;  Surgeon: Ernie Cough, MD;  Location: [facility];  Service: Orthopedics;  Laterality: Right;   VARICOSE VEIN SURGERY Bilateral    2 years ago   VASECTOMY       Social History:   reports that he quit smoking about 11 years ago. His smoking use included cigarettes. He started smoking about 31 years ago. He has a 10 pack-year smoking history. He has never used smokeless tobacco. He reports current alcohol use. He reports that he does not use drugs.   Family History:  His family history includes COPD in his father; Dementia in his mother.   Allergies Allergies[1]   Home Medications  Prior to Admission medications  Medication Sig Start Date End Date Taking? Authorizing Provider  albuterol  (PROVENTIL ) (2.5 MG/3ML) 0.083% nebulizer solution Take 3 mLs (2.5 mg total) by nebulization every 6 (six) hours as needed for wheezing or shortness of breath. 02/26/24 02/25/25 Yes Cobb, Comer GAILS, NP  albuterol  (VENTOLIN  HFA) 108 (90 Base) MCG/ACT inhaler Inhale 2 puffs into the lungs every 6 (six) hours as needed for wheezing or shortness of breath.   Yes [provider]  aspirin  (ASPIRIN  CHILDRENS) 81 MG chewable tablet Chew 1 tablet (81 mg total) by mouth 2 (two) times daily with a meal. 05/19/24 07/03/24 Yes Hill, Valery RAMAN, PA-C  [Paused] aspirin  EC 81 MG tablet Take 1 tablet (81 mg total) by mouth daily. Swallow whole. Wait to take this until your doctor or other care provider tells you to start again. 12/12/23  Yes Shah, Pratik D, DO   Budeson-Glycopyrrol-Formoterol  (BREZTRI  AEROSPHERE) 160-9-4.8 MCG/ACT AERO Inhale 2 puffs into the lungs daily.   Yes [provider]  Camphor-Menthol -Methyl Sal 1.2-5.7-6.3 % PTCH Apply 1 patch topically daily as needed (bilateral hip pain).   Yes [provider]  Capsicum, Cayenne, (CAYENNE PEPPER PO) Take 3 g by mouth See admin instructions. Mix 3 grams (1/2 teaspoonful) of cayenne pepper into 2 ounces of water  and drink by mouth once a day   Yes [provider]  escitalopram  (LEXAPRO ) 20 MG tablet Take 20 mg by mouth See admin instructions. Take 20 mg by mouth in the morning and after supper   Yes [provider]  guaiFENesin  (MUCINEX ) 600 MG 12 hr tablet Take 2 tablets (1,200 mg total) by mouth 2 (two) times daily. Patient taking differently: Take 600 mg by mouth 2 (two) times daily. 02/26/24  Yes Cobb, Comer GAILS, NP  HYDROcodone -acetaminophen  (NORCO/VICODIN) 5-325 MG tablet Take 1 tablet by mouth every 4 (four) hours as needed for up to 7 days for moderate pain (pain score 4-6) or severe pain (pain score 7-10). 05/19/24 05/26/24 Yes Hill, Valery RAMAN, PA-C  ibuprofen  (ADVIL ) 200 MG tablet Take 800 mg by mouth every 8 (eight) hours as needed for mild pain (pain score 1-3), headache or cramping.   Yes [provider]  omeprazole (PRILOSEC) 20 MG capsule Take 20 mg by mouth daily before breakfast.   Yes [provider]  OXYGEN Inhale 2 L/min into the lungs See admin instructions. Inhale 2 L/min into the lungs for 30 minutes THREE times a day   Yes [provider]  sodium chloride  HYPERTONIC 3 % nebulizer solution Take by nebulization in the morning and at bedtime. 3 mL each treatment Patient taking differently: Take 4 mLs by nebulization in the morning and at bedtime. 02/26/24  Yes Cobb, Comer GAILS, NP  tamsulosin  (FLOMAX ) 0.4 MG CAPS capsule Take 0.4 mg by mouth in the morning and at bedtime.   Yes [provider]  amLODipine   (NORVASC ) 5  MG tablet Take 1 tablet (5 mg total) by mouth daily. Patient not taking: Reported on 05/17/2024 12/12/23   Maree Bracken D, DO  predniSONE  (DELTASONE ) 10 MG tablet 4 tabs for 2 days, then 3 tabs for 2 days, 2 tabs for 2 days, then 1 tab for 2 days, then stop Patient not taking: Reported on 05/17/2024 02/26/24   Malachy Comer GAILS, NP        Total critical care time spent by me: 90 minutes   Critical care time was exclusive of separately billable procedures and the treatment of any other patient.   Critical care was necessary to treat or prevent imminent or life-threatening deterioration.  Critical care was time spent personally by me on the following activities: development of treatment plan with patient and/or surrogate as well as nursing, discussions with consultants, re-evaluation of the patient's condition and their response to treatment, examination of patient, obtaining history from patient or surrogate, ordering and performing treatments and interventions, ordering and review of laboratory studies, ordering and review of radiographic studies, and participation in multidisciplinary rounds.  Lamar Dales, MD Pulmonary, Critical Care [facility] Sleep Medicine [facility]  7a-7p: For contact information, see AMION. If no response to pager, call PCCM Consults pager. After 7p: Call E-Link.      [1]  Allergies Allergen Reactions   Statins Other (See Comments)    LEG CRAMPS   Latex Hives and Rash   Morphine And Codeine Hives   "

## 2024-05-25 NOTE — Plan of Care (Signed)
" °  Problem: Clinical Measurements: Goal: Respiratory complications will improve Outcome: Not Progressing Goal: Cardiovascular complication will be avoided Outcome: Not Progressing   Problem: Activity: Goal: Risk for activity intolerance will decrease Outcome: Not Progressing   Problem: Coping: Goal: Level of anxiety will decrease Outcome: Not Progressing   Problem: Safety: Goal: Ability to remain free from injury will improve Outcome: Not Progressing   Problem: Safety: Goal: Non-violent Restraint(s) Outcome: Not Progressing   Problem: Activity: Goal: Ability to avoid complications of mobility impairment will improve Outcome: Not Progressing Goal: Ability to tolerate increased activity will improve Outcome: Not Progressing   Problem: Clinical Measurements: Goal: Postoperative complications will be avoided or minimized Outcome: Not Progressing   "

## 2024-05-26 ENCOUNTER — Inpatient Hospital Stay (HOSPITAL_COMMUNITY)

## 2024-05-26 DIAGNOSIS — J690 Pneumonitis due to inhalation of food and vomit: Secondary | ICD-10-CM

## 2024-05-26 DIAGNOSIS — G9341 Metabolic encephalopathy: Secondary | ICD-10-CM | POA: Diagnosis not present

## 2024-05-26 DIAGNOSIS — F10931 Alcohol use, unspecified with withdrawal delirium: Secondary | ICD-10-CM | POA: Diagnosis not present

## 2024-05-26 LAB — CBC
HCT: 35.6 % — ABNORMAL LOW (ref 39.0–52.0)
Hemoglobin: 11.4 g/dL — ABNORMAL LOW (ref 13.0–17.0)
MCH: 34.2 pg — ABNORMAL HIGH (ref 26.0–34.0)
MCHC: 32 g/dL (ref 30.0–36.0)
MCV: 106.9 fL — ABNORMAL HIGH (ref 80.0–100.0)
Platelets: 188 10*3/uL (ref 150–400)
RBC: 3.33 MIL/uL — ABNORMAL LOW (ref 4.22–5.81)
RDW: 12.4 % (ref 11.5–15.5)
WBC: 10 10*3/uL (ref 4.0–10.5)
nRBC: 0 % (ref 0.0–0.2)

## 2024-05-26 LAB — BASIC METABOLIC PANEL WITH GFR
Anion gap: 7 (ref 5–15)
BUN: 33 mg/dL — ABNORMAL HIGH (ref 8–23)
CO2: 28 mmol/L (ref 22–32)
Calcium: 8.8 mg/dL — ABNORMAL LOW (ref 8.9–10.3)
Chloride: 108 mmol/L (ref 98–111)
Creatinine, Ser: 0.85 mg/dL (ref 0.61–1.24)
GFR, Estimated: >60 mL/min
Glucose, Bld: 139 mg/dL — ABNORMAL HIGH (ref 70–99)
Potassium: 3.9 mmol/L (ref 3.5–5.1)
Sodium: 143 mmol/L (ref 135–145)

## 2024-05-26 LAB — GLUCOSE, CAPILLARY
Glucose-Capillary: 110 mg/dL — ABNORMAL HIGH (ref 70–99)
Glucose-Capillary: 115 mg/dL — ABNORMAL HIGH (ref 70–99)
Glucose-Capillary: 130 mg/dL — ABNORMAL HIGH (ref 70–99)
Glucose-Capillary: 134 mg/dL — ABNORMAL HIGH (ref 70–99)
Glucose-Capillary: 152 mg/dL — ABNORMAL HIGH (ref 70–99)
Glucose-Capillary: 160 mg/dL — ABNORMAL HIGH (ref 70–99)

## 2024-05-26 LAB — MAGNESIUM: Magnesium: 2.3 mg/dL (ref 1.7–2.4)

## 2024-05-26 LAB — PHOSPHORUS: Phosphorus: 3 mg/dL (ref 2.5–4.6)

## 2024-05-26 LAB — TRIGLYCERIDES: Triglycerides: 146 mg/dL

## 2024-05-26 MED ORDER — ORAL CARE MOUTH RINSE
15.0000 mL | OROMUCOSAL | Status: AC
  Administered 2024-05-26 – 2024-05-27 (×13): 15 mL via OROMUCOSAL

## 2024-05-26 MED ORDER — BISACODYL 10 MG RE SUPP: 10.0000 mg | Freq: Every day | RECTAL | Status: AC | PRN

## 2024-05-26 MED ORDER — ONDANSETRON HCL 4 MG PO TABS: 4.0000 mg | ORAL_TABLET | Freq: Four times a day (QID) | ORAL | Status: AC | PRN

## 2024-05-26 MED ORDER — MIDAZOLAM HCL (PF) 2 MG/2ML IJ SOLN: 2.0000 mg | Freq: Once | INTRAMUSCULAR | Status: AC

## 2024-05-26 MED ORDER — ORAL CARE MOUTH RINSE: 15.0000 mL | OROMUCOSAL | Status: AC | PRN

## 2024-05-26 MED ORDER — ONDANSETRON HCL 4 MG/2ML IJ SOLN: 4.0000 mg | Freq: Four times a day (QID) | INTRAMUSCULAR | Status: AC | PRN

## 2024-05-26 MED ORDER — MIDAZOLAM HCL 2 MG/2ML IJ SOLN
INTRAMUSCULAR | Status: AC
  Administered 2024-05-26: 2 mg via INTRAVENOUS
  Filled 2024-05-26: qty 2

## 2024-05-26 MED ORDER — ROCURONIUM BROMIDE 10 MG/ML (PF) SYRINGE
100.0000 mg | PREFILLED_SYRINGE | Freq: Once | INTRAVENOUS | Status: AC
  Filled 2024-05-26: qty 10

## 2024-05-26 MED ORDER — ALUM & MAG HYDROXIDE-SIMETH 200-200-20 MG/5ML PO SUSP: 30.0000 mL | ORAL | Status: AC | PRN

## 2024-05-26 MED ORDER — TRAZODONE HCL 50 MG PO TABS: 25.0000 mg | ORAL_TABLET | Freq: Every evening | ORAL | Status: AC | PRN

## 2024-05-26 NOTE — Progress Notes (Signed)
 "  NAME:  Ralph [name], MRN:  [id number], DOB:  [birth year], LOS: 9 ADMISSION DATE:  05/17/2024, CONSULTATION DATE:  05/22/24 REFERRING MD:  TRH, CHIEF COMPLAINT:  hip pain   History of Present Illness:  74 year old man w/ hx of COPD, HTN, HLD, etoh abuse who presented after mechanical fall found to have R femoral neck fx tentatively planned for THA tomorrow.  Unfortunately pretty bad EtOH w/d despite ativan  so PCCM consulted to assist.  Patient is moaning and wiggling around in bed non-redirectable so hx per chart review.  Pertinent  Medical History   Past Medical History:  Diagnosis Date   Arthritis    hands   Asthma    GERD (gastroesophageal reflux disease)    History of kidney stones    x2  -episodes   Hyperlipidemia    Renal disorder    Varicose veins    bilateral surgeries- no problems now   Significant Hospital Events: Including procedures, antibiotic start and stop dates in addition to other pertinent events   1/27 admit 1/31 PCCM consult. Phenobarbital  5 mg/kg load. 2/1 Upgrade to ICU status for management of combined EtOH withdrawal, coordination of orthopedic surgery regarding operative mgmt of femoral neck fx, and airway management. Underwent operative repair.  Interim History / Subjective:  No additional events overnight. This AM, breathing 20/min on ventilator on PSV 12/8, 100% => 97-99% SpO2. Reduced FiO2 to 80%. When not stimulated whatsoever, he is mostly calm. When turned or in-line suctioned, significantly more agitation.  Objective    Blood pressure 117/66, pulse 80, temperature 98.7 F (37.1 C), temperature source Oral, resp. rate 16, height 5' 11.5 (1.816 m), weight 90.6 kg, SpO2 96%. CVP:  [6 mmHg-8 mmHg] 8 mmHg  Vent Mode: PSV FiO2 (%):  [40 %-100 %] 60 % PEEP:  [5 cmH20-8 cmH20] 8 cmH20 Pressure Support:  [12 cmH20] 12 cmH20   Intake/Output Summary (Last 24 hours) at 05/26/2024 9062 Last data filed at 05/26/2024 0800 Gross per 24 hour  Intake  3133.94 ml  Output 655 ml  Net 2478.94 ml   Filed Weights   05/21/24 2352 05/25/24 0400 05/26/24 0400  Weight: 89.7 kg 88.9 kg 90.6 kg    Examination: General: oscillates between calm and sedated with RR in 20s to severely agitated and breathing 35-40/min HENT: MM dry, trachea midline Lungs: Coarse mechanical breath sounds Cardiovascular: regular rate and rhythm Abdomen: soft, +BS GU: Foley   Resolved problem list   Assessment and Plan  Acute metabolic encephalopathy Severe alcohol withdrawal syndrome / delirium tremens R femoral neck fracture s/p R THR Aspiration pneumonia HTN/COPD/BPH/Depression  S/p 10 mg/kg IBW phenobarbital  load, 5 mg/kg rescue dose. Remains on propofol , Precedex , fentanyl  Continue ceftriaxone  for aspiration pneumonia F/u repeat tracheal aspirate cx QUERY undiagnosed COPD S/p R THR: Need to get him extubated so he can engage with PT as soon as possible Tube feeds currently on hold Will discuss with RN possible use of external urinary catheter rather than Foley Will re-attempt weaning of ventilator today Daughter updated at bedside yesterday  Labs   CBC: Recent Labs  Lab 05/22/24 1022 05/23/24 0543 05/24/24 0253 05/24/24 1444 05/25/24 0400 05/26/24 0400  WBC 9.7 9.3 10.6*  --  10.0 10.0  HGB 13.8 12.9* 13.4 12.2* 12.1* 11.4*  HCT 40.0 37.6* 41.0 36.0* 37.9* 35.6*  MCV 102.3* 103.3* 106.2*  --  107.4* 106.9*  PLT 200 193 172  --  147* 188    Basic Metabolic Panel: Recent Labs  Lab 05/22/24 1022 05/23/24 0543 05/24/24 0253 05/24/24 1444 05/25/24 0400 05/26/24 0400  NA 145 145 143 137 140 143  K 4.1 4.0 4.1 3.8 3.9 3.9  CL 106 107 106  --  107 108  CO2 28 31 27   --  26 28  GLUCOSE 135* 110* 156*  --  165* 139*  BUN 37* 34* 28*  --  26* 33*  CREATININE 0.93 1.11 0.89  --  0.71 0.85  CALCIUM  9.6 9.1 9.1  --  8.8* 8.8*  MG 2.8* 2.8* 2.5*  --  2.2 2.3  PHOS  --  3.2 2.5  --  2.7 3.0   GFR: Estimated Creatinine Clearance: 83.8  mL/min (by C-G formula based on SCr of 0.85 mg/dL). Recent Labs  Lab 05/23/24 0543 05/24/24 0253 05/25/24 0400 05/26/24 0400  WBC 9.3 10.6* 10.0 10.0  LATICACIDVEN  --   --  1.8  --     Liver Function Tests: Recent Labs  Lab 05/20/24 1050 05/22/24 1022  AST 28 44*  ALT 23 36  ALKPHOS 60 58  BILITOT 1.6* 0.9  PROT 6.9 6.7  ALBUMIN 3.9 3.9   Recent Labs  Lab 05/24/24 1455  LIPASE 45   Recent Labs  Lab 05/20/24 1050  AMMONIA 16    ABG    Component Value Date/Time   PHART 7.545 (H) 05/24/2024 1444   PCO2ART 23.7 (L) 05/24/2024 1444   PO2ART 148 (H) 05/24/2024 1444   HCO3 26.6 05/24/2024 1455   TCO2 21 (L) 05/24/2024 1444   ACIDBASEDEF 1.0 05/24/2024 1444   O2SAT 71.8 05/24/2024 1455     Coagulation Profile: No results for input(s): INR, PROTIME in the last 168 hours.  Cardiac Enzymes: No results for input(s): CKTOTAL, CKMB, CKMBINDEX, TROPONINI in the last 168 hours.  HbA1C: Hgb A1c MFr Bld  Date/Time Value Ref Range Status  05/25/2023 04:21 AM 5.2 4.8 - 5.6 % Final    Comment:    (NOTE) Pre diabetes:          5.7%-6.4%  Diabetes:              >6.4%  Glycemic control for   <7.0% adults with diabetes     CBG: Recent Labs  Lab 05/25/24 1733 05/25/24 1935 05/25/24 2341 05/26/24 0334 05/26/24 0734  GLUCAP 163* 136* 115* 130* 115*    Past Medical History:  He,  has a past medical history of Arthritis, Asthma, GERD (gastroesophageal reflux disease), History of kidney stones, Hyperlipidemia, Renal disorder, and Varicose veins.   Surgical History:   Past Surgical History:  Procedure Laterality Date   COLONOSCOPY WITH PROPOFOL  N/A 03/20/2015   Procedure: COLONOSCOPY WITH PROPOFOL ;  Surgeon: Gladis MARLA Louder, MD;  Location: [facility];  Service: Endoscopy;  Laterality: N/A;   HERNIA REPAIR     inguinal hernia   TONSILLECTOMY     TOTAL HIP ARTHROPLASTY Right 05/22/2024   Procedure: ARTHROPLASTY, HIP, TOTAL, ANTERIOR APPROACH;   Surgeon: Ernie Cough, MD;  Location: [facility];  Service: Orthopedics;  Laterality: Right;   VARICOSE VEIN SURGERY Bilateral    2 years ago   VASECTOMY       Social History:   reports that he quit smoking about 11 years ago. His smoking use included cigarettes. He started smoking about 31 years ago. He has a 10 pack-year smoking history. He has never used smokeless tobacco. He reports current alcohol use. He reports that he does not use drugs.   Family History:  His family  history includes COPD in his father; Dementia in his mother.   Allergies Allergies[1]   Home Medications  Prior to Admission medications  Medication Sig Start Date End Date Taking? Authorizing Provider  albuterol  (PROVENTIL ) (2.5 MG/3ML) 0.083% nebulizer solution Take 3 mLs (2.5 mg total) by nebulization every 6 (six) hours as needed for wheezing or shortness of breath. 02/26/24 02/25/25 Yes Cobb, Comer GAILS, NP  albuterol  (VENTOLIN  HFA) 108 (90 Base) MCG/ACT inhaler Inhale 2 puffs into the lungs every 6 (six) hours as needed for wheezing or shortness of breath.   Yes [provider]  aspirin  (ASPIRIN  CHILDRENS) 81 MG chewable tablet Chew 1 tablet (81 mg total) by mouth 2 (two) times daily with a meal. 05/19/24 07/03/24 Yes Hill, Valery RAMAN, PA-C  [Paused] aspirin  EC 81 MG tablet Take 1 tablet (81 mg total) by mouth daily. Swallow whole. Wait to take this until your doctor or other care provider tells you to start again. 12/12/23  Yes Shah, Pratik D, DO  Budeson-Glycopyrrol-Formoterol  (BREZTRI  AEROSPHERE) 160-9-4.8 MCG/ACT AERO Inhale 2 puffs into the lungs daily.   Yes [provider]  Camphor-Menthol -Methyl Sal 1.2-5.7-6.3 % PTCH Apply 1 patch topically daily as needed (bilateral hip pain).   Yes [provider]  Capsicum, Cayenne, (CAYENNE PEPPER PO) Take 3 g by mouth See admin instructions. Mix 3 grams (1/2 teaspoonful) of cayenne pepper into 2 ounces of water  and drink by mouth once a day   Yes  [provider]  escitalopram  (LEXAPRO ) 20 MG tablet Take 20 mg by mouth See admin instructions. Take 20 mg by mouth in the morning and after supper   Yes [provider]  guaiFENesin  (MUCINEX ) 600 MG 12 hr tablet Take 2 tablets (1,200 mg total) by mouth 2 (two) times daily. Patient taking differently: Take 600 mg by mouth 2 (two) times daily. 02/26/24  Yes Cobb, Comer GAILS, NP  HYDROcodone -acetaminophen  (NORCO/VICODIN) 5-325 MG tablet Take 1 tablet by mouth every 4 (four) hours as needed for up to 7 days for moderate pain (pain score 4-6) or severe pain (pain score 7-10). 05/19/24 05/26/24 Yes Hill, Valery RAMAN, PA-C  ibuprofen  (ADVIL ) 200 MG tablet Take 800 mg by mouth every 8 (eight) hours as needed for mild pain (pain score 1-3), headache or cramping.   Yes [provider]  omeprazole (PRILOSEC) 20 MG capsule Take 20 mg by mouth daily before breakfast.   Yes [provider]  OXYGEN Inhale 2 L/min into the lungs See admin instructions. Inhale 2 L/min into the lungs for 30 minutes THREE times a day   Yes [provider]  sodium chloride  HYPERTONIC 3 % nebulizer solution Take by nebulization in the morning and at bedtime. 3 mL each treatment Patient taking differently: Take 4 mLs by nebulization in the morning and at bedtime. 02/26/24  Yes Cobb, Comer GAILS, NP  tamsulosin  (FLOMAX ) 0.4 MG CAPS capsule Take 0.4 mg by mouth in the morning and at bedtime.   Yes [provider]  amLODipine  (NORVASC ) 5 MG tablet Take 1 tablet (5 mg total) by mouth daily. Patient not taking: Reported on 05/17/2024 12/12/23   Maree Bracken D, DO  predniSONE  (DELTASONE ) 10 MG tablet 4 tabs for 2 days, then 3 tabs for 2 days, 2 tabs for 2 days, then 1 tab for 2 days, then stop Patient not taking: Reported on 05/17/2024 02/26/24   Malachy Comer GAILS, NP        Total critical care time spent by me: 60  minutes   Critical care time was exclusive of separately billable procedures and  the treatment of any other patient.   Critical care was necessary to treat or prevent imminent or life-threatening deterioration.  Critical care was time spent personally by me on the following activities: development of treatment plan with patient and/or surrogate as well as nursing, discussions with consultants, re-evaluation of the patient's condition and their response to treatment, examination of patient, obtaining history from patient or surrogate, ordering and performing treatments and interventions, ordering and review of laboratory studies, ordering and review of radiographic studies, and participation in multidisciplinary rounds.  Lamar Dales, MD Pulmonary, Critical Care & Sleep Medicine [facility]  7a-7p: For contact information, see AMION. If no response to pager, call PCCM Consults pager. After 7p: Call E-Link.      [1]  Allergies Allergen Reactions   Statins Other (See Comments)    LEG CRAMPS   Latex Hives and Rash   Morphine And Codeine Hives   "

## 2024-05-26 NOTE — Plan of Care (Signed)
" °  Problem: Clinical Measurements: Goal: Respiratory complications will improve Outcome: Not Progressing Goal: Cardiovascular complication will be avoided Outcome: Not Progressing   Problem: Activity: Goal: Risk for activity intolerance will decrease Outcome: Not Progressing   Problem: Coping: Goal: Level of anxiety will decrease Outcome: Not Progressing   Problem: Elimination: Goal: Will not experience complications related to bowel motility Outcome: Not Progressing Goal: Will not experience complications related to urinary retention Outcome: Not Progressing   Problem: Safety: Goal: Non-violent Restraint(s) Outcome: Not Progressing   Problem: Activity: Goal: Ability to avoid complications of mobility impairment will improve Outcome: Not Progressing Goal: Ability to tolerate increased activity will improve Outcome: Not Progressing   "

## 2024-05-26 NOTE — Progress Notes (Signed)
" ° °  Brief Progress Note   _____________________________________________________________________________________________________________  Patient Name: Ralph [name] Patient DOB: 05/08/1950 Date: 05/26/2024      Data: Charlet out to Harlene RN about restraint order.    Action: Secure chat sent to [facility] NP and Dr Olena for restraint renewal.    Response:  New restraint prder placed. Updated Harlene RN via secure chat.   _____________________________________________________________________________________________________________  [facility] Patient Care Command RN Expeditor Keilin Gamboa Please contact us  directly via secure chat (search for [facility]) or by calling us  at [phone] [facility]).  "

## 2024-05-26 NOTE — Procedures (Addendum)
 Bronchoscopy Procedure Note  ERIVERTO BYRNES  [id number]  1951-03-13  Date:05/26/24  Time:3:58 PM   Provider Performing:Lilith A [name]   Procedure(s):  Flexible bronchoscopy with bronchial alveolar lavage (68375)  Indication(s) Mucous plugging, copious secretions  Consent Risks of the procedure as well as the alternatives and risks of each were explained to the patient and/or caregiver.  Consent for the procedure was obtained and is signed in the bedside chart  Anesthesia On propofol  and fentanyl  gtt   Time Out Verified patient identification, verified procedure, site/side was marked, verified correct patient position, special equipment/implants available, medications/allergies/relevant history reviewed, required imaging and test results available.   Sterile Technique Usual hand hygiene, masks, gowns, and gloves were used   Procedure Description Bronchoscope advanced through endotracheal tube and into airway.  Airways were examined down to subsegmental level with findings noted below.   Following diagnostic evaluation, BAL(s) performed in 15cc with normal saline and return of 15cc fluid  Findings: thick mucous secretions throughout L&R mainstem bronchi. L >R   Complications/Tolerance None; patient tolerated the procedure well. Chest X-ray is not needed post procedure.   EBL Minimal   Specimen(s) Left lower lobe basilar segment BAL Tracheal washings (L)  Alveria DELENA [name], NP [facility] 05/26/24 4:00 PM  Please see [email or web address] for pager details  From 7A-7P. If no response, please call [phone] After hours, please call Elink: [phone]    ---  Procedure was performed in part by me. Furthermore, I was present for and supervised the entirety of the procedure.  Lamar JINNY Dales, MD

## 2024-05-27 LAB — BASIC METABOLIC PANEL WITH GFR
Anion gap: 8 (ref 5–15)
Anion gap: 10 (ref 5–15)
BUN: 31 mg/dL — ABNORMAL HIGH (ref 8–23)
BUN: 33 mg/dL — ABNORMAL HIGH (ref 8–23)
CO2: 27 mmol/L (ref 22–32)
CO2: 29 mmol/L (ref 22–32)
Calcium: 8.5 mg/dL — ABNORMAL LOW (ref 8.9–10.3)
Calcium: 8.6 mg/dL — ABNORMAL LOW (ref 8.9–10.3)
Chloride: 107 mmol/L (ref 98–111)
Chloride: 101 mmol/L (ref 98–111)
Creatinine, Ser: 0.91 mg/dL (ref 0.61–1.24)
Creatinine, Ser: 1.01 mg/dL (ref 0.61–1.24)
GFR, Estimated: >60 mL/min
GFR, Estimated: >60 mL/min
Glucose, Bld: 172 mg/dL — ABNORMAL HIGH (ref 70–99)
Glucose, Bld: 211 mg/dL — ABNORMAL HIGH (ref 70–99)
Potassium: 4.1 mmol/L (ref 3.5–5.1)
Potassium: 3.7 mmol/L (ref 3.5–5.1)
Sodium: 142 mmol/L (ref 135–145)
Sodium: 140 mmol/L (ref 135–145)

## 2024-05-27 LAB — GLUCOSE, CAPILLARY
Glucose-Capillary: 150 mg/dL — ABNORMAL HIGH (ref 70–99)
Glucose-Capillary: 193 mg/dL — ABNORMAL HIGH (ref 70–99)
Glucose-Capillary: 148 mg/dL — ABNORMAL HIGH (ref 70–99)
Glucose-Capillary: 160 mg/dL — ABNORMAL HIGH (ref 70–99)
Glucose-Capillary: 236 mg/dL — ABNORMAL HIGH (ref 70–99)

## 2024-05-27 LAB — CULTURE, BLOOD (ROUTINE X 2)
Culture: NO GROWTH
Culture: NO GROWTH
Special Requests: ADEQUATE

## 2024-05-27 LAB — MAGNESIUM: Magnesium: 2.1 mg/dL (ref 1.7–2.4)

## 2024-05-27 LAB — CBC
HCT: 34.6 % — ABNORMAL LOW (ref 39.0–52.0)
Hemoglobin: 11 g/dL — ABNORMAL LOW (ref 13.0–17.0)
MCH: 34.7 pg — ABNORMAL HIGH (ref 26.0–34.0)
MCHC: 31.8 g/dL (ref 30.0–36.0)
MCV: 109.1 fL — ABNORMAL HIGH (ref 80.0–100.0)
Platelets: 207 10*3/uL (ref 150–400)
RBC: 3.17 MIL/uL — ABNORMAL LOW (ref 4.22–5.81)
RDW: 12.6 % (ref 11.5–15.5)
WBC: 10.9 10*3/uL — ABNORMAL HIGH (ref 4.0–10.5)
nRBC: 0 % (ref 0.0–0.2)

## 2024-05-27 LAB — CULTURE, RESPIRATORY W GRAM STAIN

## 2024-05-27 MED ORDER — AMIODARONE HCL IN DEXTROSE 360-4.14 MG/200ML-% IV SOLN: 30.0000 mg/h | INTRAVENOUS | Status: AC

## 2024-05-27 MED ORDER — FUROSEMIDE 10 MG/ML IJ SOLN
80.0000 mg | Freq: Once | INTRAMUSCULAR | Status: AC
  Administered 2024-05-27: 80 mg via INTRAVENOUS
  Filled 2024-05-27: qty 8

## 2024-05-27 MED ORDER — OLANZAPINE 10 MG PO TABS
10.0000 mg | ORAL_TABLET | Freq: Every day | ORAL | Status: AC
  Administered 2024-05-27: 10 mg
  Filled 2024-05-27: qty 1

## 2024-05-27 MED ORDER — AMIODARONE LOAD VIA INFUSION
150.0000 mg | Freq: Once | INTRAVENOUS | Status: AC
  Administered 2024-05-27: 150 mg via INTRAVENOUS
  Filled 2024-05-27: qty 83.34

## 2024-05-27 MED ORDER — AMIODARONE HCL IN DEXTROSE 360-4.14 MG/200ML-% IV SOLN
60.0000 mg/h | INTRAVENOUS | Status: AC
  Administered 2024-05-27 (×2): 60 mg/h via INTRAVENOUS
  Filled 2024-05-27 (×2): qty 200

## 2024-05-27 MED ORDER — MIDAZOLAM HCL (PF) 2 MG/2ML IJ SOLN: 2.0000 mg | INTRAMUSCULAR | Status: AC | PRN

## 2024-05-27 MED ORDER — POTASSIUM CHLORIDE 20 MEQ PO PACK
40.0000 meq | PACK | Freq: Once | ORAL | Status: AC
  Administered 2024-05-27: 40 meq
  Filled 2024-05-27: qty 2

## 2024-05-27 NOTE — Progress Notes (Signed)
 Nutrition Follow-up  DOCUMENTATION CODES:   Not applicable  INTERVENTION:  Osmolite 1.5 at 55 ml/h (1320 ml per day) Prosource TF20 60 ml BID Provides 2140 kcal, 123 gm protein, 1006 ml free water  daily   - 100mg  thiamine  daily.    - FWF per CCM, currently ordered Q4H (1257mL/day).   NUTRITION DIAGNOSIS:   Inadequate oral intake related to inability to eat as evidenced by NPO status. *ongoing  GOAL:   Patient will meet greater than or equal to 90% of their needs *met with TF  MONITOR:   Vent status, Labs, Weight trends, TF tolerance  REASON FOR ASSESSMENT:   Consult Enteral/tube feeding initiation and management  ASSESSMENT:   74 year old male with PMH of COPD, HTN, HLD, etoh abuse who presented after mechanical fall found to have a right femoral neck fracture.  1/27 Admit 1/29 Ortho surgery cancelled due to patient actively withdrawing from etoh 2/1 s/p total right hip arthroplasty; returned to ICU intubated 2/2 TF initiated   Patient is currently intubated on ventilator support MV: 11.7 L/min Temp (24hrs), Avg:99.3 F (37.4 C), Min:97.6 F (36.4 C), Max:102 F (38.9 C)  Tube feeds remain at goal, patient tolerating well. Continue goal TF regimen.   Admit weight: 206# Current weight: 201# I&O's: 9.3 L since admit   Medications reviewed and include: 200mL Q4H FWF, MVI, 100mg  thiamine , Miralax , Senokot Fentanyl  Levophed  @ 2 mcg/min Propofol  @ 16.34mL/hr (provides 426 kcals over 24 hours)   Labs reviewed:  -  NUTRITION - FOCUSED PHYSICAL EXAM:  Attempt at next visit  Diet Order:   Diet Order             Diet NPO time specified  Diet effective now                   EDUCATION NEEDS:  Not appropriate for education at this time  Skin:  Skin Assessment: Skin Integrity Issues: Skin Integrity Issues:: Incisions Incisions: Right Hip  Last BM:  2/6 - type 7  Height:  Ht Readings from Last 1 Encounters:  05/25/24 5' 11.5 (1.816  m)   Weight:  Wt Readings from Last 1 Encounters:  05/27/24 91.2 kg    BMI:  Body mass index is 27.65 kg/m.  Estimated Nutritional Needs:  Kcal:  2000-2250 kcals Protein:  110-125 grams Fluid:  >/= 2L    Trude Ned RD, LDN Contact via Secure Chat.

## 2024-05-27 NOTE — Progress Notes (Addendum)
 eLink Physician-Brief Progress Note Patient Name: Ralph [name] DOB: 1950-05-15 MRN: [id number]   Date of Service  05/27/2024  HPI/Events of Note  Afib with RVR On sedation with propofol  and fent. Well sedated.  K 3.7.  EKG SVT.   eICU Interventions  Amiodarone  gtt Replace K Check Mg- 2.1        [name] 05/27/2024, 7:55 PM

## 2024-05-27 NOTE — Progress Notes (Signed)
 Patients heart rate elevated at this time. Hold CPT per RN.

## 2024-05-27 NOTE — Progress Notes (Signed)
 "  NAME:  Ralph [name], MRN:  [id number], DOB:  [birth year], LOS: 10 ADMISSION DATE:  05/17/2024, CONSULTATION DATE:  05/22/24 REFERRING MD:  TRH, CHIEF COMPLAINT:  hip pain   History of Present Illness:  74 year old man w/ hx of COPD, HTN, HLD, etoh abuse who presented after mechanical fall found to have R femoral neck fx tentatively planned for THA tomorrow.  Unfortunately pretty bad EtOH w/d despite ativan  so PCCM consulted to assist.  Patient is moaning and wiggling around in bed non-redirectable so hx per chart review.  Pertinent  Medical History   Past Medical History:  Diagnosis Date   Arthritis    hands   Asthma    GERD (gastroesophageal reflux disease)    History of kidney stones    x2  -episodes   Hyperlipidemia    Renal disorder    Varicose veins    bilateral surgeries- no problems now   Significant Hospital Events: Including procedures, antibiotic start and stop dates in addition to other pertinent events   1/27 admit 1/31 PCCM consult. Phenobarbital  5 mg/kg load. 2/1 Upgrade to ICU status for management of combined EtOH withdrawal, coordination of orthopedic surgery regarding operative mgmt of femoral neck fx, and airway management. Underwent operative repair.  Interim History / Subjective:  Overnight FiO2 down to 50%.  He is well outside withdrawal window now admitted 10 days.  Will stop Precedex .  Wean sedation as tolerated.  Hopefully will have a more stable day.  Add antipsychotic given ongoing agitation more likely delirium than withdrawal.  Start diuresis, left-sided pleural effusion, small right effusion noted on recent x-ray.  Objective    Blood pressure 111/61, pulse 68, temperature 98.6 F (37 C), temperature source Oral, resp. rate (!) 25, height 5' 11.5 (1.816 m), weight 91.2 kg, SpO2 93%. CVP:  [9 mmHg-16 mmHg] 12 mmHg  Vent Mode: PRVC FiO2 (%):  [50 %-100 %] 50 % Set Rate:  [15 bmp-19 bmp] 15 bmp Vt Set:  [610 mL] 610 mL PEEP:  [8 cmH20] 8  cmH20 Pressure Support:  [12 cmH20] 12 cmH20 Plateau Pressure:  [16 cmH20-24 cmH20] 16 cmH20   Intake/Output Summary (Last 24 hours) at 05/27/2024 0845 Last data filed at 05/27/2024 9183 Gross per 24 hour  Intake 3360.67 ml  Output 1200 ml  Net 2160.67 ml   Filed Weights   05/25/24 0400 05/26/24 0400 05/27/24 0330  Weight: 88.9 kg 90.6 kg 91.2 kg    Examination: General: RASS -4, deeply sedated  Eyes: EOMI Neck: No JVP appreciated at head of bed approximately 30 degrees Lungs: Clear ventilated breath sounds bilaterally Cardiovascular: Regular rate and rhythm Abdomen: Nondistended   Resolved problem list   Assessment and Plan  Acute metabolic encephalopathy due to delirium: --Well outside of withdrawal window given duration of admission --Start Zyprexa , continue as needed Haldol  added --RASS goal -1 added, stop Precedex , continue propofol  and fentanyl  per goal, fentanyl  as needed available, midazolam  as needed added  Severe alcohol withdrawal syndrome / delirium tremens --No longer clinically relevant  R femoral neck fracture s/p R THR --Routine postoperative care  Acute hypoxemic respiratory failure due to aspiration pneumonia --Continue ceftriaxone , follow-up BAL cultures from 2/5  Ventilator dependence due to acute hypoxemic respiratory failure and encephalopathy: --PRVC, VAP bundle, stress ulcer prophylaxis --Start IV diuresis to aid in ventilator weaning   Labs   CBC: Recent Labs  Lab 05/23/24 0543 05/24/24 0253 05/24/24 1444 05/25/24 0400 05/26/24 0400 05/27/24 0323  WBC 9.3 10.6*  --  10.0 10.0 10.9*  HGB 12.9* 13.4 12.2* 12.1* 11.4* 11.0*  HCT 37.6* 41.0 36.0* 37.9* 35.6* 34.6*  MCV 103.3* 106.2*  --  107.4* 106.9* 109.1*  PLT 193 172  --  147* 188 207    Basic Metabolic Panel: Recent Labs  Lab 05/22/24 1022 05/23/24 0543 05/24/24 0253 05/24/24 1444 05/25/24 0400 05/26/24 0400 05/27/24 0323  NA 145 145 143 137 140 143 142  K 4.1 4.0 4.1  3.8 3.9 3.9 4.1  CL 106 107 106  --  107 108 107  CO2 28 31 27   --  26 28 27   GLUCOSE 135* 110* 156*  --  165* 139* 172*  BUN 37* 34* 28*  --  26* 33* 31*  CREATININE 0.93 1.11 0.89  --  0.71 0.85 0.91  CALCIUM  9.6 9.1 9.1  --  8.8* 8.8* 8.5*  MG 2.8* 2.8* 2.5*  --  2.2 2.3  --   PHOS  --  3.2 2.5  --  2.7 3.0  --    GFR: Estimated Creatinine Clearance: 78.2 mL/min (by C-G formula based on SCr of 0.91 mg/dL). Recent Labs  Lab 05/24/24 0253 05/25/24 0400 05/26/24 0400 05/27/24 0323  WBC 10.6* 10.0 10.0 10.9*  LATICACIDVEN  --  1.8  --   --     Liver Function Tests: Recent Labs  Lab 05/20/24 1050 05/22/24 1022  AST 28 44*  ALT 23 36  ALKPHOS 60 58  BILITOT 1.6* 0.9  PROT 6.9 6.7  ALBUMIN 3.9 3.9   Recent Labs  Lab 05/24/24 1455  LIPASE 45   Recent Labs  Lab 05/20/24 1050  AMMONIA 16    ABG    Component Value Date/Time   PHART 7.545 (H) 05/24/2024 1444   PCO2ART 23.7 (L) 05/24/2024 1444   PO2ART 148 (H) 05/24/2024 1444   HCO3 26.6 05/24/2024 1455   TCO2 21 (L) 05/24/2024 1444   ACIDBASEDEF 1.0 05/24/2024 1444   O2SAT 71.8 05/24/2024 1455     Coagulation Profile: No results for input(s): INR, PROTIME in the last 168 hours.  Cardiac Enzymes: No results for input(s): CKTOTAL, CKMB, CKMBINDEX, TROPONINI in the last 168 hours.  HbA1C: Hgb A1c MFr Bld  Date/Time Value Ref Range Status  05/25/2023 04:21 AM 5.2 4.8 - 5.6 % Final    Comment:    (NOTE) Pre diabetes:          5.7%-6.4%  Diabetes:              >6.4%  Glycemic control for   <7.0% adults with diabetes     CBG: Recent Labs  Lab 05/26/24 1722 05/26/24 2013 05/26/24 2335 05/27/24 0404 05/27/24 0751  GLUCAP 110* 134* 152* 150* 193*    Past Medical History:  He,  has a past medical history of Arthritis, Asthma, GERD (gastroesophageal reflux disease), History of kidney stones, Hyperlipidemia, Renal disorder, and Varicose veins.   Surgical History:   Past Surgical  History:  Procedure Laterality Date   COLONOSCOPY WITH PROPOFOL  N/A 03/20/2015   Procedure: COLONOSCOPY WITH PROPOFOL ;  Surgeon: Gladis MARLA Louder, MD;  Location: [facility];  Service: Endoscopy;  Laterality: N/A;   HERNIA REPAIR     inguinal hernia   TONSILLECTOMY     TOTAL HIP ARTHROPLASTY Right 05/22/2024   Procedure: ARTHROPLASTY, HIP, TOTAL, ANTERIOR APPROACH;  Surgeon: Ernie Cough, MD;  Location: [facility];  Service: Orthopedics;  Laterality: Right;   VARICOSE VEIN SURGERY Bilateral    2 years ago   VASECTOMY  Social History:   reports that he quit smoking about 11 years ago. His smoking use included cigarettes. He started smoking about 31 years ago. He has a 10 pack-year smoking history. He has never used smokeless tobacco. He reports current alcohol use. He reports that he does not use drugs.   Family History:  His family history includes COPD in his father; Dementia in his mother.   Allergies Allergies[1]   Home Medications  Prior to Admission medications  Medication Sig Start Date End Date Taking? Authorizing Provider  albuterol  (PROVENTIL ) (2.5 MG/3ML) 0.083% nebulizer solution Take 3 mLs (2.5 mg total) by nebulization every 6 (six) hours as needed for wheezing or shortness of breath. 02/26/24 02/25/25 Yes Cobb, Comer GAILS, NP  albuterol  (VENTOLIN  HFA) 108 (90 Base) MCG/ACT inhaler Inhale 2 puffs into the lungs every 6 (six) hours as needed for wheezing or shortness of breath.   Yes [provider]  aspirin  (ASPIRIN  CHILDRENS) 81 MG chewable tablet Chew 1 tablet (81 mg total) by mouth 2 (two) times daily with a meal. 05/19/24 07/03/24 Yes Hill, Valery RAMAN, PA-C  [Paused] aspirin  EC 81 MG tablet Take 1 tablet (81 mg total) by mouth daily. Swallow whole. Wait to take this until your doctor or other care provider tells you to start again. 12/12/23  Yes Shah, Pratik D, DO  Budeson-Glycopyrrol-Formoterol  (BREZTRI  AEROSPHERE) 160-9-4.8 MCG/ACT AERO Inhale 2 puffs into the  lungs daily.   Yes [provider]  Camphor-Menthol -Methyl Sal 1.2-5.7-6.3 % PTCH Apply 1 patch topically daily as needed (bilateral hip pain).   Yes [provider]  Capsicum, Cayenne, (CAYENNE PEPPER PO) Take 3 g by mouth See admin instructions. Mix 3 grams (1/2 teaspoonful) of cayenne pepper into 2 ounces of water  and drink by mouth once a day   Yes [provider]  escitalopram  (LEXAPRO ) 20 MG tablet Take 20 mg by mouth See admin instructions. Take 20 mg by mouth in the morning and after supper   Yes [provider]  guaiFENesin  (MUCINEX ) 600 MG 12 hr tablet Take 2 tablets (1,200 mg total) by mouth 2 (two) times daily. Patient taking differently: Take 600 mg by mouth 2 (two) times daily. 02/26/24  Yes Cobb, Comer GAILS, NP  HYDROcodone -acetaminophen  (NORCO/VICODIN) 5-325 MG tablet Take 1 tablet by mouth every 4 (four) hours as needed for up to 7 days for moderate pain (pain score 4-6) or severe pain (pain score 7-10). 05/19/24 05/26/24 Yes Hill, Valery RAMAN, PA-C  ibuprofen  (ADVIL ) 200 MG tablet Take 800 mg by mouth every 8 (eight) hours as needed for mild pain (pain score 1-3), headache or cramping.   Yes [provider]  omeprazole (PRILOSEC) 20 MG capsule Take 20 mg by mouth daily before breakfast.   Yes [provider]  OXYGEN Inhale 2 L/min into the lungs See admin instructions. Inhale 2 L/min into the lungs for 30 minutes THREE times a day   Yes [provider]  sodium chloride  HYPERTONIC 3 % nebulizer solution Take by nebulization in the morning and at bedtime. 3 mL each treatment Patient taking differently: Take 4 mLs by nebulization in the morning and at bedtime. 02/26/24  Yes Cobb, Comer GAILS, NP  tamsulosin  (FLOMAX ) 0.4 MG CAPS capsule Take 0.4 mg by mouth in the morning and at bedtime.   Yes [provider]  amLODipine  (NORVASC ) 5 MG tablet Take 1 tablet (5 mg total) by mouth daily. Patient not taking: Reported on 05/17/2024  12/12/23   Maree Bracken D,  DO  predniSONE  (DELTASONE ) 10 MG tablet 4 tabs for 2 days, then 3 tabs for 2 days, 2 tabs for 2 days, then 1 tab for 2 days, then stop Patient not taking: Reported on 05/17/2024 02/26/24   Malachy Comer GAILS, NP        Total critical care time spent by me: 33 minutes   Critical care time was exclusive of separately billable procedures and the treatment of any other patient.   Critical care was necessary to treat or prevent imminent or life-threatening deterioration.  Critical care was time spent personally by me on the following activities: development of treatment plan with patient and/or surrogate as well as nursing, discussions with consultants, re-evaluation of the patient's condition and their response to treatment, examination of patient, obtaining history from patient or surrogate, ordering and performing treatments and interventions, ordering and review of laboratory studies, ordering and review of radiographic studies, and participation in multidisciplinary rounds.  Donnice JONELLE Beals, MD Pulmonary, Critical Care Medicine [facility]  7a-7p: For contact information, see AMION. If no response, call PCCM Consults pager. 7a-7p: Call E-Link.       [1]  Allergies Allergen Reactions   Statins Other (See Comments)    LEG CRAMPS   Latex Hives and Rash   Morphine And Codeine Hives   "

## 2024-07-05 ENCOUNTER — Ambulatory Visit
